# Patient Record
Sex: Male | Born: 1990 | Race: White | Hispanic: No | Smoking: Current every day smoker
Health system: Southern US, Community
[De-identification: ages and names within clinical notes are randomized; demographics above are authoritative.]

## PROBLEM LIST (undated history)

## (undated) DIAGNOSIS — B1920 Unspecified viral hepatitis C without hepatic coma: Secondary | ICD-10-CM

## (undated) DIAGNOSIS — J45909 Unspecified asthma, uncomplicated: Secondary | ICD-10-CM

## (undated) DIAGNOSIS — E11610 Type 2 diabetes mellitus with diabetic neuropathic arthropathy: Secondary | ICD-10-CM

## (undated) DIAGNOSIS — I10 Essential (primary) hypertension: Secondary | ICD-10-CM

## (undated) DIAGNOSIS — M86372 Chronic multifocal osteomyelitis, left ankle and foot: Secondary | ICD-10-CM

## (undated) DIAGNOSIS — N184 Chronic kidney disease, stage 4 (severe): Secondary | ICD-10-CM

## (undated) DIAGNOSIS — F1910 Other psychoactive substance abuse, uncomplicated: Secondary | ICD-10-CM

## (undated) DIAGNOSIS — A4902 Methicillin resistant Staphylococcus aureus infection, unspecified site: Secondary | ICD-10-CM

## (undated) DIAGNOSIS — Z87442 Personal history of urinary calculi: Secondary | ICD-10-CM

## (undated) DIAGNOSIS — N189 Chronic kidney disease, unspecified: Secondary | ICD-10-CM

## (undated) DIAGNOSIS — E1022 Type 1 diabetes mellitus with diabetic chronic kidney disease: Secondary | ICD-10-CM

## (undated) DIAGNOSIS — D649 Anemia, unspecified: Secondary | ICD-10-CM

## (undated) DIAGNOSIS — N2581 Secondary hyperparathyroidism of renal origin: Secondary | ICD-10-CM

## (undated) DIAGNOSIS — K219 Gastro-esophageal reflux disease without esophagitis: Secondary | ICD-10-CM

## (undated) DIAGNOSIS — E119 Type 2 diabetes mellitus without complications: Secondary | ICD-10-CM

## (undated) HISTORY — PX: APPENDECTOMY: SHX54

## (undated) HISTORY — PX: MYRINGOTOMY: SUR874

## (undated) HISTORY — DX: Chronic kidney disease, unspecified: N18.9

## (undated) HISTORY — PX: CYSTOSCOPY, WITH RETROGRADE PYELOGRAM, URETEROSCOPY, URINARY CALCULUS LASER LITHOTRIPSY, AND STENT INSERT: SHX7550

---

## 2013-10-31 DIAGNOSIS — E109 Type 1 diabetes mellitus without complications: Secondary | ICD-10-CM | POA: Insufficient documentation

## 2013-10-31 DIAGNOSIS — E1065 Type 1 diabetes mellitus with hyperglycemia: Secondary | ICD-10-CM | POA: Insufficient documentation

## 2016-08-15 DIAGNOSIS — Z87898 Personal history of other specified conditions: Secondary | ICD-10-CM | POA: Insufficient documentation

## 2016-08-15 DIAGNOSIS — F17209 Nicotine dependence, unspecified, with unspecified nicotine-induced disorders: Secondary | ICD-10-CM | POA: Insufficient documentation

## 2016-08-15 DIAGNOSIS — N201 Calculus of ureter: Secondary | ICD-10-CM | POA: Insufficient documentation

## 2017-08-30 HISTORY — PX: FOOT SURGERY: SHX648

## 2018-02-12 DIAGNOSIS — F1120 Opioid dependence, uncomplicated: Secondary | ICD-10-CM | POA: Insufficient documentation

## 2018-02-12 DIAGNOSIS — N179 Acute kidney failure, unspecified: Secondary | ICD-10-CM | POA: Insufficient documentation

## 2018-02-12 DIAGNOSIS — D649 Anemia, unspecified: Secondary | ICD-10-CM | POA: Insufficient documentation

## 2018-02-12 DIAGNOSIS — E08621 Diabetes mellitus due to underlying condition with foot ulcer: Secondary | ICD-10-CM | POA: Insufficient documentation

## 2018-05-25 DIAGNOSIS — B1920 Unspecified viral hepatitis C without hepatic coma: Secondary | ICD-10-CM | POA: Insufficient documentation

## 2018-05-26 DIAGNOSIS — D638 Anemia in other chronic diseases classified elsewhere: Secondary | ICD-10-CM | POA: Insufficient documentation

## 2018-05-26 DIAGNOSIS — I10 Essential (primary) hypertension: Secondary | ICD-10-CM | POA: Insufficient documentation

## 2018-05-26 DIAGNOSIS — E1042 Type 1 diabetes mellitus with diabetic polyneuropathy: Secondary | ICD-10-CM | POA: Insufficient documentation

## 2018-05-26 DIAGNOSIS — N189 Chronic kidney disease, unspecified: Secondary | ICD-10-CM | POA: Insufficient documentation

## 2019-01-03 DIAGNOSIS — M86172 Other acute osteomyelitis, left ankle and foot: Secondary | ICD-10-CM | POA: Insufficient documentation

## 2019-01-03 DIAGNOSIS — L089 Local infection of the skin and subcutaneous tissue, unspecified: Secondary | ICD-10-CM | POA: Insufficient documentation

## 2022-02-14 ENCOUNTER — Inpatient Hospital Stay
Admission: EM | Admit: 2022-02-14 | Discharge: 2022-02-17 | DRG: 074 | Disposition: A | Discharge status: Home / Self Care | Payer: Commercial Managed Care - HMO | Attending: Obstetrics and Gynecology | Admitting: Obstetrics and Gynecology

## 2022-02-14 ENCOUNTER — Emergency Department: Payer: Commercial Managed Care - HMO

## 2022-02-14 ENCOUNTER — Encounter: Payer: Self-pay | Admitting: Medical Oncology

## 2022-02-14 DIAGNOSIS — E1065 Type 1 diabetes mellitus with hyperglycemia: Secondary | ICD-10-CM | POA: Diagnosis present

## 2022-02-14 DIAGNOSIS — G8929 Other chronic pain: Secondary | ICD-10-CM | POA: Diagnosis present

## 2022-02-14 DIAGNOSIS — F1490 Cocaine use, unspecified, uncomplicated: Secondary | ICD-10-CM | POA: Diagnosis present

## 2022-02-14 DIAGNOSIS — Z881 Allergy status to other antibiotic agents status: Secondary | ICD-10-CM

## 2022-02-14 DIAGNOSIS — N179 Acute kidney failure, unspecified: Secondary | ICD-10-CM | POA: Diagnosis present

## 2022-02-14 DIAGNOSIS — E1043 Type 1 diabetes mellitus with diabetic autonomic (poly)neuropathy: Principal | ICD-10-CM | POA: Diagnosis present

## 2022-02-14 DIAGNOSIS — M86372 Chronic multifocal osteomyelitis, left ankle and foot: Secondary | ICD-10-CM | POA: Diagnosis present

## 2022-02-14 DIAGNOSIS — Z794 Long term (current) use of insulin: Secondary | ICD-10-CM

## 2022-02-14 DIAGNOSIS — F1120 Opioid dependence, uncomplicated: Secondary | ICD-10-CM | POA: Diagnosis present

## 2022-02-14 DIAGNOSIS — Z720 Tobacco use: Secondary | ICD-10-CM | POA: Diagnosis present

## 2022-02-14 DIAGNOSIS — K3184 Gastroparesis: Secondary | ICD-10-CM | POA: Diagnosis present

## 2022-02-14 DIAGNOSIS — E10610 Type 1 diabetes mellitus with diabetic neuropathic arthropathy: Secondary | ICD-10-CM | POA: Diagnosis present

## 2022-02-14 DIAGNOSIS — E43 Unspecified severe protein-calorie malnutrition: Secondary | ICD-10-CM | POA: Diagnosis present

## 2022-02-14 DIAGNOSIS — D631 Anemia in chronic kidney disease: Secondary | ICD-10-CM

## 2022-02-14 DIAGNOSIS — N184 Chronic kidney disease, stage 4 (severe): Secondary | ICD-10-CM | POA: Diagnosis present

## 2022-02-14 DIAGNOSIS — E1022 Type 1 diabetes mellitus with diabetic chronic kidney disease: Secondary | ICD-10-CM | POA: Diagnosis present

## 2022-02-14 DIAGNOSIS — R0789 Other chest pain: Secondary | ICD-10-CM | POA: Diagnosis present

## 2022-02-14 DIAGNOSIS — N189 Chronic kidney disease, unspecified: Secondary | ICD-10-CM | POA: Diagnosis present

## 2022-02-14 DIAGNOSIS — Z882 Allergy status to sulfonamides status: Secondary | ICD-10-CM

## 2022-02-14 DIAGNOSIS — R112 Nausea with vomiting, unspecified: Secondary | ICD-10-CM | POA: Diagnosis present

## 2022-02-14 DIAGNOSIS — R739 Hyperglycemia, unspecified: Secondary | ICD-10-CM

## 2022-02-14 DIAGNOSIS — I129 Hypertensive chronic kidney disease with stage 1 through stage 4 chronic kidney disease, or unspecified chronic kidney disease: Secondary | ICD-10-CM | POA: Diagnosis present

## 2022-02-14 DIAGNOSIS — E109 Type 1 diabetes mellitus without complications: Secondary | ICD-10-CM | POA: Diagnosis present

## 2022-02-14 DIAGNOSIS — F17210 Nicotine dependence, cigarettes, uncomplicated: Secondary | ICD-10-CM | POA: Diagnosis present

## 2022-02-14 DIAGNOSIS — E871 Hypo-osmolality and hyponatremia: Secondary | ICD-10-CM | POA: Diagnosis present

## 2022-02-14 DIAGNOSIS — M868X7 Other osteomyelitis, ankle and foot: Principal | ICD-10-CM

## 2022-02-14 DIAGNOSIS — F1290 Cannabis use, unspecified, uncomplicated: Secondary | ICD-10-CM | POA: Diagnosis present

## 2022-02-14 DIAGNOSIS — M14672 Charcot's joint, left ankle and foot: Secondary | ICD-10-CM

## 2022-02-14 DIAGNOSIS — E1069 Type 1 diabetes mellitus with other specified complication: Secondary | ICD-10-CM

## 2022-02-14 DIAGNOSIS — F1190 Opioid use, unspecified, uncomplicated: Secondary | ICD-10-CM

## 2022-02-14 DIAGNOSIS — E875 Hyperkalemia: Secondary | ICD-10-CM | POA: Diagnosis present

## 2022-02-14 HISTORY — DX: Nausea with vomiting, unspecified: R11.2

## 2022-02-14 HISTORY — DX: Essential (primary) hypertension: I10

## 2022-02-14 HISTORY — DX: Type 2 diabetes mellitus without complications: E11.9

## 2022-02-14 LAB — CBC WITH DIFFERENTIAL/PLATELET
Abs Immature Granulocytes: 0.06 10*3/uL (ref 0.00–0.07)
Basophils Absolute: 0 10*3/uL (ref 0.0–0.1)
Basophils Relative: 0 %
Eosinophils Absolute: 0 10*3/uL (ref 0.0–0.5)
Eosinophils Relative: 0 %
HCT: 31.7 % — ABNORMAL LOW (ref 39.0–52.0)
Hemoglobin: 9.7 g/dL — ABNORMAL LOW (ref 13.0–17.0)
Immature Granulocytes: 1 %
Lymphocytes Relative: 12 %
Lymphs Abs: 1.1 10*3/uL (ref 0.7–4.0)
MCH: 25.9 pg — ABNORMAL LOW (ref 26.0–34.0)
MCHC: 30.6 g/dL (ref 30.0–36.0)
MCV: 84.8 fL (ref 80.0–100.0)
Monocytes Absolute: 0.3 10*3/uL (ref 0.1–1.0)
Monocytes Relative: 3 %
Neutro Abs: 8.1 10*3/uL — ABNORMAL HIGH (ref 1.7–7.7)
Neutrophils Relative %: 84 %
Platelets: 384 10*3/uL (ref 150–400)
RBC: 3.74 MIL/uL — ABNORMAL LOW (ref 4.22–5.81)
RDW: 14.8 % (ref 11.5–15.5)
WBC: 9.7 10*3/uL (ref 4.0–10.5)
nRBC: 0 % (ref 0.0–0.2)

## 2022-02-14 LAB — URINALYSIS, ROUTINE W REFLEX MICROSCOPIC
Bilirubin Urine: NEGATIVE
Glucose, UA: >=500 mg/dL — AB
Ketones, ur: NEGATIVE mg/dL
Nitrite: NEGATIVE
Protein, ur: 100 mg/dL — AB
Specific Gravity, Urine: 1.007 (ref 1.005–1.030)
Squamous Epithelial / HPF: NONE SEEN (ref 0–5)
pH: 7 (ref 5.0–8.0)

## 2022-02-14 LAB — CBG MONITORING, ED
Glucose-Capillary: 343 mg/dL — ABNORMAL HIGH (ref 70–99)
Glucose-Capillary: 303 mg/dL — ABNORMAL HIGH (ref 70–99)
Glucose-Capillary: 217 mg/dL — ABNORMAL HIGH (ref 70–99)

## 2022-02-14 LAB — COMPREHENSIVE METABOLIC PANEL
ALT: 24 U/L (ref 0–44)
AST: 32 U/L (ref 15–41)
Albumin: 3.2 g/dL — ABNORMAL LOW (ref 3.5–5.0)
Alkaline Phosphatase: 132 U/L — ABNORMAL HIGH (ref 38–126)
Anion gap: 12 (ref 5–15)
BUN: 40 mg/dL — ABNORMAL HIGH (ref 6–20)
CO2: 19 mmol/L — ABNORMAL LOW (ref 22–32)
Calcium: 8.9 mg/dL (ref 8.9–10.3)
Chloride: 100 mmol/L (ref 98–111)
Creatinine, Ser: 3.74 mg/dL — ABNORMAL HIGH (ref 0.61–1.24)
GFR, Estimated: 21 mL/min — ABNORMAL LOW (ref >60)
Glucose, Bld: 375 mg/dL — ABNORMAL HIGH (ref 70–99)
Potassium: 5.4 mmol/L — ABNORMAL HIGH (ref 3.5–5.1)
Sodium: 131 mmol/L — ABNORMAL LOW (ref 135–145)
Total Bilirubin: 0.7 mg/dL (ref 0.3–1.2)
Total Protein: 9.9 g/dL — ABNORMAL HIGH (ref 6.5–8.1)

## 2022-02-14 LAB — URINALYSIS, COMPLETE (UACMP) WITH MICROSCOPIC
Bilirubin Urine: NEGATIVE
Glucose, UA: >=500 mg/dL — AB
Ketones, ur: NEGATIVE mg/dL
Nitrite: NEGATIVE
Protein, ur: 100 mg/dL — AB
Specific Gravity, Urine: 1.007 (ref 1.005–1.030)
Squamous Epithelial / HPF: NONE SEEN (ref 0–5)
pH: 7 (ref 5.0–8.0)

## 2022-02-14 LAB — URINE DRUG SCREEN, QUALITATIVE (ARMC ONLY)
Amphetamines, Ur Screen: NOT DETECTED
Barbiturates, Ur Screen: NOT DETECTED
Benzodiazepine, Ur Scrn: NOT DETECTED
Cannabinoid 50 Ng, Ur ~~LOC~~: POSITIVE — AB
Cocaine Metabolite,Ur ~~LOC~~: POSITIVE — AB
MDMA (Ecstasy)Ur Screen: NOT DETECTED
Methadone Scn, Ur: POSITIVE — AB
Opiate, Ur Screen: NOT DETECTED
Phencyclidine (PCP) Ur S: NOT DETECTED
Tricyclic, Ur Screen: NOT DETECTED

## 2022-02-14 LAB — C-REACTIVE PROTEIN: CRP: 2.9 mg/dL — ABNORMAL HIGH (ref <1.0)

## 2022-02-14 LAB — LIPASE, BLOOD: Lipase: 23 U/L (ref 11–51)

## 2022-02-14 LAB — SEDIMENTATION RATE: Sed Rate: 109 mm/hr — ABNORMAL HIGH (ref 0–16)

## 2022-02-14 LAB — PROCALCITONIN: Procalcitonin: 0.25 ng/mL

## 2022-02-14 LAB — MRSA NEXT GEN BY PCR, NASAL: MRSA by PCR Next Gen: NOT DETECTED

## 2022-02-14 LAB — LACTIC ACID, PLASMA: Lactic Acid, Venous: 2.1 mmol/L (ref 0.5–1.9)

## 2022-02-14 LAB — TROPONIN I (HIGH SENSITIVITY)
Troponin I (High Sensitivity): 7 ng/L (ref <18)
Troponin I (High Sensitivity): 8 ng/L (ref <18)

## 2022-02-14 MED ORDER — ONDANSETRON HCL 4 MG PO TABS: 4.0000 mg | ORAL_TABLET | Freq: Four times a day (QID) | ORAL | Status: AC | PRN

## 2022-02-14 MED ORDER — PIPERACILLIN-TAZOBACTAM 3.375 G IVPB 30 MIN
3.3750 g | Freq: Once | INTRAVENOUS | Status: DC
  Filled 2022-02-14: qty 50

## 2022-02-14 MED ORDER — METHADONE HCL 10 MG PO TABS
90.0000 mg | ORAL_TABLET | Freq: Once | ORAL | Status: DC
  Filled 2022-02-14: qty 9

## 2022-02-14 MED ORDER — METHADONE HCL 10 MG PO TABS
40.0000 mg | ORAL_TABLET | Freq: Once | ORAL | Status: AC
  Administered 2022-02-14: 40 mg via ORAL

## 2022-02-14 MED ORDER — LACTATED RINGERS IV BOLUS
1000.0000 mL | Freq: Once | INTRAVENOUS | Status: AC
  Administered 2022-02-14: 1000 mL via INTRAVENOUS

## 2022-02-14 MED ORDER — INSULIN ASPART 100 UNIT/ML IJ SOLN
0.0000 [IU] | Freq: Three times a day (TID) | INTRAMUSCULAR | Status: DC
  Administered 2022-02-14: 7 [IU] via SUBCUTANEOUS
  Administered 2022-02-15: 3 [IU] via SUBCUTANEOUS
  Administered 2022-02-15: 5 [IU] via SUBCUTANEOUS
  Administered 2022-02-15: 3 [IU] via SUBCUTANEOUS
  Filled 2022-02-14 (×3): qty 1

## 2022-02-14 MED ORDER — ONDANSETRON HCL 4 MG/2ML IJ SOLN
4.0000 mg | Freq: Four times a day (QID) | INTRAMUSCULAR | Status: AC | PRN
  Administered 2022-02-14 – 2022-02-16 (×5): 4 mg via INTRAVENOUS
  Filled 2022-02-14 (×5): qty 2

## 2022-02-14 MED ORDER — METOCLOPRAMIDE HCL 5 MG/ML IJ SOLN
10.0000 mg | Freq: Once | INTRAMUSCULAR | Status: AC
  Administered 2022-02-14: 10 mg via INTRAVENOUS
  Filled 2022-02-14: qty 2

## 2022-02-14 MED ORDER — PROMETHAZINE HCL 25 MG/ML IJ SOLN
25.0000 mg | Freq: Once | INTRAMUSCULAR | Status: AC
  Administered 2022-02-14: 25 mg via INTRAMUSCULAR
  Filled 2022-02-14 (×2): qty 1

## 2022-02-14 MED ORDER — HEPARIN SODIUM (PORCINE) 5000 UNIT/ML IJ SOLN
5000.0000 [IU] | Freq: Three times a day (TID) | INTRAMUSCULAR | Status: AC
  Filled 2022-02-14: qty 1

## 2022-02-14 MED ORDER — ACETAMINOPHEN 650 MG RE SUPP: 650.0000 mg | Freq: Four times a day (QID) | RECTAL | Status: DC | PRN

## 2022-02-14 MED ORDER — SODIUM CHLORIDE 0.9 % IV BOLUS
1000.0000 mL | Freq: Once | INTRAVENOUS | Status: AC
  Administered 2022-02-14: 1000 mL via INTRAVENOUS

## 2022-02-14 MED ORDER — ACETAMINOPHEN 325 MG PO TABS: 650.0000 mg | ORAL_TABLET | Freq: Four times a day (QID) | ORAL | Status: DC | PRN

## 2022-02-14 MED ORDER — VANCOMYCIN HCL 1500 MG/300ML IV SOLN
1500.0000 mg | Freq: Once | INTRAVENOUS | Status: AC
  Administered 2022-02-14: 1500 mg via INTRAVENOUS
  Filled 2022-02-14: qty 300

## 2022-02-14 MED ORDER — NICOTINE 21 MG/24HR TD PT24: 21.0000 mg | MEDICATED_PATCH | Freq: Every day | TRANSDERMAL | Status: DC | PRN

## 2022-02-14 MED ORDER — INSULIN ASPART 100 UNIT/ML IJ SOLN
0.0000 [IU] | Freq: Every day | INTRAMUSCULAR | Status: DC
  Administered 2022-02-14: 2 [IU] via SUBCUTANEOUS
  Filled 2022-02-14: qty 1

## 2022-02-14 MED ORDER — SODIUM CHLORIDE 0.9 % IV SOLN
2.0000 g | INTRAVENOUS | Status: DC
  Administered 2022-02-14 – 2022-02-15 (×2): 2 g via INTRAVENOUS
  Filled 2022-02-14 (×3): qty 12.5

## 2022-02-14 MED ORDER — ONDANSETRON 4 MG PO TBDP
4.0000 mg | ORAL_TABLET | Freq: Once | ORAL | Status: AC
Start: 2022-02-14 — End: 2022-02-14
  Administered 2022-02-14: 4 mg via ORAL
  Filled 2022-02-14: qty 1

## 2022-02-14 MED ORDER — HYDROMORPHONE HCL 1 MG/ML IJ SOLN
0.5000 mg | INTRAMUSCULAR | Status: DC | PRN
  Administered 2022-02-15: 0.5 mg via INTRAVENOUS
  Filled 2022-02-14: qty 0.5

## 2022-02-14 MED ORDER — HYDRALAZINE HCL 25 MG PO TABS
25.0000 mg | ORAL_TABLET | Freq: Four times a day (QID) | ORAL | Status: DC | PRN
  Administered 2022-02-14 – 2022-02-15 (×4): 25 mg via ORAL
  Filled 2022-02-14 (×4): qty 1

## 2022-02-14 MED ORDER — METOCLOPRAMIDE HCL 5 MG/ML IJ SOLN
10.0000 mg | Freq: Three times a day (TID) | INTRAMUSCULAR | Status: DC | PRN
Start: 2022-02-14 — End: 2022-02-17
  Administered 2022-02-14 – 2022-02-16 (×4): 10 mg via INTRAVENOUS
  Filled 2022-02-14 (×4): qty 2

## 2022-02-14 MED ORDER — MORPHINE SULFATE (PF) 2 MG/ML IV SOLN
2.0000 mg | INTRAVENOUS | Status: DC | PRN
  Administered 2022-02-15: 2 mg via INTRAVENOUS
  Filled 2022-02-14: qty 1

## 2022-02-14 MED ORDER — METOPROLOL TARTRATE 5 MG/5ML IV SOLN: 5.0000 mg | INTRAVENOUS | Status: DC | PRN

## 2022-02-14 MED ORDER — SODIUM CHLORIDE 0.9 % IV SOLN: INTRAVENOUS | Status: AC

## 2022-02-14 NOTE — Assessment & Plan Note (Signed)
-   Presumed secondary to AKI - Treat with fluid as above - BMP in a.m.

## 2022-02-14 NOTE — ED Notes (Signed)
Unable to obtain 2nd set of blood cultures at this time due to difficult stick.

## 2022-02-14 NOTE — ED Triage Notes (Signed)
Pt here with mother, pt reports that he began having vomiting this am with some chest pain.

## 2022-02-14 NOTE — ED Notes (Signed)
Pt c/o of nausea and vomitting. Medication will be given

## 2022-02-14 NOTE — Assessment & Plan Note (Addendum)
-   Query gastroparesis versus gastroenteritis - Intractable - Lipase was negative - Symptomatic support with ondansetron as needed; Reglan for 10 mg IV for refractory nausea and vomiting - If the symptoms do not resolve, would recommend a barium swallow study either inpatient or outpatient to assess for gastroparesis

## 2022-02-14 NOTE — ED Notes (Signed)
Informed RN bed assigned 

## 2022-02-14 NOTE — Assessment & Plan Note (Signed)
Nicotine patch ordered.

## 2022-02-14 NOTE — H&P (Addendum)
History and Physical   Jason Francis CHE:[id number] DOB: 1990-10-28 DOA: 02/14/2022  PCP: Pcp, No  Patient coming from: home  I have personally briefly reviewed patient's old medical records in [place].  Chief Concern: nausea and vomiting  HPI: Mr. Jason Francis is a 31 year old male with history of insulin-dependent diabetes mellitus type 1, who presented to the emergency department for chief concerns of nausea and vomiting.  Initial vitals in the emergency department showed temperature of 97.7, respiration rate of 20, heart rate of 77, initial blood pressure 212/114, improved to 197/102, SPO2 of 100% on room air.  Serum sodium is 131, potassium 5.4, chloride 100, bicarb 19, BUN of 40, serum creatinine of 3.74, nonfasting blood glucose 175, GFR of 21.  WBC 9.7, hemoglobin 9.7, platelets of 384.  UA was positive for leukocytes and nitrates.  ED treatment: Vancomycin, Zosyn IV, Reglan 10 mg IV, ondansetron 4 mg p.o., sodium chloride 2 L bolus, LR 1 L bolus.  At bedside patient was able to tell me his name, age, he knows the current calendar year and he knows his mother is at bedside.  He reports that he is coming in for nausea and vomiting that happened today.  He does not know what he is vomiting up.  He is minimally verbal and does not want to make eye contact with me.  He prefers that his mother talk for him.  He appears mildly impatient and agitated at my questions in order to complete the HPI.  He does not appear to be in acute distress.  Patient and family denies anybody else getting sick.  They just got back from the beach and had seafood though no one in the family had nausea or vomiting.  He denies dysuria, diarrhea.  He reports that his left ankle has been bothering him for a long time now nearly a year and was advised to have it amputated however they were not ready at that time.  Patient endorses requests to have orthopedic evaluation for second opinion.  He also  endorses subjective fever at home though they were not able to tell me the temperature.  Social history: He is a current daily tobacco user smoking 1 to 2 packs/day.  He denies alcohol use.  He endorses marijuana and cocaine use.  He states he last injected cocaine on 02/13/2022.  ROS: Unable to complete as patient did not want to participate in HPI  ED Course: Discussed with emergency medicine provider, patient requiring hospitalization for chief concerns of acute kidney injury and osteomyelitis.  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)   Insulin dependent type 1 diabetes mellitus (HCC)   AKI (acute kidney injury) ([place])   Protein-calorie malnutrition, severe (HCC)   Hyponatremia   Cocaine use   Tobacco use   Hyperkalemia   Assessment and Plan:  * Nausea & vomiting - Query gastroparesis versus gastroenteritis - Intractable - Lipase was negative - Symptomatic support with ondansetron as needed; Reglan for 10 mg IV for refractory nausea and vomiting - If the symptoms do not resolve, would recommend a barium swallow study either inpatient or outpatient to assess for gastroparesis  Hyperkalemia - Presumed secondary to AKI - Treat with fluid as above - BMP in a.m.  Tobacco use - Nicotine patch ordered  Cocaine use - Intravenous - And patient endorses fever - Check blood cultures  Hyponatremia - Secondary to hyperglycemia - Corrected serum  sodium is 138, ([place], 1999)  Protein-calorie malnutrition, severe ([place]) - Moderate to severe  AKI (acute kidney injury) ([place]) Likely prerenal secondary to GI loss versus CKD stage IV - Serum creatinine on presentation is 3.74 and eGFR is 21 - There is no baseline in epic and in care everywhere for comparison - Status post sodium chloride bolus 1 L and LR 1 L per EDP - Ordered sodium chloride 125 mL/h, 1 day ordered - BMP in the  a.m.  Insulin dependent type 1 diabetes mellitus (HCC) - Insulin SSI with at bedtime coverage, renal dosing ordered - Patient states that at home he takes insulin short acting, 3 to 4 units, 3 times per day - Check A1c in the a.m. 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 Sharlet Salina  Chart reviewed.   DVT prophylaxis: Heparin 5000 units subcutaneous every 8 hours, 2 doses ordered Code Status: Full code Diet: Heart healthy/carb modified Family Communication: Updated mother and fianc at bedside with patient's permission Disposition Plan: Pending clinical course Consults called: Orthopedic provider Admission status: MedSurg, observation  Past Medical History:  Diagnosis Date   Diabetes mellitus without complication ([place])    Hypertension    History reviewed. No pertinent surgical history.  Social History:  reports that he has been smoking cigarettes. He has been smoking an average of 2 packs per day. He has never used smokeless tobacco. He reports that he does not currently use alcohol. He reports current drug use. Drugs: Marijuana and Cocaine.  Allergies  Allergen Reactions   Augmentin [Amoxicillin-Pot Clavulanate] Nausea And Vomiting   Sulfa Antibiotics Nausea And Vomiting   Family History  Problem Relation Age of Onset   Obesity Mother    Family history: Family history reviewed and not pertinent.  Prior to Admission medications   Medication Sig Start Date End Date Taking? Authorizing Provider  insulin regular (NOVOLIN R) 100 units/mL injection Inject 4-5 Units into the skin 3 (three) times daily before meals.   Yes [provider]   Physical Exam: Vitals:   02/14/22 1031 02/14/22  1032 02/14/22 1458 02/14/22 1500  BP: (!) 212/114  (!) 197/102 (!) 189/105  Pulse:   90 96  Resp:   16   Temp:      TempSrc:      SpO2:   99% 99%  Weight:  77 kg    Height:  $Remove'5\' 8"'DgWEfWG$  (1.727 m)     Constitutional: appears older than chronological age, NAD, calm, comfortable Eyes: PERRL, lids and conjunctivae normal ENMT: Mucous membranes are moist. Posterior pharynx clear of any exudate or lesions.  Poor dentition. Hearing appropriate Neck: normal, supple, no masses, no thyromegaly Respiratory: clear to auscultation bilaterally, no wheezing, no crackles. Normal respiratory effort. No accessory muscle use.  Cardiovascular: Regular rate and rhythm, no murmurs / rubs / gallops. No extremity edema. 2+ pedal pulses. No carotid bruits.  Abdomen: no tenderness, no masses palpated, no hepatosplenomegaly. Bowel sounds positive.  Musculoskeletal: no clubbing / cyanosis. No joint deformity upper and lower extremities. Good ROM except for the left ankle, no contractures, no atrophy. Normal muscle tone.      Skin: no rashes, lesions, ulcers. No induration Neurologic: Sensation intact. Strength 5/5 in all 4.  Psychiatric:  Normal judgment and insight. Alert and oriented x 3. Normal mood.   EKG: independently reviewed, showing sinus rhythm with rate of 69, QTc 422  Chest x-ray on Admission: I personally reviewed and I agree with radiologist reading as below.  DG Ankle Complete Left  Result Date: 02/14/2022 CLINICAL DATA:  Diabetic with swelling EXAM: LEFT ANKLE COMPLETE - 3+ VIEW; LEFT FOOT - COMPLETE 3+ VIEW COMPARISON:  None Available. FINDINGS: There is extensive marked bone destruction of the distal tibia, fibula and talus with complete obliteration of the ankle joint. Similar findings of bone destruction and distortion throughout the midfoot involving the anterior half of the calcaneus to the proximal metatarsals. Extensive associated callus formation/periosteal reaction about the ankle. Severe soft  tissue swelling with deformity of the ankle and foot. Arterial vascular calcifications noted. 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 Electronically Signed   By: Ofilia Neas M.D.   On: 02/14/2022 13:13   DG Foot Complete Left  Result Date: 02/14/2022 CLINICAL DATA:  Diabetic with swelling EXAM: LEFT ANKLE COMPLETE - 3+ VIEW; LEFT FOOT - COMPLETE 3+ VIEW COMPARISON:  None Available. FINDINGS: There is extensive marked bone destruction of the distal tibia, fibula and talus with complete obliteration of the ankle joint. Similar findings of bone destruction and distortion throughout the midfoot involving the anterior half of the calcaneus to the proximal metatarsals. Extensive associated callus formation/periosteal reaction about the ankle. Severe soft tissue swelling with deformity of the ankle and foot. Arterial vascular calcifications noted. 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 Electronically Signed   By: Ofilia Neas M.D.   On: 02/14/2022 13:13   DG Chest Portable 1 View  Result Date: 02/14/2022 CLINICAL DATA:  31 year old male with history of chest pain and shortness of breath. EXAM: PORTABLE CHEST 1 VIEW COMPARISON:  No priors. FINDINGS: Lung volumes are low. Diffuse peribronchial cuffing. No consolidative airspace disease. No pleural effusions. No pneumothorax. No pulmonary nodule or mass noted. Pulmonary vasculature and the cardiomediastinal silhouette are within normal limits. IMPRESSION: 1. Diffuse peribronchial cuffing which may suggest an acute bronchitis. Electronically Signed   By: Vinnie Langton M.D.   On: 02/14/2022 13:00    Labs on Admission: I have personally reviewed following labs  CBC: Recent Labs  Lab 02/14/22 1102  WBC 9.7  NEUTROABS 8.1*  HGB 9.7*  HCT 31.7*  MCV 84.8  PLT 834   Basic Metabolic Panel: Recent Labs  Lab 02/14/22 1102  NA 131*  K  5.4*  CL 100  CO2 19*  GLUCOSE 375*  BUN 40*  CREATININE 3.74*  CALCIUM 8.9   GFR: Estimated Creatinine Clearance: 27.4 mL/min (A) (by C-G formula based on SCr of 3.74 mg/dL (H)).  Liver Function Tests: Recent Labs  Lab 02/14/22 1102  AST 32  ALT 24  ALKPHOS 132*  BILITOT 0.7  PROT 9.9*  ALBUMIN 3.2*   Recent Labs  Lab 02/14/22 1102  LIPASE 23   CBG: Recent Labs  Lab 02/14/22 1031 02/14/22 1631  GLUCAP 343* 303*   Urine analysis:    Component Value Date/Time   COLORURINE STRAW (A) 02/14/2022 1413   APPEARANCEUR CLEAR (A) 02/14/2022 1413   LABSPEC 1.007 02/14/2022 1413   PHURINE 7.0 02/14/2022 1413   GLUCOSEU >=500 (A) 02/14/2022 1413   HGBUR MODERATE (A) 02/14/2022 1413   BILIRUBINUR NEGATIVE 02/14/2022 1413   KETONESUR NEGATIVE 02/14/2022 1413   PROTEINUR 100 (A) 02/14/2022 1413  NITRITE NEGATIVE 02/14/2022 1413   LEUKOCYTESUR TRACE (A) 02/14/2022 1413   Dr. Tobie Poet [facility]  If 7PM-7AM, please contact overnight-coverage provider If 7AM-7PM, please contact day coverage provider [email or web address]  02/14/2022, 6:13 PM

## 2022-02-14 NOTE — Progress Notes (Signed)
Secure chat with Minette Brine RN and Erlene Quan RN re 2nd PIV site.  Determined it is not needed at this time.

## 2022-02-14 NOTE — Assessment & Plan Note (Signed)
-   Moderate to severe

## 2022-02-14 NOTE — Hospital Course (Addendum)
Jason Francis is a 31 year old male with history of insulin-dependent diabetes mellitus type 1, who presented to the emergency department for chief concerns of nausea and vomiting.  Initial vitals in the emergency department showed temperature of 97.7, respiration rate of 20, heart rate of 77, initial blood pressure 212/114, improved to 197/102, SPO2 of 100% on room air.  Serum sodium is 131, potassium 5.4, chloride 100, bicarb 19, BUN of 40, serum creatinine of 3.74, nonfasting blood glucose 175, GFR of 21.  WBC 9.7, hemoglobin 9.7, platelets of 384.  UA was positive for leukocytes and nitrates.  ED treatment: Vancomycin, Zosyn IV, Reglan 10 mg IV, ondansetron 4 mg p.o., sodium chloride 2 L bolus, LR 1 L bolus.

## 2022-02-14 NOTE — Assessment & Plan Note (Addendum)
Likely prerenal secondary to GI loss versus CKD stage IV - Serum creatinine on presentation is 3.74 and eGFR is 21 - There is no baseline in epic and in care everywhere for comparison - Status post sodium chloride bolus 1 L and LR 1 L per EDP - Ordered sodium chloride 125 mL/h, 1 day ordered - BMP in the a.m.

## 2022-02-14 NOTE — Assessment & Plan Note (Addendum)
-   Insulin SSI with at bedtime coverage, renal dosing ordered - Patient states that at home he takes insulin short acting, 3 to 4 units, 3 times per day - Check A1c in the a.m. - Goal inpatient blood glucose levels 140-180

## 2022-02-14 NOTE — Assessment & Plan Note (Signed)
-   Intravenous - And patient endorses fever - Check blood cultures

## 2022-02-14 NOTE — Consult Note (Signed)
ORTHOPAEDIC CONSULTATION  REQUESTING PHYSICIAN: Cox, Amy N, DO  Chief Complaint: left foot pain/deformity  HPI: Jason Francis is a 31 y.o. male who is being admitted for nausea and vomiting.   Orthopedics is being consulted regarding complaints of chronic left foot pain. The pain is sharp in character. Patient ambulates with crutches.  Per report from the patient's family, the patient was previously seen by an orthopedic provider at [facility] who recommended amputation. He has not had any redness, excessive swelling, or drainage for over 6 months.  Past Medical History:  Diagnosis Date   Diabetes mellitus without complication ([place])    Hypertension    History reviewed. No pertinent surgical history. Social History   Socioeconomic History   Marital status: Single    Spouse name: Not on file   Number of children: Not on file   Years of education: Not on file   Highest education level: Not on file  Occupational History   Not on file  Tobacco Use   Smoking status: Every Day    Packs/day: 2.00    Types: Cigarettes   Smokeless tobacco: Never  Substance and Sexual Activity   Alcohol use: Not Currently   Drug use: Yes    Types: Marijuana, Cocaine    Comment: patient states he last injected cocaine 02/13/22   Sexual activity: Yes    Partners: Female  Other Topics Concern   Not on file  Social History Narrative   Not on file   Social Determinants of Health   Financial Resource Strain: Not on file  Food Insecurity: Not on file  Transportation Needs: Not on file  Physical Activity: Not on file  Stress: Not on file  Social Connections: Not on file   Family History  Problem Relation Age of Onset   Obesity Mother    Allergies  Allergen Reactions   Augmentin [Amoxicillin-Pot Clavulanate] Nausea And Vomiting   Sulfa Antibiotics Nausea And Vomiting   Prior to Admission medications   Medication Sig Start Date End Date Taking? Authorizing Provider  insulin regular  (NOVOLIN R) 100 units/mL injection Inject 4-5 Units into the skin 3 (three) times daily before meals.   Yes [provider]   DG Ankle Complete Left  Result Date: 02/14/2022 CLINICAL DATA:  Diabetic with swelling EXAM: LEFT ANKLE COMPLETE - 3+ VIEW; LEFT FOOT - COMPLETE 3+ VIEW COMPARISON:  None Available. FINDINGS: There is extensive marked bone destruction of the distal tibia, fibula and talus with complete obliteration of the ankle joint. Similar findings of bone destruction and distortion throughout the midfoot involving the anterior half of the calcaneus to the proximal metatarsals. Extensive associated callus formation/periosteal reaction about the ankle. Severe soft tissue swelling with deformity of the ankle and foot. Arterial vascular calcifications noted. 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 Electronically Signed   By: Ofilia Neas M.D.   On: 02/14/2022 13:13   DG Foot Complete Left  Result Date: 02/14/2022 CLINICAL DATA:  Diabetic with swelling EXAM: LEFT ANKLE COMPLETE - 3+ VIEW; LEFT FOOT - COMPLETE 3+ VIEW COMPARISON:  None Available. FINDINGS: There is extensive marked bone destruction of the distal tibia, fibula and talus with complete obliteration of the ankle joint. Similar findings of bone destruction and distortion throughout the midfoot involving the anterior half of the calcaneus to the proximal metatarsals. Extensive associated callus formation/periosteal reaction about the ankle. Severe soft tissue swelling with deformity of the ankle and foot. Arterial vascular calcifications noted.  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 Electronically Signed   By: Ofilia Neas M.D.   On: 02/14/2022 13:13   DG Chest Portable 1 View  Result Date: 02/14/2022 CLINICAL DATA:  31 year old male with history of chest pain and shortness of breath. EXAM: PORTABLE CHEST 1  VIEW COMPARISON:  No priors. FINDINGS: Lung volumes are low. Diffuse peribronchial cuffing. No consolidative airspace disease. No pleural effusions. No pneumothorax. No pulmonary nodule or mass noted. Pulmonary vasculature and the cardiomediastinal silhouette are within normal limits. IMPRESSION: 1. Diffuse peribronchial cuffing which may suggest an acute bronchitis. Electronically Signed   By: Vinnie Langton M.D.   On: 02/14/2022 13:00    Positive ROS: All other systems have been reviewed and were otherwise negative with the exception of those mentioned in the HPI and as above.  Physical Exam: General: Alert, no acute distress, not interactive Cardiovascular: No pedal edema Respiratory: No cyanosis, no use of accessory musculature GI: No organomegaly, abdomen is soft and non-tender Skin: No lesions in the area of chief complaint Neurologic: Sensation intact distally Psychiatric: Patient is competent for consent with normal mood and affect Lymphatic: No axillary or cervical lymphadenopathy  MUSCULOSKELETAL:  Obvious deformity of the left foot/ankle, skin is intact, no swelling, no tenderness to palpation, able to dorsiflex/plantarflex the ankle, wiggles toes appropriately, endorses sensation throughout the foot  Assessment: 31yo male with IDDM with chronic left foot/ankle pain secondary to Charcot arthropathy  Plan: I discussed with the family that the patient's deformity is related to secondary effects from his diabetes. At this time, he does not appear to have any active infection. Per family report, there may have been a history of infection/osteomyelitis. Regardless, his left foot pain is not an urgent concern with respect to his admission and overall presentation. I recommend that he follow-up on an outpatient basis with podiatry or a foot and ankle specialist with expertise in Charcot arthropathy for discussion of further treatment options.    Renee Harder,  MD    02/14/2022 8:17 PM

## 2022-02-14 NOTE — Assessment & Plan Note (Addendum)
-   Check MRSA PCR, sed rate, CRP - EDP ordered vancomycin and Zosyn - Given patient's renal function, I have discontinued Zosyn - Continue Vanco and cefepime - Orthopedic has been consulted at patient and family's request, Dr. Sharlet Salina

## 2022-02-14 NOTE — ED Provider Triage Note (Signed)
Emergency Medicine Provider Triage Evaluation Note  Pax Reasoner , a 31 y.o. male  was evaluated in triage.  Pt complains of vomiting, cp, infection to foot, hx diabetes on insulin .  Review of Systems  Positive: As above Negative: fever  Physical Exam  BP (!) 211/114 (BP Location: Right Arm)   Pulse 77   Temp 97.7 F (36.5 C) (Oral)   Resp 20   Ht 5\' 8"  (1.727 m)   Wt 77.1 kg   SpO2 100%   BMI 25.85 kg/m  Gen:   Awake, no distress   Resp:  Normal effort  MSK:   Left foot and ankle with swelling and deformity Other:    Medical Decision Making  Medically screening exam initiated at 10:29 AM.  Appropriate orders placed.  Kishon Garriga was informed that the remainder of the evaluation will be completed by another provider, this initial triage assessment does not replace that evaluation, and the importance of remaining in the ED until their evaluation is complete.  Labs ordered   Versie Starks, PA-C 02/14/22 1043

## 2022-02-14 NOTE — Assessment & Plan Note (Signed)
-   Secondary to hyperglycemia - Corrected serum sodium is 138, ([place], [place])

## 2022-02-14 NOTE — Progress Notes (Signed)
Pharmacy Antibiotic Note  Jason Francis is a 31 y.o. male admitted on 02/14/2022 with chronic  osteomyelitis .  PMH includes T1DM. Previous surgery on left foot a few years ago with possible hardware and/or osteomyelitis and reportedly recurring over past few months. Pharmacy has been consulted for vancomycin and cefepime dosing  Hypertensive in the ED. Scr elevated though baseline is unknown.   Plan:  Vancomycin 1500 mg loading dose Plan to dose vancomycin per levels due to unstable renal function 24 hour random vanc level  Cefepime 2 grams every 24 hours  Follow up Scr in the AM.   Height: 5\' 8"  (172.7 cm) Weight: 77 kg (169 lb 12.1 oz) IBW/kg (Calculated) : 68.4  Temp (24hrs), Avg:97.7 F (36.5 C), Min:97.7 F (36.5 C), Max:97.7 F (36.5 C)  Recent Labs  Lab 02/14/22 1102  WBC 9.7  CREATININE 3.74*  LATICACIDVEN 2.1*    Estimated Creatinine Clearance: 27.4 mL/min (A) (by C-G formula based on SCr of 3.74 mg/dL (H)).    Allergies  Allergen Reactions   Augmentin [Amoxicillin-Pot Clavulanate] Nausea And Vomiting   Sulfa Antibiotics Nausea And Vomiting    Antimicrobials this admission: Vancomycin 6/18 >>  Piperacillin/tazobactam 6/18 >> 6/18 Cefepime 6/18 >>   Dose adjustments this admission: N/a  Microbiology results: 6/18 MRSA PCR: ordered  Thank you for allowing pharmacy to be a part of this patient's care.  Wynelle Cleveland, PharmD Pharmacy Resident  02/14/2022 2:06 PM

## 2022-02-14 NOTE — ED Provider Notes (Signed)
[facility] Provider Note    Event Date/Time   First MD Initiated Contact with Patient 02/14/22 1149     (approximate)   History   Chest Pain and Vomiting   HPI  Jason Francis is a 31 y.o. male who presents to the ED for evaluation of Chest Pain and Vomiting   Patient self-reports a history of type 1 diabetes on sliding scale insulin only.  No long-acting insulin or pumps. 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  Patient resents to the ED for evaluation of recurrent emesis, diffuse myalgias and malaise, chest discomfort, left ankle swelling and discomfort.   He is a fairly poor historian and difficult to get a cogent history from as he is unwilling to participate. He reports primarily developing nausea and emesis overnight, about 12 hours ago.  Developing diffuse myalgias and discomfort after this.  Reports he threw up his methadone this morning and is concerned that he is withdrawing.  Regarding his left foot and ankle, he reports he had a surgery on it a few years ago when it was swollen like this.  Reports infection around hardware and/or bone infection.  Reports after a surgery the swelling went down, but it is recurred in the past few months.  He is not seen anyone in a few months .  No discrete falls or trauma.   When getting some additional history, sounds like he was admitted to [facility] hospital last year, maybe 8 months ago and was recommended to have a amputation of that left foot and ankle but he refused and left AMA.  Reportedly been increasingly swelling and getting worse since then.  Physical Exam   Triage Vital Signs: ED Triage Vitals  Enc Vitals Group     BP 02/14/22 1027 (!) 211/114     Pulse Rate 02/14/22 1027 77     Resp 02/14/22 1027 20     Temp 02/14/22 1027 97.7 F (36.5 C)     Temp Source 02/14/22 1027 Oral     SpO2 02/14/22 1027 100 %     Weight 02/14/22 1027 170 lb (77.1 kg)     Height 02/14/22 1027 5\' 8"   (1.727 m)     Head Circumference --      Peak Flow --      Pain Score 02/14/22 1031 8     Pain Loc --      Pain Edu? --      Excl. in [place]? --     Most recent vital signs: Vitals:   02/14/22 1031 02/14/22 1458  BP: (!) 212/114 (!) 197/102  Pulse:  90  Resp:  16  Temp:    SpO2:  99%    General: Awake.  Appears uncomfortable, hunched over an empty emesis bag without any heaving or emesis during my evaluation. CV:  Good peripheral perfusion.RRR  Resp:  Normal effort. CTAB Abd:  No distention.  Mild tenderness throughout without localizing features. MSK:  Close deformity to the left ankle that is slightly warm to the touch and is tender to palpation.  Pain with ranging.  No overlying skin changes beyond chronically dry and scaling skin. Neuro:  No focal deficits appreciated. Other:     ED Results / Procedures / Treatments   Labs (all labs ordered are listed, but only abnormal results are displayed) Labs Reviewed  CBC WITH DIFFERENTIAL/PLATELET - Abnormal; Notable for the following components:      Result Value   RBC 3.74 (*)  Hemoglobin 9.7 (*)    HCT 31.7 (*)    MCH 25.9 (*)    Neutro Abs 8.1 (*)    All other components within normal limits  COMPREHENSIVE METABOLIC PANEL - Abnormal; Notable for the following components:   Sodium 131 (*)    Potassium 5.4 (*)    CO2 19 (*)    Glucose, Bld 375 (*)    BUN 40 (*)    Creatinine, Ser 3.74 (*)    Total Protein 9.9 (*)    Albumin 3.2 (*)    Alkaline Phosphatase 132 (*)    GFR, Estimated 21 (*)    All other components within normal limits  LACTIC ACID, PLASMA - Abnormal; Notable for the following components:   Lactic Acid, Venous 2.1 (*)    All other components within normal limits  URINALYSIS, ROUTINE W REFLEX MICROSCOPIC - Abnormal; Notable for the following components:   Color, Urine STRAW (*)    APPearance CLEAR (*)    Glucose, UA >=500 (*)    Hgb urine dipstick MODERATE (*)    Protein, ur 100 (*)    Leukocytes,Ua  SMALL (*)    Bacteria, UA FEW (*)    All other components within normal limits  URINE DRUG SCREEN, QUALITATIVE ([facility] ONLY) - Abnormal; Notable for the following components:   Cocaine Metabolite,Ur [place] POSITIVE (*)    Cannabinoid 50 Ng, Ur [place] POSITIVE (*)    Methadone Scn, Ur POSITIVE (*)    All other components within normal limits  URINALYSIS, COMPLETE (UACMP) WITH MICROSCOPIC - Abnormal; Notable for the following components:   Color, Urine STRAW (*)    APPearance CLEAR (*)    Glucose, UA >=500 (*)    Hgb urine dipstick MODERATE (*)    Protein, ur 100 (*)    Leukocytes,Ua TRACE (*)    Bacteria, UA RARE (*)    All other components within normal limits  CBG MONITORING, ED - Abnormal; Notable for the following components:   Glucose-Capillary 343 (*)    All other components within normal limits  MRSA NEXT GEN BY PCR, NASAL  CULTURE, BLOOD (ROUTINE X 2)  CULTURE, BLOOD (ROUTINE X 2)  LIPASE, BLOOD  PROCALCITONIN  LACTIC ACID, PLASMA  C-REACTIVE PROTEIN  HIV ANTIBODY (ROUTINE TESTING W REFLEX)  SEDIMENTATION RATE  TROPONIN I (HIGH SENSITIVITY)  TROPONIN I (HIGH SENSITIVITY)    EKG Sinus rhythm with sinus arrhythmia, rate of 69 bpm.  Normal axis and intervals.  No evidence of acute ischemia.  RADIOLOGY CXR interpreted by me without evidence of acute cardiopulmonary pathology. Plain film of the left foot interpreted by me with significant bony erosion. Plain film of the left ankle interpreted by me with bony erosion and osteo  Official radiology report(s): DG Ankle Complete Left  Result Date: 02/14/2022 CLINICAL DATA:  Diabetic with swelling EXAM: LEFT ANKLE COMPLETE - 3+ VIEW; LEFT FOOT - COMPLETE 3+ VIEW COMPARISON:  None Available. FINDINGS: There is extensive marked bone destruction of the distal tibia, fibula and talus with complete obliteration of the ankle joint. Similar findings of bone destruction and distortion throughout the midfoot involving the anterior half of the  calcaneus to the proximal metatarsals. Extensive associated callus formation/periosteal reaction about the ankle. Severe soft tissue swelling with deformity of the ankle and foot. Arterial vascular calcifications noted. 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 Electronically Signed   By: Ofilia Neas M.D.   On: 02/14/2022 13:13   DG Foot  Complete Left  Result Date: 02/14/2022 CLINICAL DATA:  Diabetic with swelling EXAM: LEFT ANKLE COMPLETE - 3+ VIEW; LEFT FOOT - COMPLETE 3+ VIEW COMPARISON:  None Available. FINDINGS: There is extensive marked bone destruction of the distal tibia, fibula and talus with complete obliteration of the ankle joint. Similar findings of bone destruction and distortion throughout the midfoot involving the anterior half of the calcaneus to the proximal metatarsals. Extensive associated callus formation/periosteal reaction about the ankle. Severe soft tissue swelling with deformity of the ankle and foot. Arterial vascular calcifications noted. 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 Electronically Signed   By: Ofilia Neas M.D.   On: 02/14/2022 13:13   DG Chest Portable 1 View  Result Date: 02/14/2022 CLINICAL DATA:  31 year old male with history of chest pain and shortness of breath. EXAM: PORTABLE CHEST 1 VIEW COMPARISON:  No priors. FINDINGS: Lung volumes are low. Diffuse peribronchial cuffing. No consolidative airspace disease. No pleural effusions. No pneumothorax. No pulmonary nodule or mass noted. Pulmonary vasculature and the cardiomediastinal silhouette are within normal limits. IMPRESSION: 1. Diffuse peribronchial cuffing which may suggest an acute bronchitis. Electronically Signed   By: Vinnie Langton M.D.   On: 02/14/2022 13:00    PROCEDURES and INTERVENTIONS:  .1-3 Lead EKG Interpretation  Performed by: Vladimir Crofts, MD Authorized by: Vladimir Crofts, MD     Interpretation: normal     ECG rate:  74   ECG rate assessment: normal     Rhythm: sinus rhythm     Ectopy: none     Conduction: normal   .Critical Care  Performed by: Vladimir Crofts, MD Authorized by: Vladimir Crofts, MD   Critical care provider statement:    Critical care time (minutes):  30   Critical care time was exclusive of:  Separately billable procedures and treating other patients   Critical care was necessary to treat or prevent imminent or life-threatening deterioration of the following conditions:  Metabolic crisis and endocrine crisis   Critical care was time spent personally by me on the following activities:  Development of treatment plan with patient or surrogate, discussions with consultants, evaluation of patient's response to treatment, examination of patient, ordering and review of laboratory studies, ordering and review of radiographic studies, ordering and performing treatments and interventions, pulse oximetry, re-evaluation of patient's condition and review of old charts   Medications  methadone (DOLOPHINE) tablet 90 mg (90 mg Oral Not Given 02/14/22 1507)  vancomycin (VANCOREADY) IVPB 1500 mg/300 mL (1,500 mg Intravenous New Bag/Given 02/14/22 1420)  acetaminophen (TYLENOL) tablet 650 mg (has no administration in time range)    Or  acetaminophen (TYLENOL) suppository 650 mg (has no administration in time range)  ondansetron (ZOFRAN) tablet 4 mg (has no administration in time range)    Or  ondansetron (ZOFRAN) injection 4 mg (has no administration in time range)  heparin injection 5,000 Units (5,000 Units Subcutaneous Not Given 02/14/22 1421)  hydrALAZINE (APRESOLINE) tablet 25 mg (has no administration in time range)  metoprolol tartrate (LOPRESSOR) injection 5 mg (has no administration in time range)  insulin aspart (novoLOG) injection 0-9 Units (has no administration in time range)  insulin aspart (novoLOG) injection 0-5 Units (has no administration in  time range)  ceFEPIme (MAXIPIME) 2 g in sodium chloride 0.9 % 100 mL IVPB (has no administration in time range)  nicotine (NICODERM CQ - dosed in mg/24 hours) patch 21 mg (has no administration in time range)  metoCLOPramide (REGLAN) injection  10 mg (has no administration in time range)  ondansetron (ZOFRAN-ODT) disintegrating tablet 4 mg (4 mg Oral Given 02/14/22 1034)  sodium chloride 0.9 % bolus 1,000 mL (0 mLs Intravenous Stopped 02/14/22 1211)  lactated ringers bolus 1,000 mL (0 mLs Intravenous Stopped 02/14/22 1400)  metoCLOPramide (REGLAN) injection 10 mg (10 mg Intravenous Given 02/14/22 1235)  promethazine (PHENERGAN) injection 25 mg (25 mg Intramuscular Given 02/14/22 1445)  methadone (DOLOPHINE) tablet 40 mg (40 mg Oral Given 02/14/22 1507)     IMPRESSION / MDM / ASSESSMENT AND PLAN / ED COURSE  I reviewed the triage vital signs and the nursing notes.  Differential diagnosis includes, but is not limited to, DKA, HHS, ACS, osteomyelitis, fracture  {Patient presents with symptoms of an acute illness or injury that is potentially life-threatening.  31 year old type I diabetic on methadone presents with recurrent emesis and chronic left ankle swelling.  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  He is hyperglycemic but no signs of DKA.  No signs of ACS or significant acute cardiopulmonary pathology.  We will start IV antibiotics for his osteo, replace his chronic methadone.  Metabolic panel with poor renal function, likely AKI but no comparison.  No leukocytosis.  Procalcitonin is only mildly elevated.  Troponin negative.  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   Vladimir Crofts, MD 02/14/22 [id number]

## 2022-02-15 DIAGNOSIS — N179 Acute kidney failure, unspecified: Secondary | ICD-10-CM | POA: Diagnosis not present

## 2022-02-15 DIAGNOSIS — Z720 Tobacco use: Secondary | ICD-10-CM

## 2022-02-15 DIAGNOSIS — R1114 Bilious vomiting: Secondary | ICD-10-CM | POA: Diagnosis not present

## 2022-02-15 DIAGNOSIS — J209 Acute bronchitis, unspecified: Secondary | ICD-10-CM

## 2022-02-15 DIAGNOSIS — F1490 Cocaine use, unspecified, uncomplicated: Secondary | ICD-10-CM

## 2022-02-15 DIAGNOSIS — E871 Hypo-osmolality and hyponatremia: Secondary | ICD-10-CM

## 2022-02-15 DIAGNOSIS — E875 Hyperkalemia: Secondary | ICD-10-CM

## 2022-02-15 DIAGNOSIS — M868X7 Other osteomyelitis, ankle and foot: Secondary | ICD-10-CM | POA: Diagnosis not present

## 2022-02-15 DIAGNOSIS — M86372 Chronic multifocal osteomyelitis, left ankle and foot: Secondary | ICD-10-CM | POA: Diagnosis not present

## 2022-02-15 DIAGNOSIS — R112 Nausea with vomiting, unspecified: Secondary | ICD-10-CM

## 2022-02-15 DIAGNOSIS — F1120 Opioid dependence, uncomplicated: Secondary | ICD-10-CM

## 2022-02-15 DIAGNOSIS — R739 Hyperglycemia, unspecified: Secondary | ICD-10-CM

## 2022-02-15 LAB — CBC
HCT: 30.4 % — ABNORMAL LOW (ref 39.0–52.0)
Hemoglobin: 9.3 g/dL — ABNORMAL LOW (ref 13.0–17.0)
MCH: 25.7 pg — ABNORMAL LOW (ref 26.0–34.0)
MCHC: 30.6 g/dL (ref 30.0–36.0)
MCV: 84 fL (ref 80.0–100.0)
Platelets: 370 10*3/uL (ref 150–400)
RBC: 3.62 MIL/uL — ABNORMAL LOW (ref 4.22–5.81)
RDW: 15 % (ref 11.5–15.5)
WBC: 12.7 10*3/uL — ABNORMAL HIGH (ref 4.0–10.5)
nRBC: 0 % (ref 0.0–0.2)

## 2022-02-15 LAB — RESPIRATORY PANEL BY PCR

## 2022-02-15 LAB — CBG MONITORING, ED
Glucose-Capillary: 398 mg/dL — ABNORMAL HIGH (ref 70–99)
Glucose-Capillary: 387 mg/dL — ABNORMAL HIGH (ref 70–99)
Glucose-Capillary: 404 mg/dL — ABNORMAL HIGH (ref 70–99)
Glucose-Capillary: 325 mg/dL — ABNORMAL HIGH (ref 70–99)
Glucose-Capillary: 207 mg/dL — ABNORMAL HIGH (ref 70–99)
Glucose-Capillary: 87 mg/dL (ref 70–99)
Glucose-Capillary: 233 mg/dL — ABNORMAL HIGH (ref 70–99)
Glucose-Capillary: 212 mg/dL — ABNORMAL HIGH (ref 70–99)

## 2022-02-15 LAB — BASIC METABOLIC PANEL
Anion gap: 12 (ref 5–15)
BUN: 42 mg/dL — ABNORMAL HIGH (ref 6–20)
CO2: 16 mmol/L — ABNORMAL LOW (ref 22–32)
Calcium: 8.7 mg/dL — ABNORMAL LOW (ref 8.9–10.3)
Chloride: 108 mmol/L (ref 98–111)
Creatinine, Ser: 3.69 mg/dL — ABNORMAL HIGH (ref 0.61–1.24)
GFR, Estimated: 21 mL/min — ABNORMAL LOW (ref >60)
Glucose, Bld: 425 mg/dL — ABNORMAL HIGH (ref 70–99)
Potassium: 5 mmol/L (ref 3.5–5.1)
Sodium: 136 mmol/L (ref 135–145)

## 2022-02-15 LAB — LACTIC ACID, PLASMA: Lactic Acid, Venous: 1 mmol/L (ref 0.5–1.9)

## 2022-02-15 LAB — HIV ANTIBODY (ROUTINE TESTING W REFLEX): HIV Screen 4th Generation wRfx: NONREACTIVE

## 2022-02-15 LAB — PROCALCITONIN: Procalcitonin: 0.27 ng/mL

## 2022-02-15 LAB — HEMOGLOBIN A1C
Hgb A1c MFr Bld: 6.4 % — ABNORMAL HIGH (ref 4.8–5.6)
Mean Plasma Glucose: 136.98 mg/dL

## 2022-02-15 LAB — GLUCOSE, CAPILLARY: Glucose-Capillary: 158 mg/dL — ABNORMAL HIGH (ref 70–99)

## 2022-02-15 LAB — VANCOMYCIN, RANDOM: Vancomycin Rm: 18 ug/mL

## 2022-02-15 MED ORDER — INSULIN DETEMIR 100 UNIT/ML ~~LOC~~ SOLN
10.0000 [IU] | Freq: Two times a day (BID) | SUBCUTANEOUS | Status: DC
  Administered 2022-02-15 – 2022-02-16 (×3): 10 [IU] via SUBCUTANEOUS
  Filled 2022-02-15 (×3): qty 0.1

## 2022-02-15 MED ORDER — INSULIN ASPART 100 UNIT/ML IJ SOLN
3.0000 [IU] | Freq: Three times a day (TID) | INTRAMUSCULAR | Status: DC
Start: 2022-02-15 — End: 2022-02-17
  Administered 2022-02-15 – 2022-02-17 (×5): 3 [IU] via SUBCUTANEOUS
  Filled 2022-02-15 (×6): qty 1

## 2022-02-15 MED ORDER — TRAMADOL HCL 50 MG PO TABS
100.0000 mg | ORAL_TABLET | Freq: Four times a day (QID) | ORAL | Status: DC | PRN
  Administered 2022-02-15 – 2022-02-16 (×2): 100 mg via ORAL
  Filled 2022-02-15 (×2): qty 2

## 2022-02-15 MED ORDER — METHADONE HCL 10 MG PO TABS
40.0000 mg | ORAL_TABLET | Freq: Every day | ORAL | Status: DC
Start: 2022-02-15 — End: 2022-02-15

## 2022-02-15 MED ORDER — MORPHINE SULFATE (PF) 4 MG/ML IV SOLN
4.0000 mg | INTRAVENOUS | Status: DC | PRN
  Administered 2022-02-15: 4 mg via INTRAVENOUS
  Filled 2022-02-15: qty 1

## 2022-02-15 MED ORDER — PANTOPRAZOLE SODIUM 40 MG IV SOLR
40.0000 mg | Freq: Two times a day (BID) | INTRAVENOUS | Status: DC
Start: 2022-02-15 — End: 2022-02-17
  Administered 2022-02-15 – 2022-02-17 (×5): 40 mg via INTRAVENOUS
  Filled 2022-02-15 (×5): qty 10

## 2022-02-15 MED ORDER — HYDRALAZINE HCL 20 MG/ML IJ SOLN
5.0000 mg | Freq: Once | INTRAMUSCULAR | Status: AC
  Administered 2022-02-15: 5 mg via INTRAVENOUS
  Filled 2022-02-15: qty 1

## 2022-02-15 MED ORDER — AMLODIPINE BESYLATE 5 MG PO TABS
5.0000 mg | ORAL_TABLET | Freq: Every day | ORAL | Status: DC
  Administered 2022-02-15 – 2022-02-17 (×3): 5 mg via ORAL
  Filled 2022-02-15 (×3): qty 1

## 2022-02-15 MED ORDER — METHADONE HCL 10 MG PO TABS
20.0000 mg | ORAL_TABLET | Freq: Two times a day (BID) | ORAL | Status: DC
  Administered 2022-02-15 – 2022-02-16 (×3): 20 mg via ORAL
  Filled 2022-02-15 (×3): qty 2

## 2022-02-15 NOTE — ED Notes (Signed)
Neomia Glass NP notified of pt situation and pt giving himself 11 units of his own insulin

## 2022-02-15 NOTE — ED Notes (Signed)
This RN entered room to give pt something for nausea. Pt states he gave himself another 10 units of his Novolin insulin. Explained to pt again that it should go through the provider and he needs to be careful about giving himself too much insulin and that it could be dangerous. Pt states, "I dont care, I cant wait for a doctor, if I feel bad and need insulin Im gonna give it to myself". Neomia Glass NP informed that pt has taken 10 units of his own insulin.

## 2022-02-15 NOTE — ED Notes (Addendum)
Neomia Glass NP notified of pts increased blood sugar of 404 after insulin

## 2022-02-15 NOTE — ED Notes (Signed)
Pt on call light yelling that he needs his sugar checked right now. Informed pt that he is next to be taken at 0800 but we could take it earlier if he liked. Pt continues to state that he needs it taken right now or else. BS is taken and reading 398. Pt demands his family at bedside to draw up and give him his insulin immediately. Informed pt that I will inform provider of blood sugar and see what they would like to order for proper protocol and charting purposes.  Pt still refuses. Teaching is educated again. Pt still refuses and draws up own insulin and injects 11 units novolog from personal supply.

## 2022-02-15 NOTE — Progress Notes (Signed)
Responded to patients bedside after called from charge nurse. Per staff RN, patient had taken another dose of his own insulin. I spoke at length with the patient and patients visitor at bedside about the policy for home medications and the danger of the patient medicating himself while we are also medicating him. Patient stated he was nauseous and in pain from throwing up and he doesn't feel that we are managing his blood sugar appropriately which is why he is dosing himself. Patient agreed that if we could get him something for pain and nausea, that he would give up his own insulin to be stored in pharmacy while he is here. Primary RN notified of need for nausea medicine and pain medicine as well as plan to store home medication. Patient also worried that his blood sugar will not be check if asked. I ensured the patient that if he is feeling unwell and requests his sugar to be checked that we will check it within reason. Patient agreeable.

## 2022-02-15 NOTE — ED Notes (Signed)
Pt states he has access to his own medications and will not give them up. He also stated he will not tell me when he takes medications , pt is very unpleasant and un cooperative.

## 2022-02-15 NOTE — Plan of Care (Signed)
Patient arrived to unit with nurse Barbie Haggis at bedside. Nurse reported that patient self dosed with insulin again prior to being transferred to floor and stated that wife gave patient insulin back to patient. Nurse Jenny Reichmann also stated that risk management stated that we could not physically take his medications. Charge nurse was also notified and charge nurse Helene Kelp notified [facility]. Patient also arrived with respiratory panel still pending. So will continue to maintain droplet precautions at this time. General assessment due to patient's irritability.

## 2022-02-15 NOTE — ED Notes (Signed)
Pt c/o of pain rated 10/10 all over

## 2022-02-15 NOTE — Progress Notes (Signed)
       CROSS COVER NOTE  NAME: Montrae Braithwaite MRN: [id number] DOB : [birth year]  Secure chat received from nursing reporting elevated blood sugar. CBG 404 after patient reportedly gave himself 11U of novolog during the 0400 hour. He also reports he gave himself an additional 10U of novolog since the 404 CBG reading at 0550.  For patient safety he is strongly encouraged to utilize hospital supplied insulin, I will not order additional insulin at this time as we do not know what the patient is injecting himself with or how much.   Neomia Glass DNP, MHA, FNP-BC Nurse Practitioner [facility] Pager [phone]

## 2022-02-15 NOTE — Inpatient Diabetes Management (Addendum)
Inpatient Diabetes Program Recommendations  AACE/ADA: New Consensus Statement on Inpatient Glycemic Control   Target Ranges:  Prepandial:   less than 140 mg/dL      Peak postprandial:   less than 180 mg/dL (1-2 hours)      Critically ill patients:  140 - 180 mg/dL    Latest Reference Range & Units 02/15/22 04:29  Glucose 70 - 99 mg/dL 425 (H)    Latest Reference Range & Units 02/14/22 10:31 02/14/22 16:31 02/14/22 21:34 02/15/22 04:02 02/15/22 05:50 02/15/22 05:52  Glucose-Capillary 70 - 99 mg/dL 343 (H) 303 (H) 217 (H) 398 (H) 404 (H) 387 (H)    Latest Reference Range & Units 02/14/22 11:02  CO2 22 - 32 mmol/L 19 (L)  Glucose 70 - 99 mg/dL 375 (H)  Anion gap 5 - 15  12   Review of Glycemic Control  Diabetes history: DM1 (makes NO insulin) Outpatient Diabetes medications: Novolin R 4-5 units TID with meals Current orders for Inpatient glycemic control: Levemir 10 units BID, Novolog 0-9 units TID with meals, Novolog 0-5 units QHS  Inpatient Diabetes Program Recommendations:    Insulin: Noted Levemir 10 units BID ordered this morning.   HbgA1C: Current A1C in process.  Outpatient DM medications: At time of discharge, please prescribe basal insulin. If patient is able to get medications from [facility], they have [place] (they also have Humalog for short acting insulin).  NOTE: Per chart review, patient has DM1 and is only taking Novolin Regular for DM control. Patient admitted with chest pain, N/V, AKI, hyperglycemia, and osteomyelitis of left ankle.  Patient noted to be positive for cocaine, marijuana, and methadone. Lab glucose 425 mg/dl this morning and noted Levemir 10 units BID was ordered at 7:36 am to start today at 10:00. No prior notes in Epic or Care Everywhere. Will plan to talk with patient today.  Addendum 02/15/22@12 :15-Spoke with patient and his mother at bedside. Patient asleep but open eyes and answered questions appropriately. Patient states that  he has only been using Regular insulin 8-10 units with meals and will take more if needed based on glucose. Patient states that he has DM1 and that he use to take long acting insulin in addition to short acting insulin and DM was better controlled. He is unemployed and his mother has been buying Novolin R insulin over the counter at [facility] for him. She notes that she also takes insulin and uses Novolin NPH and Regular. Inquired about patient using any NPH and he states that he has used it before but it didn't work for him. In[facility] about [facility] and patient's mother reports that insurance was obtained through Methadone clinic so he could get Methadone. She reports that it does not cover any health care or any other prescriptions. Discussed that since he has DM1 and does not make any insulin, his DM would be better controlled with a long acting basal insulin, along with meal coverage insulin and correction insulin. Patient states that he would be willing to taking insulin the way he needs to if he could get the insulin. Patient does not have a PCP and has not seen a provider in years. Patient checks his glucose at home 2-3 times a day and his mother reports that she just recently bought a new Airline pilot at [employer] for him along with testing supplies.  Discussed importance of checking CBGs and maintaining good CBG control to prevent long-term and short-term  complications. Explained how hyperglycemia leads to damage within blood vessels which lead to the common complications seen with uncontrolled diabetes. Stressed to the patient the importance of improving glycemic control to prevent further complications from uncontrolled diabetes.  Informed patient that I would place consult for TOC to see if patient qualifies for medication assistance and if he can get established with local clinic.  Discussed current insulin orders and asked patient to only take insulin  as prescribed currently and it would be given by nursing staff.  Patient verbalized understanding of information discussed and reports no further questions at this time related to diabetes.  Thanks, Barnie Alderman, RN, MSN, [place] Diabetes Coordinator Inpatient Diabetes Program [phone] (Team Pager from 8am to [place])

## 2022-02-15 NOTE — ED Notes (Signed)
Patient gave his insulin to wife ed [place] and ed rn charge heather witnessed the medication transfer , pt stated he will only take meds thru nurses

## 2022-02-15 NOTE — Plan of Care (Signed)
Notified Physician Jason Francis Patient arrived to unit with nurse Barbie Haggis at bedside. Nurse reported that patient self dosed with insulin again prior to being transferred to floor and stated that wife gave patient insulin back to patient. Patient stated to [facility]) that he will give his insulin to nursing staff if he is able to get pain medication (currently giving him one of the 2 doses of morphine) and also he requested something for nausea, also advised AC that patient was not currently due for any more nausea medicine she then suggested to potentially reach out to you for Phenegran.

## 2022-02-15 NOTE — Progress Notes (Addendum)
[facility] PROGRESS NOTE    Progress Note  Jason Francis  SWF:[id number] DOB: [birth year] DOA: 02/14/2022 PCP: Pcp, No     Brief Narrative:   Jason Francis is an 31 y.o. male past medical history of insulin-dependent diabetes mellitus type 1 comes into the ED for nausea and vomiting, was found to febrile heart rate of 77 with a blood pressure of 210/114 satting 100% on room air, potassium 5.4 bicarb of 19 BUN of 40 creatinine of 3.7, white blood cell count 9.7, hemoglobin 9.8.  Chest x-ray showed peribronchial infiltrates  Assessment/Plan:   Intractable nausea & vomiting Started on Zofran and Reglan. Try to minimize narcotics, continue IV fluid hydration. Concern about diabetic gastroparesis.  Also in the differential include cannabis hyperemesis syndrome as his UDS is positive. His demeanor is very strange. After talking to him for a while he does remember that he takes methadone at home.  He has not taking it in over 24 hours. UDS was positive for cocaine methadone and cannabis.  Possible acute bronchitis: Mildly tachycardic and tachypneic, satting just barely above 90%. Minimal leukocytosis of 12, afebrile. Check respiratory panel. Blood cultures have been ordered she was started empirically on vancomycin and cefepime. Continue IV empiric antibiotics for an additional 24 hours.  Can transition to oral tomorrow. Relates no sick contacts.  Mild hyperkalemia: In the setting of acute kidney injury, improved with IV fluids. Recheck a basic metabolic panel tomorrow.  Acute kidney injury: No known baseline, she has no chart in care everywhere. He has been a diabetic for over 20 years, so there is probably a chronic component to this. She was started on IV fluids, her creatinine has basically remained unchanged.  Insulin-dependent diabetes mellitus type 1: A1c is pending, she has not had a good oral intake, she was started on sliding scale insulin. Blood glucose is running in  the 400s we will start long-acting insulin. This morning he relates he took 10 units of his INSULIN because his blood glucose was high. I explained the risk and benefits of doing this, I have explained he cannot take any of his home medications that we will provide him his medications for his safety.  I also told him that the hospital has protocols and rules in the ER for his own safety and that he should be willing to trust those protocols and rules for his management.  The nurse was present when we had this conversation.  Elevated blood pressure without a diagnosis of hypertension: Start him on Norvasc 10 IV as needed, try to keep blood pressure less than 160/90  Cocaine use: She endorses fever but not here in house. Blood cultures have been ordered, she was started empirically on Vanco and cefepime for possible acute bronchitis.  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 CRP 2.9.  [facility] has been consulted recommended to follow-up as an outpatient basis with his podiatrist or ankle and foot specialist with expertise in Charcot arthropathy.  He also relates he does not appear to have an active infection.  Severe protein caloric malnutrition: Noted.    DVT prophylaxis: lovenox Family Communication:mother  Status is: Observation The patient will require care spanning > 2 midnights and should be moved to inpatient because: Acute kidney injury uncontrolled hypertension, intractable nausea and vomiting and possible acute bronchitis    Code Status:     Code Status Orders  (From admission, onward)  Start     Ordered   02/14/22 1343  Full code  Continuous        02/14/22 1344           Code Status History     This patient has a current code status but no historical code status.         IV Access:   Peripheral IV   Procedures and diagnostic studies:   DG Ankle Complete  Left  Result Date: 02/14/2022 CLINICAL DATA:  Diabetic with swelling EXAM: LEFT ANKLE COMPLETE - 3+ VIEW; LEFT FOOT - COMPLETE 3+ VIEW COMPARISON:  None Available. FINDINGS: There is extensive marked bone destruction of the distal tibia, fibula and talus with complete obliteration of the ankle joint. Similar findings of bone destruction and distortion throughout the midfoot involving the anterior half of the calcaneus to the proximal metatarsals. Extensive associated callus formation/periosteal reaction about the ankle. Severe soft tissue swelling with deformity of the ankle and foot. Arterial vascular calcifications noted. 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 Electronically Signed   By: Ofilia Neas M.D.   On: 02/14/2022 13:13   DG Foot Complete Left  Result Date: 02/14/2022 CLINICAL DATA:  Diabetic with swelling EXAM: LEFT ANKLE COMPLETE - 3+ VIEW; LEFT FOOT - COMPLETE 3+ VIEW COMPARISON:  None Available. FINDINGS: There is extensive marked bone destruction of the distal tibia, fibula and talus with complete obliteration of the ankle joint. Similar findings of bone destruction and distortion throughout the midfoot involving the anterior half of the calcaneus to the proximal metatarsals. Extensive associated callus formation/periosteal reaction about the ankle. Severe soft tissue swelling with deformity of the ankle and foot. Arterial vascular calcifications noted. 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 Electronically Signed   By: Ofilia Neas M.D.   On: 02/14/2022 13:13   DG Chest Portable 1 View  Result Date: 02/14/2022 CLINICAL DATA:  31 year old male with history of chest pain and shortness of breath. EXAM: PORTABLE CHEST 1 VIEW COMPARISON:  No priors. FINDINGS: Lung volumes are low. Diffuse peribronchial cuffing. No consolidative airspace disease. No pleural effusions. No  pneumothorax. No pulmonary nodule or mass noted. Pulmonary vasculature and the cardiomediastinal silhouette are within normal limits. IMPRESSION: 1. Diffuse peribronchial cuffing which may suggest an acute bronchitis. Electronically Signed   By: Vinnie Langton M.D.   On: 02/14/2022 13:00     Medical Consultants:   None.   Subjective:    Jason Francis continues to complain of back pain and abdominal pain with nausea and vomiting  Objective:    Vitals:   02/14/22 2230 02/14/22 2300 02/15/22 0130 02/15/22 0600  BP: (!) 188/87 (!) 171/82 (!) 166/72 (!) 209/106  Pulse: 100 81 98 92  Resp: 16 15 13 10   Temp:      TempSrc:      SpO2: 98% 97% 99% 97%  Weight:      Height:       SpO2: 97 %  No intake or output data in the 24 hours ending 02/15/22 0729 Filed Weights   02/14/22 1027 02/14/22 1032  Weight: 77.1 kg 77 kg    Exam: General exam: In no acute distress. Respiratory system: Good air movement and clear to auscultation. Cardiovascular system: S1 & S2 heard, RRR. No JVD. Gastrointestinal system: Abdomen is nondistended, soft and nontender.  Extremities: Charcot's foot Skin: No rashes, lesions or ulcers Psychiatry: Judgement and insight appear normal. Mood &  affect appropriate.    Data Reviewed:    Labs: Basic Metabolic Panel: Recent Labs  Lab 02/14/22 1102 02/15/22 0429  NA 131* 136  K 5.4* 5.0  CL 100 108  CO2 19* 16*  GLUCOSE 375* 425*  BUN 40* 42*  CREATININE 3.74* 3.69*  CALCIUM 8.9 8.7*   GFR Estimated Creatinine Clearance: 27.8 mL/min (A) (by C-G formula based on SCr of 3.69 mg/dL (H)). Liver Function Tests: Recent Labs  Lab 02/14/22 1102  AST 32  ALT 24  ALKPHOS 132*  BILITOT 0.7  PROT 9.9*  ALBUMIN 3.2*   Recent Labs  Lab 02/14/22 1102  LIPASE 23   No results for input(s): "AMMONIA" in the last 168 hours. Coagulation profile No results for input(s): "INR", "PROTIME" in the last 168 hours. COVID-19 Labs  Recent Labs     02/14/22 1413  CRP 2.9*    No results found for: "SARSCOV2NAA"  CBC: Recent Labs  Lab 02/14/22 1102 02/15/22 0429  WBC 9.7 12.7*  NEUTROABS 8.1*  --   HGB 9.7* 9.3*  HCT 31.7* 30.4*  MCV 84.8 84.0  PLT 384 370   Cardiac Enzymes: No results for input(s): "CKTOTAL", "CKMB", "CKMBINDEX", "TROPONINI" in the last 168 hours. BNP (last 3 results) No results for input(s): "PROBNP" in the last 8760 hours. CBG: Recent Labs  Lab 02/14/22 1631 02/14/22 2134 02/15/22 0402 02/15/22 0550 02/15/22 0552  GLUCAP 303* 217* 398* 404* 387*   D-Dimer: No results for input(s): "DDIMER" in the last 72 hours. Hgb A1c: No results for input(s): "HGBA1C" in the last 72 hours. Lipid Profile: No results for input(s): "CHOL", "HDL", "LDLCALC", "TRIG", "CHOLHDL", "LDLDIRECT" in the last 72 hours. Thyroid function studies: No results for input(s): "TSH", "T4TOTAL", "T3FREE", "THYROIDAB" in the last 72 hours.  Invalid input(s): "FREET3" Anemia work up: No results for input(s): "VITAMINB12", "FOLATE", "FERRITIN", "TIBC", "IRON", "RETICCTPCT" in the last 72 hours. Sepsis Labs: Recent Labs  Lab 02/14/22 1102 02/14/22 1412 02/15/22 0429  PROCALCITON  --  0.25 0.27  WBC 9.7  --  12.7*  LATICACIDVEN 2.1*  --  1.0   Microbiology Recent Results (from the past 240 hour(s))  MRSA Next Gen by PCR, Nasal     Status: None   Collection Time: 02/14/22  2:12 PM   Specimen: Nasal Mucosa; Nasal Swab  Result Value Ref Range Status   MRSA by PCR Next Gen NOT DETECTED NOT DETECTED Final    Comment: (NOTE) The GeneXpert MRSA Assay (FDA approved for NASAL specimens only), is one component of a comprehensive MRSA colonization surveillance program. It is not intended to diagnose MRSA infection nor to guide or monitor treatment for MRSA infections. Test performance is not FDA approved in patients less than 73 years old. Performed at [facility], [address] [place], [place] [address]       Medications:    insulin aspart  0-5 Units Subcutaneous QHS   insulin aspart  0-9 Units Subcutaneous TID WC   Continuous Infusions:  sodium chloride 125 mL/hr at 02/14/22 1816   ceFEPime (MAXIPIME) IV Stopped (02/14/22 1659)      LOS: 0 days   Charlynne Cousins  [facility]  02/15/2022, 7:29 AM

## 2022-02-16 DIAGNOSIS — R0789 Other chest pain: Secondary | ICD-10-CM | POA: Diagnosis present

## 2022-02-16 DIAGNOSIS — Z882 Allergy status to sulfonamides status: Secondary | ICD-10-CM | POA: Diagnosis not present

## 2022-02-16 DIAGNOSIS — K3184 Gastroparesis: Secondary | ICD-10-CM | POA: Diagnosis present

## 2022-02-16 DIAGNOSIS — F1490 Cocaine use, unspecified, uncomplicated: Secondary | ICD-10-CM | POA: Diagnosis present

## 2022-02-16 DIAGNOSIS — Z881 Allergy status to other antibiotic agents status: Secondary | ICD-10-CM | POA: Diagnosis not present

## 2022-02-16 DIAGNOSIS — F1290 Cannabis use, unspecified, uncomplicated: Secondary | ICD-10-CM | POA: Diagnosis present

## 2022-02-16 DIAGNOSIS — M868X7 Other osteomyelitis, ankle and foot: Secondary | ICD-10-CM | POA: Diagnosis not present

## 2022-02-16 DIAGNOSIS — E1043 Type 1 diabetes mellitus with diabetic autonomic (poly)neuropathy: Secondary | ICD-10-CM | POA: Diagnosis present

## 2022-02-16 DIAGNOSIS — Z794 Long term (current) use of insulin: Secondary | ICD-10-CM | POA: Diagnosis not present

## 2022-02-16 DIAGNOSIS — E10610 Type 1 diabetes mellitus with diabetic neuropathic arthropathy: Secondary | ICD-10-CM | POA: Diagnosis present

## 2022-02-16 DIAGNOSIS — N184 Chronic kidney disease, stage 4 (severe): Secondary | ICD-10-CM | POA: Diagnosis present

## 2022-02-16 DIAGNOSIS — F17210 Nicotine dependence, cigarettes, uncomplicated: Secondary | ICD-10-CM | POA: Diagnosis present

## 2022-02-16 DIAGNOSIS — E1065 Type 1 diabetes mellitus with hyperglycemia: Secondary | ICD-10-CM | POA: Diagnosis present

## 2022-02-16 DIAGNOSIS — F1120 Opioid dependence, uncomplicated: Secondary | ICD-10-CM | POA: Diagnosis present

## 2022-02-16 DIAGNOSIS — R1114 Bilious vomiting: Secondary | ICD-10-CM | POA: Diagnosis not present

## 2022-02-16 DIAGNOSIS — E875 Hyperkalemia: Secondary | ICD-10-CM | POA: Diagnosis present

## 2022-02-16 DIAGNOSIS — G8929 Other chronic pain: Secondary | ICD-10-CM | POA: Diagnosis present

## 2022-02-16 DIAGNOSIS — E871 Hypo-osmolality and hyponatremia: Secondary | ICD-10-CM | POA: Diagnosis present

## 2022-02-16 DIAGNOSIS — N179 Acute kidney failure, unspecified: Secondary | ICD-10-CM | POA: Diagnosis present

## 2022-02-16 DIAGNOSIS — M86372 Chronic multifocal osteomyelitis, left ankle and foot: Secondary | ICD-10-CM | POA: Diagnosis not present

## 2022-02-16 DIAGNOSIS — I129 Hypertensive chronic kidney disease with stage 1 through stage 4 chronic kidney disease, or unspecified chronic kidney disease: Secondary | ICD-10-CM | POA: Diagnosis present

## 2022-02-16 DIAGNOSIS — E1022 Type 1 diabetes mellitus with diabetic chronic kidney disease: Secondary | ICD-10-CM | POA: Diagnosis present

## 2022-02-16 LAB — CBC
HCT: 27.7 % — ABNORMAL LOW (ref 39.0–52.0)
Hemoglobin: 8.6 g/dL — ABNORMAL LOW (ref 13.0–17.0)
MCH: 26.2 pg (ref 26.0–34.0)
MCHC: 31 g/dL (ref 30.0–36.0)
MCV: 84.5 fL (ref 80.0–100.0)
Platelets: 329 10*3/uL (ref 150–400)
RBC: 3.28 MIL/uL — ABNORMAL LOW (ref 4.22–5.81)
RDW: 15 % (ref 11.5–15.5)
WBC: 10.3 10*3/uL (ref 4.0–10.5)
nRBC: 0 % (ref 0.0–0.2)

## 2022-02-16 LAB — GLUCOSE, CAPILLARY
Glucose-Capillary: 258 mg/dL — ABNORMAL HIGH (ref 70–99)
Glucose-Capillary: 240 mg/dL — ABNORMAL HIGH (ref 70–99)
Glucose-Capillary: 239 mg/dL — ABNORMAL HIGH (ref 70–99)
Glucose-Capillary: 118 mg/dL — ABNORMAL HIGH (ref 70–99)
Glucose-Capillary: 114 mg/dL — ABNORMAL HIGH (ref 70–99)
Glucose-Capillary: 123 mg/dL — ABNORMAL HIGH (ref 70–99)
Glucose-Capillary: 92 mg/dL (ref 70–99)

## 2022-02-16 LAB — BASIC METABOLIC PANEL
Anion gap: 7 (ref 5–15)
BUN: 44 mg/dL — ABNORMAL HIGH (ref 6–20)
CO2: 18 mmol/L — ABNORMAL LOW (ref 22–32)
Calcium: 8.4 mg/dL — ABNORMAL LOW (ref 8.9–10.3)
Chloride: 110 mmol/L (ref 98–111)
Creatinine, Ser: 3.59 mg/dL — ABNORMAL HIGH (ref 0.61–1.24)
GFR, Estimated: 22 mL/min — ABNORMAL LOW (ref >60)
Glucose, Bld: 297 mg/dL — ABNORMAL HIGH (ref 70–99)
Potassium: 4.4 mmol/L (ref 3.5–5.1)
Sodium: 135 mmol/L (ref 135–145)

## 2022-02-16 LAB — PROCALCITONIN: Procalcitonin: 0.27 ng/mL

## 2022-02-16 MED ORDER — ADULT MULTIVITAMIN W/MINERALS CH
1.0000 | ORAL_TABLET | Freq: Every day | ORAL | Status: DC
  Administered 2022-02-16 – 2022-02-17 (×2): 1 via ORAL
  Filled 2022-02-16 (×2): qty 1

## 2022-02-16 MED ORDER — INSULIN ASPART 100 UNIT/ML IJ SOLN
0.0000 [IU] | INTRAMUSCULAR | Status: DC
  Administered 2022-02-16: 3 [IU] via SUBCUTANEOUS
  Administered 2022-02-16: 1 [IU] via SUBCUTANEOUS
  Administered 2022-02-16: 5 [IU] via SUBCUTANEOUS
  Administered 2022-02-17 (×2): 2 [IU] via SUBCUTANEOUS
  Filled 2022-02-16 (×6): qty 1

## 2022-02-16 MED ORDER — ENSURE MAX PROTEIN PO LIQD
11.0000 [oz_av] | Freq: Every day | ORAL | Status: DC
  Filled 2022-02-16: qty 330

## 2022-02-16 MED ORDER — HYDRALAZINE HCL 25 MG PO TABS
25.0000 mg | ORAL_TABLET | Freq: Three times a day (TID) | ORAL | Status: DC
  Administered 2022-02-16 – 2022-02-17 (×3): 25 mg via ORAL
  Filled 2022-02-16 (×3): qty 1

## 2022-02-16 MED ORDER — ONDANSETRON HCL 4 MG/2ML IJ SOLN: 4.0000 mg | Freq: Four times a day (QID) | INTRAMUSCULAR | Status: DC | PRN

## 2022-02-16 MED ORDER — VANCOMYCIN VARIABLE DOSE PER UNSTABLE RENAL FUNCTION (PHARMACIST DOSING): Status: DC

## 2022-02-16 MED ORDER — INSULIN DETEMIR 100 UNIT/ML ~~LOC~~ SOLN
20.0000 [IU] | Freq: Two times a day (BID) | SUBCUTANEOUS | Status: DC
  Administered 2022-02-16 – 2022-02-17 (×2): 20 [IU] via SUBCUTANEOUS
  Filled 2022-02-16 (×3): qty 0.2

## 2022-02-16 MED ORDER — DOXYCYCLINE HYCLATE 100 MG PO TABS
100.0000 mg | ORAL_TABLET | Freq: Two times a day (BID) | ORAL | Status: DC
  Administered 2022-02-16 – 2022-02-17 (×3): 100 mg via ORAL
  Filled 2022-02-16 (×3): qty 1

## 2022-02-16 MED ORDER — METHADONE HCL 10 MG PO TABS
40.0000 mg | ORAL_TABLET | Freq: Two times a day (BID) | ORAL | Status: DC
  Administered 2022-02-16 – 2022-02-17 (×2): 40 mg via ORAL
  Filled 2022-02-16 (×2): qty 4

## 2022-02-16 MED ORDER — ONDANSETRON HCL 4 MG/2ML IJ SOLN
4.0000 mg | Freq: Once | INTRAMUSCULAR | Status: DC
Start: 2022-02-16 — End: 2022-02-16

## 2022-02-16 MED ORDER — GLUCERNA SHAKE PO LIQD
237.0000 mL | Freq: Two times a day (BID) | ORAL | Status: DC
  Administered 2022-02-16: 237 mL via ORAL

## 2022-02-16 MED ORDER — METOCLOPRAMIDE HCL 5 MG PO TABS
5.0000 mg | ORAL_TABLET | Freq: Three times a day (TID) | ORAL | Status: DC
  Administered 2022-02-16 – 2022-02-17 (×5): 5 mg via ORAL
  Filled 2022-02-16 (×6): qty 1

## 2022-02-16 NOTE — TOC Initial Note (Signed)
Transition of Care [facility]) - Initial/Assessment Note    Patient Details  Name: Jason Francis MRN: [id number] Date of Birth: [birth year]  [facility] CM/SW Contact:    Pete Pelt, RN Phone Number: 02/16/2022, 3:57 PM  Clinical Narrative:      Patient lives at home with 3 family members.  He has no transportation concerns, able to get to appointments.    Patient and family verifies he has [employer] but would like [employer] discussed with patient and family to assess insurance plan and explore PCP that accept patient insurance.  Patient states that he does not know details about insurance.  Pateint's mother states she will call [employer] and assess coverage information.  RNCM offered to print a list of [place] PCP locally for patient to review but patient refused.    Patient asked for medication assistance from medication management.  RNCM explained that patient has insurance and offered Good RX and pharmacy consult to assist with medications.  Patient refused, stated he has good rx.  Pharmacist notified of patient situation for any other RX suggestions.  Patient denies having any medication concerns, states he goes to methadone clinic and takes medications as prescribed by MD.    Liana Gerold further assistance and patient and family stated they would call for PCP and would look into medications.             Expected Discharge Plan: Home/Self Care Barriers to Discharge: Continued Medical Work up   Patient Goals and CMS Choice     Choice offered to / list presented to : NA  Expected Discharge Plan and Services Expected Discharge Plan: Home/Self Care   Discharge Planning Services: CM Consult                                          Prior Living Arrangements/Services     Patient language and need for interpreter reviewed:: Yes Do you feel safe going back to the place where you live?: Yes      Need for Family Participation in Patient Care: Yes  (Comment) Care giver support system in place?: Yes (comment)   Criminal Activity/Legal Involvement Pertinent to Current Situation/Hospitalization: No - Comment as needed  Activities of Daily Living      Permission Sought/Granted Permission sought to share information with : Case Manager Permission granted to share information with : Yes, Verbal Permission Granted              Emotional Assessment Appearance:: Appears stated age   Affect (typically observed): Frustrated, Appropriate Orientation: : Oriented to Self, Oriented to Place, Oriented to  Time, Oriented to Situation Alcohol / Substance Use: Not Applicable Psych Involvement: No (comment)  Admission diagnosis:  Other chest pain [R07.89] 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] Hyperglycemia [R73.9] 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) [X52.841] Other osteomyelitis of left ankle ([place]) [M86.8X7] AKI (acute kidney injury) ([place]) [N17.9] Patient Active Problem List   Diagnosis Date Noted   Chronic multifocal osteomyelitis of left ankle ([place]) 02/14/2022   Insulin dependent type 1 diabetes mellitus ([place]) 02/14/2022   AKI (acute kidney injury) ([place]) 02/14/2022   Protein-calorie malnutrition, severe ([place]) 02/14/2022   Hyponatremia 02/14/2022   Nausea & vomiting 02/14/2022   Cocaine use 02/14/2022   Tobacco use 02/14/2022   Hyperkalemia 02/14/2022   PCP:  Pcp, No Pharmacy:   [facility] [phone] -  [address] (N), [place] [address] [place] [address] Phone: [phone] Fax: [phone]     Social Determinants of Health (SDOH) Interventions    Readmission Risk Interventions     No data to display

## 2022-02-16 NOTE — Plan of Care (Signed)
Problem: Education: Goal: Ability to describe self-care measures that may prevent or decrease complications (Diabetes Survival Skills Education) will improve 02/16/2022 0421 by Parke Poisson, RN Outcome: Progressing 02/16/2022 0419 by Parke Poisson, RN Outcome: Progressing Goal: Individualized Educational Video(s) 02/16/2022 0421 by Parke Poisson, RN Outcome: Progressing 02/16/2022 0419 by Parke Poisson, RN Outcome: Progressing   Problem: Coping: Goal: Ability to adjust to condition or change in health will improve 02/16/2022 0421 by Parke Poisson, RN Outcome: Progressing 02/16/2022 0419 by Parke Poisson, RN Outcome: Progressing   Problem: Fluid Volume: Goal: Ability to maintain a balanced intake and output will improve 02/16/2022 0421 by Parke Poisson, RN Outcome: Progressing 02/16/2022 0419 by Parke Poisson, RN Outcome: Progressing   Problem: Health Behavior/Discharge Planning: Goal: Ability to identify and utilize available resources and services will improve 02/16/2022 0421 by Parke Poisson, RN Outcome: Progressing 02/16/2022 0419 by Parke Poisson, RN Outcome: Progressing Goal: Ability to manage health-related needs will improve 02/16/2022 0421 by Parke Poisson, RN Outcome: Progressing 02/16/2022 0419 by Parke Poisson, RN Outcome: Progressing   Problem: Metabolic: Goal: Ability to maintain appropriate glucose levels will improve 02/16/2022 0421 by Parke Poisson, RN Outcome: Progressing 02/16/2022 0419 by Parke Poisson, RN Outcome: Progressing   Problem: Nutritional: Goal: Maintenance of adequate nutrition will improve 02/16/2022 0421 by Parke Poisson, RN Outcome: Progressing 02/16/2022 0419 by Parke Poisson, RN Outcome: Progressing Goal: Progress toward achieving an optimal weight will improve 02/16/2022 0421 by Parke Poisson, RN Outcome: Progressing 02/16/2022 0419 by Parke Poisson, RN Outcome: Progressing   Problem: Skin Integrity: Goal: Risk for  impaired skin integrity will decrease 02/16/2022 0421 by Parke Poisson, RN Outcome: Progressing 02/16/2022 0419 by Parke Poisson, RN Outcome: Progressing   Problem: Tissue Perfusion: Goal: Adequacy of tissue perfusion will improve 02/16/2022 0421 by Parke Poisson, RN Outcome: Progressing 02/16/2022 0419 by Parke Poisson, RN Outcome: Progressing   Problem: Education: Goal: Knowledge of General Education information will improve Description: Including pain rating scale, medication(s)/side effects and non-pharmacologic comfort measures 02/16/2022 0421 by Parke Poisson, RN Outcome: Progressing 02/16/2022 0419 by Parke Poisson, RN Outcome: Progressing   Problem: Health Behavior/Discharge Planning: Goal: Ability to manage health-related needs will improve 02/16/2022 0421 by Parke Poisson, RN Outcome: Progressing 02/16/2022 0419 by Parke Poisson, RN Outcome: Progressing   Problem: Clinical Measurements: Goal: Ability to maintain clinical measurements within normal limits will improve 02/16/2022 0421 by Parke Poisson, RN Outcome: Progressing 02/16/2022 0419 by Parke Poisson, RN Outcome: Progressing Goal: Will remain free from infection 02/16/2022 0421 by Parke Poisson, RN Outcome: Progressing 02/16/2022 0419 by Parke Poisson, RN Outcome: Progressing Goal: Diagnostic test results will improve 02/16/2022 0421 by Parke Poisson, RN Outcome: Progressing 02/16/2022 0419 by Parke Poisson, RN Outcome: Progressing Goal: Respiratory complications will improve 02/16/2022 0421 by Parke Poisson, RN Outcome: Progressing 02/16/2022 0419 by Parke Poisson, RN Outcome: Progressing Goal: Cardiovascular complication will be avoided 02/16/2022 0421 by Parke Poisson, RN Outcome: Progressing 02/16/2022 0419 by Parke Poisson, RN Outcome: Progressing   Problem: Activity: Goal: Risk for activity intolerance will decrease 02/16/2022 0421 by Parke Poisson, RN Outcome: Progressing 02/16/2022  0419 by Parke Poisson, RN Outcome: Progressing   Problem: Nutrition: Goal: Adequate nutrition will be maintained 02/16/2022 0421 by Parke Poisson, RN Outcome: Progressing 02/16/2022 0419 by Parke Poisson, RN Outcome: Progressing   Problem: Coping: Goal: Level of anxiety will decrease 02/16/2022 0421 by Parke Poisson, RN Outcome: Progressing 02/16/2022 0419 by Parke Poisson, RN Outcome: Progressing   Problem: Elimination: Goal: Will not experience complications related to bowel motility 02/16/2022 0421  by Parke Poisson, RN Outcome: Progressing 02/16/2022 0419 by Parke Poisson, RN Outcome: Progressing Goal: Will not experience complications related to urinary retention 02/16/2022 0421 by Parke Poisson, RN Outcome: Progressing 02/16/2022 0419 by Parke Poisson, RN Outcome: Progressing   Problem: Pain Managment: Goal: General experience of comfort will improve 02/16/2022 0421 by Parke Poisson, RN Outcome: Progressing 02/16/2022 0419 by Parke Poisson, RN Outcome: Progressing   Problem: Safety: Goal: Ability to remain free from injury will improve 02/16/2022 0421 by Parke Poisson, RN Outcome: Progressing 02/16/2022 0419 by Parke Poisson, RN Outcome: Progressing   Problem: Skin Integrity: Goal: Risk for impaired skin integrity will decrease 02/16/2022 0421 by Parke Poisson, RN Outcome: Progressing 02/16/2022 0419 by Parke Poisson, RN Outcome: Progressing

## 2022-02-16 NOTE — Progress Notes (Addendum)
[facility] PROGRESS NOTE    Progress Note  Jason Francis  TWS:[id number] DOB: 01/04/1991 DOA: 02/14/2022 PCP: Pcp, No     Brief Narrative:   Jason Francis is an 31 y.o. male past medical history of insulin-dependent diabetes mellitus type 1 comes into the ED for nausea and vomiting, was found to febrile heart rate of 77 with a blood pressure of 210/114 satting 100% on room air, potassium 5.4 bicarb of 19 BUN of 40 creatinine of 3.7, white blood cell count 9.7, hemoglobin 9.8.  Chest x-ray showed peribronchial infiltrates  Assessment/Plan:   Intractable nausea & vomiting Started on Zofran and Reglan. Start him on small frequent meals. Possibly diabetic gastroparesis (cannot perform gastric emptying study as he is on methadone and narcotics chronically which will give a false positive rate) and or possibly cannabis hyperemesis syndrome as his UDS is positive and possibly withdrawing from narcotics. He was not taking his methadone 24 hours prior to admission. UDS was positive for cocaine methadone and cannabis. Now resolved currently on Protonix and Reglan tolerating his diet.  Possible acute bronchitis: Tachycardia tachypnea and saturations have improved. Leukocytosis is resolved, afebrile procalcitonin was low yield. Respiratory panel is negative. Blood cultures have been negative till date. We will start empirically on IV vancomycin and Zosyn we will go ahead and transition to oral Doxy.  Mild hyperkalemia: Resolved with fluid resuscitation.  Acute kidney injury: No known baseline, she has no chart in care everywhere. He has been a diabetic for over 20 years, so there is probably a chronic component to this. Despite fluid resuscitation his creatinine has remained unchanged. 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 He was started on long-acting insulin plus  sliding scale his blood glucose was above 500. The patient took about 10 units from his home dose insulin the risk and benefits of doing that was discussed with him. He agreed to let us manage his diabetes. I explained the risk and benefits of doing this, I have explained he cannot take any of his home medications that we will provide him his medications for his safety.  I also told him that the hospital has protocols and rules in the ER for his own safety and that he should be willing to trust those protocols and rules for his management.  The nurse was present when we had this conversation.  Elevated blood pressure without a diagnosis of hypertension: We will start her on Norvasc her blood pressure continues to be significantly elevated, not a candidate for ACE inhibitor due to his chronic renal dysfunction. We will start on Imdur. Continue to monitor his blood pressure closely.  Cocaine use: She endorses fever but not here in house. Blood cultures have been negative x2. De-escalate treatment to Doxy.  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 CRP 2.9.  [facility] has been consulted recommended to follow-up as an outpatient basis with his podiatrist or ankle and foot specialist with expertise in Charcot arthropathy.  He also relates he does not appear to have an active infection.  Severe protein caloric malnutrition: Noted.    DVT prophylaxis: lovenox Family Communication:mother  Status is: Observation The patient will require care spanning > 2 midnights and should be moved to inpatient because: Acute kidney injury uncontrolled hypertension, intractable nausea and vomiting and possible acute bronchitis    Code Status:  Code Status Orders  (From admission, onward)           Start     Ordered   02/14/22 1343  Full code  Continuous        02/14/22 1344           Code Status History     This  patient has a current code status but no historical code status.         IV Access:   Peripheral IV   Procedures and diagnostic studies:   DG Ankle Complete Left  Result Date: 02/14/2022 CLINICAL DATA:  Diabetic with swelling EXAM: LEFT ANKLE COMPLETE - 3+ VIEW; LEFT FOOT - COMPLETE 3+ VIEW COMPARISON:  None Available. FINDINGS: There is extensive marked bone destruction of the distal tibia, fibula and talus with complete obliteration of the ankle joint. Similar findings of bone destruction and distortion throughout the midfoot involving the anterior half of the calcaneus to the proximal metatarsals. Extensive associated callus formation/periosteal reaction about the ankle. Severe soft tissue swelling with deformity of the ankle and foot. Arterial vascular calcifications noted. 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 Electronically Signed   By: Ofilia Neas M.D.   On: 02/14/2022 13:13   DG Foot Complete Left  Result Date: 02/14/2022 CLINICAL DATA:  Diabetic with swelling EXAM: LEFT ANKLE COMPLETE - 3+ VIEW; LEFT FOOT - COMPLETE 3+ VIEW COMPARISON:  None Available. FINDINGS: There is extensive marked bone destruction of the distal tibia, fibula and talus with complete obliteration of the ankle joint. Similar findings of bone destruction and distortion throughout the midfoot involving the anterior half of the calcaneus to the proximal metatarsals. Extensive associated callus formation/periosteal reaction about the ankle. Severe soft tissue swelling with deformity of the ankle and foot. Arterial vascular calcifications noted. 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 Electronically Signed   By: Ofilia Neas M.D.   On: 02/14/2022 13:13   DG Chest Portable 1 View  Result Date: 02/14/2022 CLINICAL DATA:  31 year old male with history of chest pain and shortness of breath. EXAM:  PORTABLE CHEST 1 VIEW COMPARISON:  No priors. FINDINGS: Lung volumes are low. Diffuse peribronchial cuffing. No consolidative airspace disease. No pleural effusions. No pneumothorax. No pulmonary nodule or mass noted. Pulmonary vasculature and the cardiomediastinal silhouette are within normal limits. IMPRESSION: 1. Diffuse peribronchial cuffing which may suggest an acute bronchitis. Electronically Signed   By: Vinnie Langton M.D.   On: 02/14/2022 13:00     Medical Consultants:   None.   Subjective:    [name] vomiting is significantly improved he relates he feels much better tolerating his diet.  Objective:    Vitals:   02/15/22 1954 02/15/22 2018 02/15/22 2132 02/16/22 0738  BP: (!) 165/89 (!) 182/94 (!) 161/91 (!) 163/95  Pulse: 92 88 83 87  Resp: 15   16  Temp: 98 F (36.7 C)   98.4 F (36.9 C)  TempSrc:    Oral  SpO2: 99%   99%  Weight:      Height:       SpO2: 99 %   Intake/Output Summary (Last 24 hours) at 02/16/2022 0944 Last data filed at 02/16/2022 0659 Gross per 24 hour  Intake 100 ml  Output 1000 ml  Net -900 ml   Filed Weights   02/14/22 1027 02/14/22 1032  Weight: 77.1 kg 77 kg    Exam: General exam: In no acute  distress. Respiratory system: Good air movement and clear to auscultation. Cardiovascular system: S1 & S2 heard, RRR. No JVD. Gastrointestinal system: Abdomen is nondistended, soft and nontender.  Extremities: No pedal edema. Skin: No rashes, lesions or ulcers Psychiatry: Judgement and insight appear normal. Mood & affect appropriate.   Data Reviewed:    Labs: Basic Metabolic Panel: Recent Labs  Lab 02/14/22 1102 02/15/22 0429 02/16/22 0356  NA 131* 136 135  K 5.4* 5.0 4.4  CL 100 108 110  CO2 19* 16* 18*  GLUCOSE 375* 425* 297*  BUN 40* 42* 44*  CREATININE 3.74* 3.69* 3.59*  CALCIUM 8.9 8.7* 8.4*    GFR Estimated Creatinine Clearance: 28.6 mL/min (A) (by C-G formula based on SCr of 3.59 mg/dL (H)). Liver Function  Tests: Recent Labs  Lab 02/14/22 1102  AST 32  ALT 24  ALKPHOS 132*  BILITOT 0.7  PROT 9.9*  ALBUMIN 3.2*    Recent Labs  Lab 02/14/22 1102  LIPASE 23    No results for input(s): "AMMONIA" in the last 168 hours. Coagulation profile No results for input(s): "INR", "PROTIME" in the last 168 hours. COVID-19 Labs  Recent Labs    02/14/22 1413  CRP 2.9*     No results found for: "SARSCOV2NAA"  CBC: Recent Labs  Lab 02/14/22 1102 02/15/22 0429 02/16/22 0356  WBC 9.7 12.7* 10.3  NEUTROABS 8.1*  --   --   HGB 9.7* 9.3* 8.6*  HCT 31.7* 30.4* 27.7*  MCV 84.8 84.0 84.5  PLT 384 370 329    Cardiac Enzymes: No results for input(s): "CKTOTAL", "CKMB", "CKMBINDEX", "TROPONINI" in the last 168 hours. BNP (last 3 results) No results for input(s): "PROBNP" in the last 8760 hours. CBG: Recent Labs  Lab 02/15/22 1809 02/15/22 2048 02/16/22 0149 02/16/22 0653 02/16/22 0739  GLUCAP 212* 158* 258* 240* 239*    D-Dimer: No results for input(s): "DDIMER" in the last 72 hours. Hgb A1c: Recent Labs    02/15/22 0429  HGBA1C 6.4*   Lipid Profile: No results for input(s): "CHOL", "HDL", "LDLCALC", "TRIG", "CHOLHDL", "LDLDIRECT" in the last 72 hours. Thyroid function studies: No results for input(s): "TSH", "T4TOTAL", "T3FREE", "THYROIDAB" in the last 72 hours.  Invalid input(s): "FREET3" Anemia work up: No results for input(s): "VITAMINB12", "FOLATE", "FERRITIN", "TIBC", "IRON", "RETICCTPCT" in the last 72 hours. Sepsis Labs: Recent Labs  Lab 02/14/22 1102 02/14/22 1412 02/15/22 0429 02/16/22 0356  PROCALCITON  --  0.25 0.27 0.27  WBC 9.7  --  12.7* 10.3  LATICACIDVEN 2.1*  --  1.0  --     Microbiology Recent Results (from the past 240 hour(s))  MRSA Next Gen by PCR, Nasal     Status: None   Collection Time: 02/14/22  2:12 PM   Specimen: Nasal Mucosa; Nasal Swab  Result Value Ref Range Status   MRSA by PCR Next Gen NOT DETECTED NOT DETECTED Final     Comment: (NOTE) The GeneXpert MRSA Assay (FDA approved for NASAL specimens only), is one component of a comprehensive MRSA colonization surveillance program. It is not intended to diagnose MRSA infection nor to guide or monitor treatment for MRSA infections. Test performance is not FDA approved in patients less than 76 years old. Performed at [facility], [address] [place], [place] [address]   Culture, blood (Routine X 2) w Reflex to ID Panel     Status: None (Preliminary result)   Collection Time: 02/14/22  2:57 PM   Specimen: BLOOD  Result Value  Ref Range Status   Specimen Description BLOOD RIGHT ANTECUBITAL  Final   Special Requests   Final    BOTTLES DRAWN AEROBIC AND ANAEROBIC Blood Culture results may not be optimal due to an excessive volume of blood received in culture bottles   Culture   Final    NO GROWTH 2 DAYS Performed at [facility], [address], [place], [place] [address]    Report Status PENDING  Incomplete  Respiratory (~20 pathogens) panel by PCR     Status: None   Collection Time: 02/15/22  4:00 PM   Specimen: Respiratory  Result Value Ref Range Status   Adenovirus NOT DETECTED NOT DETECTED Final   Coronavirus 229E NOT DETECTED NOT DETECTED Final    Comment: (NOTE) The Coronavirus on the Respiratory Panel, DOES NOT test for the novel  Coronavirus (2019 nCoV)    Coronavirus HKU1 NOT DETECTED NOT DETECTED Final   Coronavirus NL63 NOT DETECTED NOT DETECTED Final   Coronavirus OC43 NOT DETECTED NOT DETECTED Final   Metapneumovirus NOT DETECTED NOT DETECTED Final   Rhinovirus / Enterovirus NOT DETECTED NOT DETECTED Final   Influenza A NOT DETECTED NOT DETECTED Final   Influenza B NOT DETECTED NOT DETECTED Final   Parainfluenza Virus 1 NOT DETECTED NOT DETECTED Final   Parainfluenza Virus 2 NOT DETECTED NOT DETECTED Final   Parainfluenza Virus 3 NOT DETECTED NOT DETECTED Final   Parainfluenza Virus 4 NOT DETECTED NOT DETECTED Final    Respiratory Syncytial Virus NOT DETECTED NOT DETECTED Final   Bordetella pertussis NOT DETECTED NOT DETECTED Final   Bordetella Parapertussis NOT DETECTED NOT DETECTED Final   Chlamydophila pneumoniae NOT DETECTED NOT DETECTED Final   Mycoplasma pneumoniae NOT DETECTED NOT DETECTED Final    Comment: Performed at [facility], [address] [place], [place] [address]  Culture, blood (Routine X 2) w Reflex to ID Panel     Status: None (Preliminary result)   Collection Time: 02/15/22  5:47 PM   Specimen: BLOOD  Result Value Ref Range Status   Specimen Description BLOOD LEFT HAND  Final   Special Requests   Final    BOTTLES DRAWN AEROBIC AND ANAEROBIC Blood Culture adequate volume   Culture   Final    NO GROWTH < 24 HOURS Performed at [facility], [address] [place], [place] [address]    Report Status PENDING  Incomplete     Medications:    amLODipine  5 mg Oral Daily   insulin aspart  0-5 Units Subcutaneous QHS   insulin aspart  0-9 Units Subcutaneous Q4H   insulin aspart  3 Units Subcutaneous TID WC   insulin detemir  10 Units Subcutaneous BID   methadone  20 mg Oral Q12H   pantoprazole (PROTONIX) IV  40 mg Intravenous Q12H   vancomycin variable dose per unstable renal function (pharmacist dosing)   Does not apply See admin instructions   Continuous Infusions:  ceFEPime (MAXIPIME) IV Stopped (02/15/22 1440)      LOS: 0 days   Charlynne Cousins  [facility]  02/16/2022, 9:44 AM

## 2022-02-16 NOTE — Progress Notes (Signed)
Initial Nutrition Assessment  DOCUMENTATION CODES:   Not applicable  INTERVENTION:   -MVI with minerals daily -Glucerna Shake po BID, each supplement provides 220 kcal and 10 grams of protein  -Ensure Max po daily, each supplement provides 150 kcal and 30 grams of protein -Liberalize diet to carb modified for wider variety of meal selections  NUTRITION DIAGNOSIS:   Inadequate oral intake related to poor appetite as evidenced by per patient/family report.  GOAL:   Patient will meet greater than or equal to 90% of their needs  MONITOR:   PO intake, Supplement acceptance  REASON FOR ASSESSMENT:   Consult Assessment of nutrition requirement/status  ASSESSMENT:   Pt with past medical history of insulin-dependent diabetes mellitus type 1 comes in for nausea and vomiting,  Pt admitted with intractable nausea and vomiting.   Reviewed I/O's: -900 ml x 24 hours  UOP: 1 L x 24 hours  Per MD notes, suspicious for DM gastroparesis or cannabis hyperemesis syndrome.   Spoke with pt and fiance at bedside. Pt reports feeling a little better today. He was unable keep anything down yesterday when he was admitted to the hospital. Pt reports that he still has a poor appetite, however, was able to keep down some water and applesauce today.   PTA pt reports he eats one large meal per day, which usually consists of a meat and starch. Pt often goes out to eat, but his fiance also cooks; pt admits that he is a very picky eater. He shares that he drinks a lot of regular soda and does not like diet drinks due to the aftertaste of artificial sweeteners. He shares that he plans to drink more unsweetened tea with his preferred low calorie sweetener [facility]). Discussed impact that high sugar beverages have on CBGS as well as lower calorie beverage options.   Pt denies any weight loss "I've been the same weight since I was about 14").   Discussed importance of good meal and supplement intake  to promote healing. Pt amenable to supplements.   Medications reviewed and include reglan.   Albumin has a half-life of 21 days and is strongly affected by stress response and inflammatory process, therefore, do not expect to see an improvement in this lab value during acute hospitalization. When a patient presents with low albumin, it is likely skewed due to the acute inflammatory response.  Unless it is suspected that patient had poor PO intake or malnutrition prior to admission, then RD should not be consulted solely for low albumin. Note that low albumin is no longer used to diagnose malnutrition; [facility] uses the new malnutrition guidelines published by the American Society for Parenteral and Enteral Nutrition (A.S.P.E.N.) and the Academy of Nutrition and Dietetics (AND).     Lab Results  Component Value Date   HGBA1C 6.4 (H) 02/15/2022   PTA DM medications are 4-5 units insulin regular daily. Per pt, he does not ave insurance and his mother buys him his insulin OTC.    Labs reviewed: CBGS: 118-258 (inpatient orders for glycemic control are 0-9 units insulin aspart every 4 hours, 0-5 units insulin aspart daily at bedtime, 3 units insulin aspart TID with meals, and 20 units insulin detemir BID).     NUTRITION - FOCUSED PHYSICAL EXAM:  Flowsheet Row Most Recent Value  Orbital Region No depletion  Upper Arm Region No depletion  Thoracic and Lumbar Region No depletion  Buccal Region No depletion  Temple Region No depletion  Clavicle Bone Region  No depletion  Clavicle and Acromion Bone Region No depletion  Scapular Bone Region No depletion  Dorsal Hand No depletion  Patellar Region No depletion  Anterior Thigh Region No depletion  Posterior Calf Region No depletion  Edema (RD Assessment) None  Hair Reviewed  Eyes Reviewed  Mouth Reviewed  Skin Reviewed  Nails Reviewed       Diet Order:   Diet Order             Diet Carb Modified Fluid consistency: Thin; Room service  appropriate? Yes  Diet effective now                   EDUCATION NEEDS:   Education needs have been addressed  Skin:  Skin Assessment: Reviewed RN Assessment  Last BM:  Unknown  Height:   Ht Readings from Last 1 Encounters:  02/14/22 5\' 8"  (1.727 m)    Weight:   Wt Readings from Last 1 Encounters:  02/14/22 77 kg    Ideal Body Weight:  70 kg  BMI:  Body mass index is 25.81 kg/m.  Estimated Nutritional Needs:   Kcal:  1900-2100  Protein:  100-115 grams  Fluid:  > 1.9 L    Loistine Chance, RD, LDN, [place] Registered Dietitian II Certified Diabetes Care and Education Specialist Please refer to [facility] for RD and/or RD on-call/weekend/after hours pager

## 2022-02-16 NOTE — Plan of Care (Signed)

## 2022-02-16 NOTE — Inpatient Diabetes Management (Signed)
Inpatient Diabetes Program Recommendations  AACE/ADA: New Consensus Statement on Inpatient Glycemic Control   Target Ranges:  Prepandial:   less than 140 mg/dL      Peak postprandial:   less than 180 mg/dL (1-2 hours)      Critically ill patients:  140 - 180 mg/dL    Latest Reference Range & Units 02/15/22 08:18 02/15/22 11:20 02/15/22 14:25 02/15/22 16:09 02/15/22 18:09 02/15/22 20:48 02/16/22 01:49 02/16/22 06:53  Glucose-Capillary 70 - 99 mg/dL 325 (H) 207 (H) 87 233 (H) 212 (H) 158 (H) 258 (H) 240 (H)   Review of Glycemic Control  Diabetes history: DM1 (makes NO insulin) Outpatient Diabetes medications: Novolin R 4-5 units TID with meals Current orders for Inpatient glycemic control: Levemir 10 units BID, Novolog 0-9 units TID with meals, Novolog 0-5 units QHS, Novolog 3 units TID with meals  Inpatient Diabetes Program Recommendations:    Insulin: Please consider increasing Levemir to 12 units BID and meal coverage to Novolog 5 units TID with meals if patient eats at least 50% of meals.  Thanks, Barnie Alderman, RN, MSN, [facility] Diabetes Coordinator Inpatient Diabetes Program [phone] (Team Pager from 8am to [place])

## 2022-02-17 DIAGNOSIS — F1190 Opioid use, unspecified, uncomplicated: Secondary | ICD-10-CM

## 2022-02-17 DIAGNOSIS — R1114 Bilious vomiting: Secondary | ICD-10-CM | POA: Diagnosis not present

## 2022-02-17 DIAGNOSIS — N184 Chronic kidney disease, stage 4 (severe): Secondary | ICD-10-CM

## 2022-02-17 DIAGNOSIS — D631 Anemia in chronic kidney disease: Secondary | ICD-10-CM

## 2022-02-17 LAB — BASIC METABOLIC PANEL
Anion gap: 6 (ref 5–15)
BUN: 43 mg/dL — ABNORMAL HIGH (ref 6–20)
CO2: 21 mmol/L — ABNORMAL LOW (ref 22–32)
Calcium: 8.4 mg/dL — ABNORMAL LOW (ref 8.9–10.3)
Chloride: 107 mmol/L (ref 98–111)
Creatinine, Ser: 3.56 mg/dL — ABNORMAL HIGH (ref 0.61–1.24)
GFR, Estimated: 22 mL/min — ABNORMAL LOW (ref >60)
Glucose, Bld: 206 mg/dL — ABNORMAL HIGH (ref 70–99)
Potassium: 4.2 mmol/L (ref 3.5–5.1)
Sodium: 134 mmol/L — ABNORMAL LOW (ref 135–145)

## 2022-02-17 LAB — GLUCOSE, CAPILLARY
Glucose-Capillary: 184 mg/dL — ABNORMAL HIGH (ref 70–99)
Glucose-Capillary: 169 mg/dL — ABNORMAL HIGH (ref 70–99)
Glucose-Capillary: 125 mg/dL — ABNORMAL HIGH (ref 70–99)
Glucose-Capillary: 134 mg/dL — ABNORMAL HIGH (ref 70–99)
Glucose-Capillary: 137 mg/dL — ABNORMAL HIGH (ref 70–99)

## 2022-02-17 LAB — HEPATITIS B SURFACE ANTIGEN: Hepatitis B Surface Ag: NONREACTIVE

## 2022-02-17 MED ORDER — PANTOPRAZOLE SODIUM 40 MG PO TBEC: 40.0000 mg | DELAYED_RELEASE_TABLET | Freq: Every day | ORAL | 1 refills | Status: DC

## 2022-02-17 MED ORDER — METOCLOPRAMIDE HCL 10 MG PO TABS: 10.0000 mg | ORAL_TABLET | Freq: Three times a day (TID) | ORAL | 1 refills | Status: DC | PRN

## 2022-02-17 MED ORDER — "PEN NEEDLES 5/16"" 31G X 8 MM MISC": 1.0000 | 1 refills | Status: DC

## 2022-02-17 MED ORDER — INSULIN ISOPHANE & REGULAR (HUMAN 70-30)100 UNIT/ML KWIKPEN: 18.0000 [IU] | PEN_INJECTOR | Freq: Two times a day (BID) | SUBCUTANEOUS | 11 refills | Status: DC

## 2022-02-17 MED ORDER — AMLODIPINE BESYLATE 5 MG PO TABS: 5.0000 mg | ORAL_TABLET | Freq: Every day | ORAL | 1 refills | Status: DC

## 2022-02-17 NOTE — Discharge Summary (Signed)
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 Tobie Poet He reports that he is coming in for nausea and vomiting that happened today.  He does not know what he is vomiting up.  He is minimally verbal and does not want to make eye contact with me.  He prefers that his mother talk for him.  He appears mildly impatient and agitated at my questions in order to complete the HPI.   He does not appear to be in acute distress.   Patient and family denies anybody else getting sick.  They just got back from the beach and had seafood though no one in the family had nausea or vomiting.  He denies dysuria, diarrhea.   He reports that his left ankle has been bothering him for a long time now nearly a year and was advised to have it amputated however they were not ready at that time.  Patient endorses requests to have orthopedic evaluation for second opinion.   He also endorses subjective fever at home though they were not able to tell me the temperature.   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 Cyclic vomiting also possible. Resolved spontaneously and tolerating diet with benign abdomen. DM educator, advised d/c on 70/30 which was ordered. Patient has substance use disorder that has interfered with medical care for a number of years and unfortunately now has severe complications. One of those is what appears to be ckd stage 4. Another is charcot foot. In addition to establishing with a pcp (patient was given options), will refer to nephrology, podiatry, and endocrinology.   Procedures: none   Consultations: none  Discharge Exam: Vitals:   02/17/22 0753 02/17/22 1124  BP: (!) 144/84 (!) 167/94  Pulse: 77 86  Resp: 16 18  Temp: 97.6 F (36.4 C) 98.5 F (36.9 C)  SpO2: 99% 100%    General: chronically ill appearing Cardiovascular: RRR Respiratory: CTAB  Discharge Instructions   Discharge Instructions     Ambulatory referral to Endocrinology   Complete by: As directed    Ambulatory referral to Nephrology   Complete by: As directed    Ambulatory referral to Podiatry   Complete by: As directed    Diet Carb Modified   Complete by: As directed    Increase activity slowly   Complete by: As directed       Allergies as of 02/17/2022       Reactions   Augmentin [amoxicillin-pot Clavulanate] Nausea And Vomiting   Sulfa Antibiotics Nausea And Vomiting  Medication List     STOP taking these medications    insulin regular 100 units/mL injection Commonly known as: NOVOLIN R       TAKE these medications    amLODipine 5 MG tablet Commonly known as: NORVASC Take 1 tablet (5 mg total) by mouth daily. Start taking on: February 18, 2022   insulin isophane & regular human KwikPen (70-30) 100 UNIT/ML KwikPen Commonly known as: HUMULIN 70/30 MIX Inject 18 Units into the skin 2 (two) times daily. Before breakfast and dinner   metoCLOPramide 10 MG tablet Commonly known as: REGLAN Take 1 tablet (10 mg total) by mouth every 8  (eight) hours as needed for nausea.   pantoprazole 40 MG tablet Commonly known as: Protonix Take 1 tablet (40 mg total) by mouth daily.   PEN NEEDLES 31GX5/16" 31G X 8 MM Misc 1 each by Does not apply route as directed.       Allergies  Allergen Reactions   Augmentin [Amoxicillin-Pot Clavulanate] Nausea And Vomiting   Sulfa Antibiotics Nausea And Vomiting      The results of significant diagnostics from this hospitalization (including imaging, microbiology, ancillary and laboratory) are listed below for reference.    Significant Diagnostic Studies: DG Ankle Complete Left  Result Date: 02/14/2022 CLINICAL DATA:  Diabetic with swelling EXAM: LEFT ANKLE COMPLETE - 3+ VIEW; LEFT FOOT - COMPLETE 3+ VIEW COMPARISON:  None Available. FINDINGS: There is extensive marked bone destruction of the distal tibia, fibula and talus with complete obliteration of the ankle joint. Similar findings of bone destruction and distortion throughout the midfoot involving the anterior half of the calcaneus to the proximal metatarsals. Extensive associated callus formation/periosteal reaction about the ankle. Severe soft tissue swelling with deformity of the ankle and foot. Arterial vascular calcifications noted. 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 Electronically Signed   By: Ofilia Neas M.D.   On: 02/14/2022 13:13   DG Foot Complete Left  Result Date: 02/14/2022 CLINICAL DATA:  Diabetic with swelling EXAM: LEFT ANKLE COMPLETE - 3+ VIEW; LEFT FOOT - COMPLETE 3+ VIEW COMPARISON:  None Available. FINDINGS: There is extensive marked bone destruction of the distal tibia, fibula and talus with complete obliteration of the ankle joint. Similar findings of bone destruction and distortion throughout the midfoot involving the anterior half of the calcaneus to the proximal metatarsals. Extensive associated callus formation/periosteal reaction about the ankle.  Severe soft tissue swelling with deformity of the ankle and foot. Arterial vascular calcifications noted. 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 Electronically Signed   By: Ofilia Neas M.D.   On: 02/14/2022 13:13   DG Chest Portable 1 View  Result Date: 02/14/2022 CLINICAL DATA:  31 year old male with history of chest pain and shortness of breath. EXAM: PORTABLE CHEST 1 VIEW COMPARISON:  No priors. FINDINGS: Lung volumes are low. Diffuse peribronchial cuffing. No consolidative airspace disease. No pleural effusions. No pneumothorax. No pulmonary nodule or mass noted. Pulmonary vasculature and the cardiomediastinal silhouette are within normal limits. IMPRESSION: 1. Diffuse peribronchial cuffing which may suggest an acute bronchitis. Electronically Signed   By: Vinnie Langton M.D.   On: 02/14/2022 13:00    Microbiology: Recent Results (from the past 240 hour(s))  MRSA Next Gen by PCR, Nasal     Status: None   Collection Time: 02/14/22  2:12 PM   Specimen: Nasal Mucosa; Nasal Swab  Result Value Ref Range Status   MRSA by  PCR Next Gen NOT DETECTED NOT DETECTED Final    Comment: (NOTE) The GeneXpert MRSA Assay (FDA approved for NASAL specimens only), is one component of a comprehensive MRSA colonization surveillance program. It is not intended to diagnose MRSA infection nor to guide or monitor treatment for MRSA infections. Test performance is not FDA approved in patients less than 60 years old. Performed at [facility], [address] [place], [place] [address]   Culture, blood (Routine X 2) w Reflex to ID Panel     Status: None (Preliminary result)   Collection Time: 02/14/22  2:57 PM   Specimen: BLOOD  Result Value Ref Range Status   Specimen Description BLOOD RIGHT ANTECUBITAL  Final   Special Requests   Final    BOTTLES DRAWN AEROBIC AND ANAEROBIC Blood Culture results may not be optimal due to an excessive  volume of blood received in culture bottles   Culture   Final    NO GROWTH 3 DAYS Performed at [facility], [address] [place], [place] [address]    Report Status PENDING  Incomplete  Respiratory (~20 pathogens) panel by PCR     Status: None   Collection Time: 02/15/22  4:00 PM   Specimen: Respiratory  Result Value Ref Range Status   Adenovirus NOT DETECTED NOT DETECTED Final   Coronavirus 229E NOT DETECTED NOT DETECTED Final    Comment: (NOTE) The Coronavirus on the Respiratory Panel, DOES NOT test for the novel  Coronavirus (2019 nCoV)    Coronavirus HKU1 NOT DETECTED NOT DETECTED Final   Coronavirus NL63 NOT DETECTED NOT DETECTED Final   Coronavirus OC43 NOT DETECTED NOT DETECTED Final   Metapneumovirus NOT DETECTED NOT DETECTED Final   Rhinovirus / Enterovirus NOT DETECTED NOT DETECTED Final   Influenza A NOT DETECTED NOT DETECTED Final   Influenza B NOT DETECTED NOT DETECTED Final   Parainfluenza Virus 1 NOT DETECTED NOT DETECTED Final   Parainfluenza Virus 2 NOT DETECTED NOT DETECTED Final   Parainfluenza Virus 3 NOT DETECTED NOT DETECTED Final   Parainfluenza Virus 4 NOT DETECTED NOT DETECTED Final   Respiratory Syncytial Virus NOT DETECTED NOT DETECTED Final   Bordetella pertussis NOT DETECTED NOT DETECTED Final   Bordetella Parapertussis NOT DETECTED NOT DETECTED Final   Chlamydophila pneumoniae NOT DETECTED NOT DETECTED Final   Mycoplasma pneumoniae NOT DETECTED NOT DETECTED Final    Comment: Performed at [facility], [place] [address], [place], [place] [address]  Culture, blood (Routine X 2) w Reflex to ID Panel     Status: None (Preliminary result)   Collection Time: 02/15/22  5:47 PM   Specimen: BLOOD  Result Value Ref Range Status   Specimen Description BLOOD LEFT HAND  Final   Special Requests   Final    BOTTLES DRAWN AEROBIC AND ANAEROBIC Blood Culture adequate volume   Culture   Final    NO GROWTH 2 DAYS Performed at [facility], [place], [place] [address]    Report Status PENDING  Incomplete     Labs: Basic Metabolic Panel: Recent Labs  Lab 02/14/22 1102 02/15/22 0429 02/16/22 0356 02/17/22 0408  NA 131* 136 135 134*  K 5.4* 5.0 4.4 4.2  CL 100 108 110 107  CO2 19* 16* 18* 21*  GLUCOSE 375* 425* 297* 206*  BUN 40* 42* 44* 43*  CREATININE 3.74* 3.69* 3.59* 3.56*  CALCIUM 8.9 8.7* 8.4* 8.4*   Liver Function Tests: Recent Labs  Lab 02/14/22 1102  AST 32  ALT 24  ALKPHOS 132*  BILITOT 0.7  PROT 9.9*  ALBUMIN 3.2*   Recent Labs  Lab 02/14/22 1102  LIPASE 23   No results for input(s): "AMMONIA" in the last 168 hours. CBC: Recent Labs  Lab 02/14/22 1102 02/15/22 0429 02/16/22 0356  WBC 9.7 12.7* 10.3  NEUTROABS 8.1*  --   --   HGB 9.7* 9.3* 8.6*  HCT 31.7* 30.4* 27.7*  MCV 84.8 84.0 84.5  PLT 384 370 329   Cardiac Enzymes: No results for input(s): "CKTOTAL", "CKMB", "CKMBINDEX", "TROPONINI" in the last 168 hours. BNP: BNP (last 3 results) No results for input(s): "BNP" in the last 8760 hours.  ProBNP (last 3 results) No results for input(s): "PROBNP" in the last 8760 hours.  CBG: Recent Labs  Lab 02/16/22 2000 02/16/22 2157 02/17/22 0321 02/17/22 0649 02/17/22 0900  GLUCAP 123* 92 184* 169* 125*       Signed:  Desma Maxim MD.  [facility] 02/17/2022, 11:40 AM

## 2022-02-17 NOTE — Inpatient Diabetes Management (Signed)
Inpatient Diabetes Program Recommendations  AACE/ADA: New Consensus Statement on Inpatient Glycemic Control (2015)  Target Ranges:  Prepandial:   less than 140 mg/dL      Peak postprandial:   less than 180 mg/dL (1-2 hours)      Critically ill patients:  140 - 180 mg/dL    Latest Reference Range & Units 02/17/22 03:21 02/17/22 06:49 02/17/22 09:00  Glucose-Capillary 70 - 99 mg/dL 184 (H)  Novolog 2 units 169 (H)  Novolog 5 units 125 (H)    Levemir 20 units    Latest Reference Range & Units 02/16/22 07:39 02/16/22 09:03 02/16/22 11:44 02/16/22 16:21 02/16/22 20:00 02/16/22 21:57  Glucose-Capillary 70 - 99 mg/dL 239 (H)  Novolog 3 units    Levemir 10 units  118 (H)  Novolog 3 units 114 (H)  Novolog 3 units 123 (H)  Novolog 1 units 92   Levemir 20 units    Review of Glycemic Control  Diabetes history: DM1 (makes NO insulin) Outpatient Diabetes medications: Novolin R 4-5 units TID with meals Current orders for Inpatient glycemic control: Levemir 20 units BID, Novolog 0-9 units TID with meals, Novolog 0-5 units QHS, Novolog 3 units TID with meals   Inpatient Diabetes Program Recommendations:     Insulin: Levemir increased from 10 units BID to 20 units BID on 02/16/22. CBG 125 mg/dl this morning and patient has already received Levemir 20 units this morning. Concerned about potential hypoglycemia given significant increase in basal insulin in patient with Type 1 DM.  Please consider decreasing Levemir to 13 units BID.   Outpatient: Per TOC note, patient is not eligible to get medication assistance through Medication Management clinic. Therefore, please consider discharging on Novolin 70/30 18 units BID (dose will provide 25.2 units for basal and 10.8 units for meal coverage per day).  Thanks, Barnie Alderman, RN, MSN, [place] Diabetes Coordinator Inpatient Diabetes Program [phone] (Team Pager from 8am to [place])

## 2022-02-18 LAB — RPR: RPR Ser Ql: NONREACTIVE

## 2022-02-19 LAB — CULTURE, BLOOD (ROUTINE X 2): Culture: NO GROWTH

## 2022-02-20 LAB — CULTURE, BLOOD (ROUTINE X 2)
Culture: NO GROWTH
Special Requests: ADEQUATE

## 2022-02-26 ENCOUNTER — Telehealth: Payer: Self-pay | Admitting: Obstetrics and Gynecology

## 2022-02-26 DIAGNOSIS — B1920 Unspecified viral hepatitis C without hepatic coma: Secondary | ICD-10-CM | POA: Insufficient documentation

## 2022-02-26 DIAGNOSIS — B182 Chronic viral hepatitis C: Secondary | ICD-10-CM

## 2022-02-26 LAB — HCV AB W REFLEX TO QUANT PCR: HCV Ab: REACTIVE — AB

## 2022-02-26 LAB — HCV RT-PCR, QUANT (NON-GRAPH)
HCV log10: 5.524 log10 IU/mL
Hepatitis C Quantitation: 334000 IU/mL

## 2022-02-26 NOTE — Telephone Encounter (Signed)
Called patient regarding hep c positive. He says he's aware of the diagnosis (just wasn't on our problem list). He has f/u with a new pcp scheduled for august. I advised addressing his chronic hep c.

## 2022-03-08 ENCOUNTER — Ambulatory Visit: Payer: Commercial Managed Care - HMO | Admitting: Podiatry

## 2022-03-08 ENCOUNTER — Telehealth: Payer: Self-pay

## 2022-03-08 ENCOUNTER — Encounter: Payer: Self-pay | Admitting: Podiatry

## 2022-03-08 DIAGNOSIS — M14672 Charcot's joint, left ankle and foot: Secondary | ICD-10-CM

## 2022-03-08 DIAGNOSIS — M79675 Pain in left toe(s): Secondary | ICD-10-CM

## 2022-03-08 DIAGNOSIS — M79674 Pain in right toe(s): Secondary | ICD-10-CM | POA: Diagnosis not present

## 2022-03-08 DIAGNOSIS — B351 Tinea unguium: Secondary | ICD-10-CM

## 2022-03-08 DIAGNOSIS — E1042 Type 1 diabetes mellitus with diabetic polyneuropathy: Secondary | ICD-10-CM | POA: Diagnosis not present

## 2022-03-08 NOTE — Progress Notes (Signed)
  Subjective:  Patient ID: Jason Francis, male    DOB: [birth year],  MRN: [id number]  Chief Complaint  Patient presents with   Diabetes     (New Patient) Diagnosis: E10.69 (ICD-10-CM) - Type 1 diabetes mellitus with other specified complication ([facility])U27.2 (ICD-10-CM) - CKD (chronic kidney disease) stage 4, GFR 15-29 ml/min (HCC) Z36.644 (ICD-10-CM) - Charcot ankle, left Referring Provider: Laurey Arrow [place]    31 y.o. male presents with the above complaint. History confirmed with patient.  He was referred after being hospitalized for swelling pain and discomfort in his left ankle.  He says it has been this way and floppy for several months now.  He has had multiple infections in the left foot before and this developed shortly after surgery for these.  Objective:  Physical Exam: warm, good capillary refill, normal DP and PT pulses, and complete loss of protective sensation in the foot there is onychomycosis x10 with thickened elongated dystrophic nails with subungual debris, there is complete instability of the ankle joint with swelling and gross valgus deformity.   Radiographs: Left foot and ankle x-rays taken at the hospital show severe ankle Charcot with destruction of the tibiotalar joint and fracture of the fibula and talus Assessment:   1. Charcot's joint of left ankle   2. Pain due to onychomycosis of toenails of both feet   3. 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  I discussed with him the etiology and treatment options for Charcot deformity.  I do not think this is reconstructable.  I recommend below-knee amputation and discussed this with him.  We discussed the rationale for this and that he would be better with amputation and progression to a below-knee prosthetic and he understands the rationale for this and agrees.  I have sent him a referral for this to be done with the [facility] here in  [place].  Patient educated on diabetes. Discussed proper diabetic foot care and discussed risks and complications of disease. Educated patient in depth on reasons to return to the office immediately should he/she discover anything concerning or new on the feet. All questions answered. Discussed proper shoes as well.   Discussed the etiology and treatment options for the condition in detail with the patient. Educated patient on the topical and oral treatment options for mycotic nails. Recommended debridement of the nails today. Sharp and mechanical debridement performed of all painful and mycotic nails today. Nails debrided in length and thickness using a nail nipper to level of comfort. Discussed treatment options including appropriate shoe gear. Follow up as needed for painful nails.    Return if symptoms worsen or fail to improve.

## 2022-03-08 NOTE — Telephone Encounter (Signed)
Referral and demographics faxed to [facility] @ [phone]

## 2022-03-09 ENCOUNTER — Other Ambulatory Visit: Payer: Self-pay | Admitting: Nephrology

## 2022-03-09 DIAGNOSIS — E1022 Type 1 diabetes mellitus with diabetic chronic kidney disease: Secondary | ICD-10-CM

## 2022-03-19 ENCOUNTER — Ambulatory Visit
Admission: RE | Admit: 2022-03-19 | Discharge: 2022-03-19 | Disposition: A | Discharge status: Home / Self Care | Payer: Commercial Managed Care - HMO | Source: Ambulatory Visit | Attending: Nephrology | Admitting: Nephrology

## 2022-03-19 DIAGNOSIS — E1022 Type 1 diabetes mellitus with diabetic chronic kidney disease: Secondary | ICD-10-CM | POA: Insufficient documentation

## 2022-03-19 DIAGNOSIS — N184 Chronic kidney disease, stage 4 (severe): Secondary | ICD-10-CM | POA: Diagnosis present

## 2022-04-09 ENCOUNTER — Encounter: Payer: Self-pay | Admitting: Nurse Practitioner

## 2022-04-09 ENCOUNTER — Other Ambulatory Visit: Payer: Self-pay | Admitting: Nurse Practitioner

## 2022-04-09 ENCOUNTER — Ambulatory Visit (INDEPENDENT_AMBULATORY_CARE_PROVIDER_SITE_OTHER): Payer: Commercial Managed Care - HMO | Admitting: Nurse Practitioner

## 2022-04-09 VITALS — BP 160/85 | HR 86 | Temp 98.3°F | Resp 16 | Ht 68.0 in | Wt 177.0 lb

## 2022-04-09 DIAGNOSIS — E1022 Type 1 diabetes mellitus with diabetic chronic kidney disease: Secondary | ICD-10-CM | POA: Diagnosis not present

## 2022-04-09 DIAGNOSIS — S161XXA Strain of muscle, fascia and tendon at neck level, initial encounter: Secondary | ICD-10-CM

## 2022-04-09 DIAGNOSIS — N184 Chronic kidney disease, stage 4 (severe): Secondary | ICD-10-CM

## 2022-04-09 DIAGNOSIS — E1159 Type 2 diabetes mellitus with other circulatory complications: Secondary | ICD-10-CM | POA: Diagnosis not present

## 2022-04-09 DIAGNOSIS — M86372 Chronic multifocal osteomyelitis, left ankle and foot: Secondary | ICD-10-CM | POA: Diagnosis not present

## 2022-04-09 DIAGNOSIS — Z760 Encounter for issue of repeat prescription: Secondary | ICD-10-CM

## 2022-04-09 DIAGNOSIS — Z7689 Persons encountering health services in other specified circumstances: Secondary | ICD-10-CM

## 2022-04-09 DIAGNOSIS — I152 Hypertension secondary to endocrine disorders: Secondary | ICD-10-CM

## 2022-04-09 DIAGNOSIS — E1143 Type 2 diabetes mellitus with diabetic autonomic (poly)neuropathy: Secondary | ICD-10-CM

## 2022-04-09 DIAGNOSIS — K219 Gastro-esophageal reflux disease without esophagitis: Secondary | ICD-10-CM

## 2022-04-09 MED ORDER — CYCLOBENZAPRINE HCL 10 MG PO TABS: 10.0000 mg | ORAL_TABLET | Freq: Two times a day (BID) | ORAL | 0 refills | Status: DC | PRN

## 2022-04-09 MED ORDER — PANTOPRAZOLE SODIUM 40 MG PO TBEC: 40.0000 mg | DELAYED_RELEASE_TABLET | Freq: Every day | ORAL | 1 refills | Status: DC

## 2022-04-09 MED ORDER — BENAZEPRIL HCL 10 MG PO TABS
10.0000 mg | ORAL_TABLET | Freq: Every day | ORAL | 1 refills | Status: DC
Start: 2022-04-09 — End: 2022-05-24

## 2022-04-09 MED ORDER — METOCLOPRAMIDE HCL 10 MG PO TABS: 10.0000 mg | ORAL_TABLET | Freq: Two times a day (BID) | ORAL | 1 refills | Status: DC | PRN

## 2022-04-09 MED ORDER — TRAMADOL HCL 50 MG PO TABS: 50.0000 mg | ORAL_TABLET | Freq: Three times a day (TID) | ORAL | 0 refills | Status: AC | PRN

## 2022-04-09 MED ORDER — INSULIN GLARGINE (2 UNIT DIAL) 300 UNIT/ML ~~LOC~~ SOPN: 30.0000 [IU] | PEN_INJECTOR | Freq: Two times a day (BID) | SUBCUTANEOUS | 5 refills | Status: DC

## 2022-04-09 MED ORDER — INSULIN LISPRO 200 UNIT/ML ~~LOC~~ SOPN: 3.0000 [IU] | PEN_INJECTOR | Freq: Three times a day (TID) | SUBCUTANEOUS | 5 refills | Status: DC

## 2022-04-09 NOTE — Progress Notes (Signed)
[facility] [place], [place] [address]  Internal MEDICINE  Office Visit Note  Patient Name: Jason Francis  [id number]  [id number]  Date of Service: 04/09/2022   Complaints/HPI Pt is here for establishment of PCP. Chief Complaint  Patient presents with   New Patient (Initial Visit)   HPI Jason Francis presents for a new patient visit to establish care.  Well-appearing 31 year old male with type 1 diabetes, stage IV CKD, hypertension, gastroparesis, osteomyelitis and Charcot's joint of the left ankle Surgical history includes appendectomy, foot surgery, myringotomy and kidney stent. Is followed by nephrology Smoker, 2 packs/day cigarettes.  Denies any alcohol use.  Confirms daily marijuana use and occasional cocaine use --Has mild to moderate pain in the left side of his neck, states most likely slept on it wrong, could be a pinched nerve or strained muscle -- His diabetes is well controlled despite his other significant medical problems.  A1c is 6.4.  He is on a generic basal insulin and sliding scale insulin -- Due for annual physical exam and some routine labs,  -- Up-to-date on other screenings except for diabetic eye exam and diabetic foot exam.     Current Medication: Outpatient Encounter Medications as of 04/09/2022  Medication Sig   Insulin Pen Needle (PEN NEEDLES 31GX5/16") 31G X 8 MM MISC 1 each by Does not apply route as directed.   [EXPIRED] traMADol (ULTRAM) 50 MG tablet Take 1 tablet (50 mg total) by mouth every 8 (eight) hours as needed for up to 5 days.   [DISCONTINUED] amLODipine (NORVASC) 5 MG tablet Take 1 tablet (5 mg total) by mouth daily.   [DISCONTINUED] benazepril (LOTENSIN) 10 MG tablet Take 1 tablet (10 mg total) by mouth daily.   [DISCONTINUED] cyclobenzaprine (FLEXERIL) 10 MG tablet Take 1 tablet (10 mg total) by mouth 2 (two) times daily as needed for muscle spasms. Take one tab po qhs for back spasm prn only (Patient taking differently:  Take 10 mg by mouth 2 (two) times daily as needed for muscle spasms.)   [DISCONTINUED] insulin glargine, 2 Unit Dial, (TOUJEO MAX) 300 UNIT/ML Solostar Pen Inject 30 Units into the skin in the morning and at bedtime.   [DISCONTINUED] insulin isophane & regular human KwikPen (HUMULIN 70/30 MIX) (70-30) 100 UNIT/ML KwikPen Inject 18 Units into the skin 2 (two) times daily. Before breakfast and dinner   [DISCONTINUED] insulin lispro (HUMALOG) 200 UNIT/ML KwikPen Inject 3-5 Units into the skin 3 (three) times daily with meals. 140-160 give 3 units; 161-199 given 4 units, 200 or greater give 5 units. Max 24 hour dose: 20 units.   [DISCONTINUED] metoCLOPramide (REGLAN) 10 MG tablet Take 1 tablet (10 mg total) by mouth every 8 (eight) hours as needed for nausea.   [DISCONTINUED] pantoprazole (PROTONIX) 40 MG tablet Take 1 tablet (40 mg total) by mouth daily.   metoCLOPramide (REGLAN) 10 MG tablet Take 1 tablet (10 mg total) by mouth 2 (two) times daily as needed for nausea or vomiting.   pantoprazole (PROTONIX) 40 MG tablet Take 1 tablet (40 mg total) by mouth daily.   No facility-administered encounter medications on file as of 04/09/2022.    Surgical History: Past Surgical History:  Procedure Laterality Date   APPENDECTOMY     FOOT SURGERY Left 2019    Medical History: Past Medical History:  Diagnosis Date   Chronic kidney disease    Diabetes mellitus without complication ([place])    Hypertension     Family History: Family History  Problem Relation Age of Onset   Heart disease Mother    Hypertension Mother    Obesity Mother    Heart disease Maternal Grandfather     Social History   Socioeconomic History   Marital status: Single    Spouse name: Not on file   Number of children: Not on file   Years of education: Not on file   Highest education level: Not on file  Occupational History   Not on file  Tobacco Use   Smoking status: Every Day    Packs/day: 2.00    Types: Cigarettes    Smokeless tobacco: Never  Substance and Sexual Activity   Alcohol use: Not Currently   Drug use: Yes    Types: Marijuana, Cocaine    Comment: Occassional cocain use, marijuana daily   Sexual activity: Yes    Partners: Female  Other Topics Concern   Not on file  Social History Narrative   Not on file   Social Determinants of Health   Financial Resource Strain: Not on file  Food Insecurity: Not on file  Transportation Needs: Not on file  Physical Activity: Not on file  Stress: Not on file  Social Connections: Not on file  Intimate Partner Violence: Not on file     Review of Systems  Constitutional:  Negative for chills, fatigue and unexpected weight change.  HENT:  Positive for postnasal drip. Negative for congestion, rhinorrhea, sneezing and sore throat.   Eyes:  Negative for redness.  Respiratory: Negative.  Negative for cough, chest tightness, shortness of breath and wheezing.   Cardiovascular: Negative.  Negative for chest pain and palpitations.  Gastrointestinal:  Negative for abdominal pain, constipation, diarrhea, nausea and vomiting.  Genitourinary:  Negative for dysuria and frequency.  Musculoskeletal:  Positive for arthralgias (Left neck pain), gait problem (Due to Charcot's joint of the left ankle, ambulates with crutches) and neck pain (Left side). Negative for back pain and joint swelling.  Skin:  Negative for rash.  Neurological:  Negative for tremors and numbness.  Hematological:  Negative for adenopathy. Does not bruise/bleed easily.  Psychiatric/Behavioral:  Negative for behavioral problems (Depression), sleep disturbance and suicidal ideas. The patient is not nervous/anxious.     Vital Signs: BP (!) 160/85 Comment: 182/105  Pulse 86   Temp 98.3 F (36.8 C)   Resp 16   Ht 5\' 8"  (1.727 m)   Wt 177 lb (80.3 kg)   SpO2 97%   BMI 26.91 kg/m    Physical Exam Vitals reviewed.  Constitutional:      General: He is not in acute distress.     Appearance: Normal appearance. He is not ill-appearing.  HENT:     Head: Normocephalic and atraumatic.  Eyes:     Pupils: Pupils are equal, round, and reactive to light.  Cardiovascular:     Rate and Rhythm: Normal rate and regular rhythm.  Pulmonary:     Effort: Pulmonary effort is normal. No respiratory distress.  Neurological:     Mental Status: He is alert and oriented to person, place, and time.     Gait: Gait abnormal (Due to Charcot's joint of the left ankle).  Psychiatric:        Mood and Affect: Mood normal.        Behavior: Behavior normal.       Assessment/Plan: 1. Type 1 diabetes mellitus with stage 4 chronic kidney disease (HCC) Well-controlled glucose levels, A1c 6.4.  Significant CKD, closely followed by nephrology, currently not  needing dialysis at this point.  Will try a different basal insulin and generic Humalog for long-acting and sliding scale insulin dosing, follow-up in 2 weeks to discuss changes and effectiveness.  2. Chronic multifocal osteomyelitis of left ankle ([place]) Managed by podiatry, will be discussing amputation soon  3. Hypertension associated with diabetes ([place]) Amlodipine discontinued, benazepril added, 10 mg daily  4. Strain of neck muscle, initial encounter Take Tylenol first, if pain not relieved, may take 1 tramadol every 8 hours as needed.  Encouraged to also use ice or heat to the area for pain relief - traMADol (ULTRAM) 50 MG tablet; Take 1 tablet (50 mg total) by mouth every 8 (eight) hours as needed for up to 5 days.  Dispense: 15 tablet; Refill: 0  5. Medication refill - metoCLOPramide (REGLAN) 10 MG tablet; Take 1 tablet (10 mg total) by mouth 2 (two) times daily as needed for nausea or vomiting.  Dispense: 180 tablet; Refill: 1 - pantoprazole (PROTONIX) 40 MG tablet; Take 1 tablet (40 mg total) by mouth daily.  Dispense: 90 tablet; Refill: 1  6. Encounter to establish care with new doctor Establishing primary care with significant  chronic medical conditions as well as substance abuse including cocaine, marijuana, opioids and nicotine.  He is receiving treatment for opioid use disorder via methadone clinic. patient also has chronic hepatitis C.    General Counseling: Jason Francis understanding of the findings of todays visit and agrees with plan of treatment. I have discussed any further diagnostic evaluation that may be needed or ordered today. We also reviewed his medications today. he has been encouraged to call the office with any questions or concerns that should arise related to todays visit.    Counseling:  [place] Controlled Substance Database was reviewed by me.  No orders of the defined types were placed in this encounter.   Meds ordered this encounter  Medications   DISCONTD: insulin glargine, 2 Unit Dial, (TOUJEO MAX) 300 UNIT/ML Solostar Pen    Sig: Inject 30 Units into the skin in the morning and at bedtime.    Dispense:  6 mL    Refill:  5    Discontinue novolin N and 70/30 insulin, please fill new prescription asap   metoCLOPramide (REGLAN) 10 MG tablet    Sig: Take 1 tablet (10 mg total) by mouth 2 (two) times daily as needed for nausea or vomiting.    Dispense:  180 tablet    Refill:  1   DISCONTD: insulin lispro (HUMALOG) 200 UNIT/ML KwikPen    Sig: Inject 3-5 Units into the skin 3 (three) times daily with meals. 140-160 give 3 units; 161-199 given 4 units, 200 or greater give 5 units. Max 24 hour dose: 20 units.    Dispense:  3 mL    Refill:  5    Discontinue other short acting insuline and fill new prescription today   pantoprazole (PROTONIX) 40 MG tablet    Sig: Take 1 tablet (40 mg total) by mouth daily.    Dispense:  90 tablet    Refill:  1   DISCONTD: cyclobenzaprine (FLEXERIL) 10 MG tablet    Sig: Take 1 tablet (10 mg total) by mouth 2 (two) times daily as needed for muscle spasms. Take one tab po qhs for back spasm prn only    Dispense:  60 tablet    Refill:  0   traMADol  (ULTRAM) 50 MG tablet    Sig: Take 1 tablet (50 mg total) by  mouth every 8 (eight) hours as needed for up to 5 days.    Dispense:  15 tablet    Refill:  0   DISCONTD: benazepril (LOTENSIN) 10 MG tablet    Sig: Take 1 tablet (10 mg total) by mouth daily.    Dispense:  30 tablet    Refill:  1    Please discontinue amlodipine and fill new prescription today.    Return in about 2 weeks (around 04/23/2022) for F/U, eval new med, BP check, Jason Francis PCP and insulin adjustments.  Time spent:30 Minutes Time spent with patient included reviewing progress notes, labs, imaging studies, and discussing plan for follow up.   [place] Controlled Substance Database was reviewed by me for overdose risk score (ORS)   This patient was seen by Jason Osgood, FNP-C in collaboration with Jason Francis as a part of collaborative care agreement.    Jason John R. Valetta Fuller, MSN, FNP-C Internal Medicine

## 2022-04-22 ENCOUNTER — Encounter: Payer: Self-pay | Admitting: Nurse Practitioner

## 2022-04-22 ENCOUNTER — Ambulatory Visit: Payer: Commercial Managed Care - HMO | Admitting: Nurse Practitioner

## 2022-04-22 VITALS — BP 160/100 | HR 93 | Temp 98.2°F | Resp 16 | Ht 68.0 in | Wt 180.0 lb

## 2022-04-22 DIAGNOSIS — N184 Chronic kidney disease, stage 4 (severe): Secondary | ICD-10-CM

## 2022-04-22 DIAGNOSIS — E1143 Type 2 diabetes mellitus with diabetic autonomic (poly)neuropathy: Secondary | ICD-10-CM

## 2022-04-22 DIAGNOSIS — K219 Gastro-esophageal reflux disease without esophagitis: Secondary | ICD-10-CM

## 2022-04-22 DIAGNOSIS — E1022 Type 1 diabetes mellitus with diabetic chronic kidney disease: Secondary | ICD-10-CM | POA: Diagnosis not present

## 2022-04-22 DIAGNOSIS — M86372 Chronic multifocal osteomyelitis, left ankle and foot: Secondary | ICD-10-CM | POA: Diagnosis not present

## 2022-04-22 DIAGNOSIS — M14672 Charcot's joint, left ankle and foot: Secondary | ICD-10-CM | POA: Diagnosis not present

## 2022-04-22 DIAGNOSIS — S161XXA Strain of muscle, fascia and tendon at neck level, initial encounter: Secondary | ICD-10-CM

## 2022-04-22 MED ORDER — INSULIN REGULAR HUMAN 100 UNIT/ML IJ SOLN: 4.0000 [IU] | Freq: Three times a day (TID) | INTRAMUSCULAR | 6 refills | Status: DC

## 2022-04-22 MED ORDER — CYCLOBENZAPRINE HCL 10 MG PO TABS: 10.0000 mg | ORAL_TABLET | Freq: Two times a day (BID) | ORAL | 3 refills | Status: DC | PRN

## 2022-04-22 MED ORDER — NOVOLIN N 100 UNIT/ML ~~LOC~~ SUSP: 30.0000 [IU] | Freq: Two times a day (BID) | SUBCUTANEOUS | Status: DC

## 2022-04-22 NOTE — Progress Notes (Signed)
[facility] [place],  [address]  Internal MEDICINE  Office Visit Note  Patient Name: Jason Francis  [id number]  [id number]  Date of Service: 04/22/2022  Chief Complaint  Patient presents with   Follow-up   Diabetes   Hypertension   Quality Metric Gaps    Eye Exam    HPI [name] presents for a follow up visit for diabetes and hypertension  Switched back to original insulin working better, sugars improved. Back on amlodipine per nephrologist and taking benazepril  Requests orthopedic surgeon since he needs to have his left foot amputated due to charcot --pinched a nerve or strained a muscle in his neck, asking for medication to relieve the pain  Current Medication: Outpatient Encounter Medications as of 04/22/2022  Medication Sig   insulin isophane & regular human KwikPen (HUMULIN 70/30 MIX) (70-30) 100 UNIT/ML KwikPen Inject into the skin. (Patient not taking: Reported on 06/18/2022)   Insulin Pen Needle (PEN NEEDLES 31GX5/16") 31G X 8 MM MISC 1 each by Does not apply route as directed.   metoCLOPramide (REGLAN) 10 MG tablet Take 1 tablet (10 mg total) by mouth 2 (two) times daily as needed for nausea or vomiting.   NOVOLIN N 100 UNIT/ML injection Inject 0.3 mLs (30 Units total) into the skin 2 (two) times daily before a meal.   pantoprazole (PROTONIX) 40 MG tablet Take 1 tablet (40 mg total) by mouth daily.   [DISCONTINUED] benazepril (LOTENSIN) 10 MG tablet Take 1 tablet (10 mg total) by mouth daily.   [DISCONTINUED] cyclobenzaprine (FLEXERIL) 10 MG tablet Take 1 tablet (10 mg total) by mouth 2 (two) times daily as needed for muscle spasms.   [DISCONTINUED] insulin glargine, 2 Unit Dial, (TOUJEO MAX) 300 UNIT/ML Solostar Pen Inject 30 Units into the skin in the morning and at bedtime.   [DISCONTINUED] insulin isophane & regular human KwikPen (HUMULIN 70/30 MIX) (70-30) 100 UNIT/ML KwikPen Inject 18 Units into the skin 2 (two) times daily. Before  breakfast and dinner   [DISCONTINUED] insulin lispro (HUMALOG) 200 UNIT/ML KwikPen Inject 3-5 Units into the skin 3 (three) times daily with meals. 140-160 give 3 units; 161-199 given 4 units, 200 or greater give 5 units. Max 24 hour dose: 20 units.   cyclobenzaprine (FLEXERIL) 10 MG tablet Take 1 tablet (10 mg total) by mouth 2 (two) times daily as needed for muscle spasms.   insulin regular (NOVOLIN R) 100 units/mL injection Inject 0.04-0.05 mLs (4-5 Units total) into the skin 3 (three) times daily before meals.   No facility-administered encounter medications on file as of 04/22/2022.    Surgical History: Past Surgical History:  Procedure Laterality Date   APPENDECTOMY     FOOT SURGERY Left 2019    Medical History: Past Medical History:  Diagnosis Date   Chronic kidney disease    Diabetes mellitus without complication ([place])    Hypertension     Family History: Family History  Problem Relation Age of Onset   Heart disease Mother    Hypertension Mother    Obesity Mother    Heart disease Maternal Grandfather     Social History   Socioeconomic History   Marital status: Single    Spouse name: Not on file   Number of children: Not on file   Years of education: Not on file   Highest education level: Not on file  Occupational History   Not on file  Tobacco Use   Smoking status: Every Day    Packs/day:  2.00    Types: Cigarettes   Smokeless tobacco: Never  Substance and Sexual Activity   Alcohol use: Not Currently   Drug use: Yes    Types: Marijuana, Cocaine    Comment: Occassional cocain use, marijuana daily   Sexual activity: Yes    Partners: Female  Other Topics Concern   Not on file  Social History Narrative   Not on file   Social Determinants of Health   Financial Resource Strain: Not on file  Food Insecurity: Not on file  Transportation Needs: Not on file  Physical Activity: Not on file  Stress: Not on file  Social Connections: Not on file  Intimate  Partner Violence: Not on file      Review of Systems  Constitutional:  Negative for chills, fatigue and unexpected weight change.  HENT:  Positive for postnasal drip. Negative for congestion, rhinorrhea, sneezing and sore throat.   Eyes:  Negative for redness.  Respiratory: Negative.  Negative for cough, chest tightness, shortness of breath and wheezing.   Cardiovascular: Negative.  Negative for chest pain and palpitations.  Gastrointestinal:  Negative for abdominal pain, constipation, diarrhea, nausea and vomiting.  Genitourinary:  Negative for dysuria and frequency.  Musculoskeletal:  Positive for arthralgias (Left neck pain), gait problem (Due to Charcot's joint of the left ankle, ambulates with crutches) and neck pain (Left side). Negative for back pain and joint swelling.  Skin:  Negative for rash.  Neurological:  Negative for tremors and numbness.  Hematological:  Negative for adenopathy. Does not bruise/bleed easily.  Psychiatric/Behavioral:  Negative for behavioral problems (Depression), sleep disturbance and suicidal ideas. The patient is not nervous/anxious.     Vital Signs: BP (!) 160/100 Comment: 172/99  Pulse 93   Temp 98.2 F (36.8 C)   Resp 16   Ht 5\' 8"  (1.727 m)   Wt 180 lb (81.6 kg)   SpO2 97%   BMI 27.37 kg/m    Physical Exam Vitals reviewed.  Constitutional:      General: He is not in acute distress.    Appearance: Normal appearance. He is not ill-appearing.  HENT:     Head: Normocephalic and atraumatic.  Eyes:     Pupils: Pupils are equal, round, and reactive to light.  Cardiovascular:     Rate and Rhythm: Normal rate and regular rhythm.  Pulmonary:     Effort: Pulmonary effort is normal. No respiratory distress.  Neurological:     Mental Status: He is alert and oriented to person, place, and time.     Gait: Gait abnormal (Due to Charcot's joint of the left ankle).  Psychiatric:        Mood and Affect: Mood normal.        Behavior: Behavior  normal.        Assessment/Plan: 1. Type 1 diabetes mellitus with stage 4 chronic kidney disease ([place]) Doing better with sugars on original insulins, nephrologist restarted his amlodipine due to BP, which is still high, will give that and the benazepril time to work. - insulin isophane & regular human KwikPen (HUMULIN 70/30 MIX) (70-30) 100 UNIT/ML KwikPen; Inject into the skin. (Patient not taking: Reported on 06/18/2022) - insulin regular (NOVOLIN R) 100 units/mL injection; Inject 0.04-0.05 mLs (4-5 Units total) into the skin 3 (three) times daily before meals.  Dispense: 10 mL; Refill: 6 - NOVOLIN N 100 UNIT/ML injection; Inject 0.3 mLs (30 Units total) into the skin 2 (two) times daily before a meal.  2. Chronic multifocal osteomyelitis  of left ankle [facility]) Referred to orthopedic surgery  - Ambulatory referral to Orthopedic Surgery  3. Charcot's joint of left ankle Referred to orthopedic surgery  - Ambulatory referral to Orthopedic Surgery  4. Strain of neck muscle, initial encounter Muscle relaxant prescribed to relieve musculoskeletal pain and help him sleep at night - cyclobenzaprine (FLEXERIL) 10 MG tablet; Take 1 tablet (10 mg total) by mouth 2 (two) times daily as needed for muscle spasms.  Dispense: 60 tablet; Refill: 3   General Counseling: emillio ngo understanding of the findings of todays visit and agrees with plan of treatment. I have discussed any further diagnostic evaluation that may be needed or ordered today. We also reviewed his medications today. he has been encouraged to call the office with any questions or concerns that should arise related to todays visit.    Orders Placed This Encounter  Procedures   Ambulatory referral to Orthopedic Surgery    Meds ordered this encounter  Medications   insulin regular (NOVOLIN R) 100 units/mL injection    Sig: Inject 0.04-0.05 mLs (4-5 Units total) into the skin 3 (three) times daily before meals.    Dispense:   10 mL    Refill:  6    Discontinue previous order for insulin lispro   NOVOLIN N 100 UNIT/ML injection    Sig: Inject 0.3 mLs (30 Units total) into the skin 2 (two) times daily before a meal.    Takes OTC from [facility]   cyclobenzaprine (FLEXERIL) 10 MG tablet    Sig: Take 1 tablet (10 mg total) by mouth 2 (two) times daily as needed for muscle spasms.    Dispense:  60 tablet    Refill:  3    Reorder, do not fill, patient will call pharmacy when he needs to refill this medication    Return in about 1 month (around 05/23/2022) for F/U, BP check, Marquasha Brutus PCP.   Total time spent:30 Minutes Time spent includes review of chart, medications, test results, and follow up plan with the patient.   [place] Controlled Substance Database was reviewed by me.  This patient was seen by Jonetta Osgood, FNP-C in collaboration with Dr. Clayborn Bigness as a part of collaborative care agreement.   Medea Deines R. Valetta Fuller, MSN, FNP-C Internal medicine

## 2022-04-23 ENCOUNTER — Telehealth: Payer: Self-pay

## 2022-04-23 NOTE — Telephone Encounter (Signed)
Awaiting 04/22/22 office notes for Orthopedic Surgery referral[facility]

## 2022-04-28 ENCOUNTER — Telehealth: Payer: Self-pay

## 2022-04-28 NOTE — Telephone Encounter (Signed)
Orthopedic referral sent via [facility] to [facility] 

## 2022-05-18 ENCOUNTER — Telehealth: Payer: Self-pay | Admitting: Nurse Practitioner

## 2022-05-18 NOTE — Telephone Encounter (Signed)
Podiatry appointment 05/11/22 @ [facility]

## 2022-05-24 ENCOUNTER — Ambulatory Visit (INDEPENDENT_AMBULATORY_CARE_PROVIDER_SITE_OTHER): Payer: Commercial Managed Care - HMO | Admitting: Nurse Practitioner

## 2022-05-24 ENCOUNTER — Encounter: Payer: Self-pay | Admitting: Nurse Practitioner

## 2022-05-24 VITALS — BP 140/85 | HR 94 | Temp 98.3°F | Resp 16 | Ht 68.0 in | Wt 184.0 lb

## 2022-05-24 DIAGNOSIS — E1022 Type 1 diabetes mellitus with diabetic chronic kidney disease: Secondary | ICD-10-CM

## 2022-05-24 DIAGNOSIS — E1159 Type 2 diabetes mellitus with other circulatory complications: Secondary | ICD-10-CM | POA: Diagnosis not present

## 2022-05-24 DIAGNOSIS — I152 Hypertension secondary to endocrine disorders: Secondary | ICD-10-CM

## 2022-05-24 DIAGNOSIS — M14672 Charcot's joint, left ankle and foot: Secondary | ICD-10-CM | POA: Diagnosis not present

## 2022-05-24 DIAGNOSIS — N184 Chronic kidney disease, stage 4 (severe): Secondary | ICD-10-CM

## 2022-05-24 MED ORDER — BENAZEPRIL HCL 10 MG PO TABS: 10.0000 mg | ORAL_TABLET | Freq: Every day | ORAL | 1 refills | Status: DC

## 2022-05-24 NOTE — Progress Notes (Signed)
[facility] [place],  [address]  Internal MEDICINE  Office Visit Note  Patient Name: Jason Francis  [id number]  [id number]  Date of Service: 05/24/2022  Chief Complaint  Patient presents with   Follow-up   Diabetes   Hypertension   Quality Metric Gaps    Eye exam    HPI Chanler presents for a follow up visit for diabetes, hypertension and CKD.  Hypertension -- BP improved on amlodipine 5 mg and benazepril 10 mg Glucose remains controlled now that he is back on his original insulins.  CKD -- followed by nephro Right charcot foot -- will be seeing Dr. Lucky Cowboy with [facility] soon to discuss amputation.  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   insulin isophane & regular human KwikPen (HUMULIN 70/30 MIX) (70-30) 100 UNIT/ML KwikPen Inject into the skin.   Insulin Pen Needle (PEN NEEDLES 31GX5/16") 31G X 8 MM MISC 1 each by Does not apply route as directed.   insulin regular (NOVOLIN R) 100 units/mL injection Inject 0.04-0.05 mLs (4-5 Units total) into the skin 3 (three) times daily before meals.   metoCLOPramide (REGLAN) 10 MG tablet Take 1 tablet (10 mg total) by mouth 2 (two) times daily as needed for nausea or vomiting.   NOVOLIN N 100 UNIT/ML injection Inject 0.3 mLs (30 Units total) into the skin 2 (two) times daily before a meal.   pantoprazole (PROTONIX) 40 MG tablet Take 1 tablet (40 mg total) by mouth daily.   [DISCONTINUED] benazepril (LOTENSIN) 10 MG tablet Take 1 tablet (10 mg total) by mouth daily.   amLODipine (NORVASC) 5 MG tablet Take 5 mg by mouth daily.   benazepril (LOTENSIN) 10 MG tablet Take 1 tablet (10 mg total) by mouth daily.   calcitRIOL (ROCALTROL) 0.25 MCG capsule Take 0.25 mcg by mouth daily.    methadone (DOLOPHINE) 1 MG/1ML solution Take by mouth.   No facility-administered encounter medications on file as of 05/24/2022.    Surgical History: Past Surgical History:  Procedure Laterality Date   APPENDECTOMY     FOOT SURGERY Left 2019    Medical History: Past Medical History:  Diagnosis Date   Chronic kidney disease    Diabetes mellitus without complication ([place])    Hypertension     Family History: Family History  Problem Relation Age of Onset   Heart disease Mother    Hypertension Mother    Obesity Mother    Heart disease Maternal Grandfather     Social History   Socioeconomic History   Marital status: Single    Spouse name: Not on file   Number of children: Not on file   Years of education: Not on file   Highest education level: Not on file  Occupational History   Not on file  Tobacco Use   Smoking status: Every Day    Packs/day: 2.00    Types: Cigarettes   Smokeless tobacco: Never  Substance and Sexual Activity   Alcohol use: Not Currently   Drug use: Yes    Types: Marijuana, Cocaine    Comment: Occassional cocain use, marijuana daily   Sexual activity: Yes    Partners: Female  Other Topics Concern   Not on file  Social History Narrative  Not on file   Social Determinants of Health   Financial Resource Strain: Not on file  Food Insecurity: Not on file  Transportation Needs: Not on file  Physical Activity: Not on file  Stress: Not on file  Social Connections: Not on file  Intimate Partner Violence: Not on file      Review of Systems  Constitutional:  Negative for chills, fatigue and unexpected weight change.  HENT:  Positive for postnasal drip. Negative for congestion, rhinorrhea, sneezing and sore throat.   Eyes:  Negative for redness.  Respiratory: Negative.  Negative for cough, chest tightness, shortness of breath and wheezing.   Cardiovascular: Negative.  Negative for chest pain and palpitations.  Gastrointestinal:  Negative  for abdominal pain, constipation, diarrhea, nausea and vomiting.  Genitourinary:  Negative for dysuria and frequency.  Musculoskeletal:  Positive for arthralgias (Left neck pain), gait problem (Due to Charcot's joint of the left ankle, ambulates with crutches) and neck pain (Left side). Negative for back pain and joint swelling.  Skin:  Negative for rash.  Neurological:  Negative for tremors and numbness.  Hematological:  Negative for adenopathy. Does not bruise/bleed easily.  Psychiatric/Behavioral:  Negative for behavioral problems (Depression), sleep disturbance and suicidal ideas. The patient is not nervous/anxious.     Vital Signs: BP (!) 140/85   Pulse 94   Temp 98.3 F (36.8 C)   Resp 16   Ht 5\' 8"  (1.727 m)   Wt 184 lb (83.5 kg)   SpO2 95%   BMI 27.98 kg/m    Physical Exam Vitals reviewed.  Constitutional:      General: He is not in acute distress.    Appearance: Normal appearance. He is not ill-appearing.  HENT:     Head: Normocephalic and atraumatic.  Eyes:     Pupils: Pupils are equal, round, and reactive to light.  Cardiovascular:     Rate and Rhythm: Normal rate and regular rhythm.  Pulmonary:     Effort: Pulmonary effort is normal. No respiratory distress.  Neurological:     Mental Status: He is alert and oriented to person, place, and time.     Gait: Gait abnormal (Due to Charcot's joint of the left ankle).  Psychiatric:        Mood and Affect: Mood normal.        Behavior: Behavior normal.        Assessment/Plan: 1. Hypertension associated with diabetes ([place]) Improving, continue benazepril and amlodipine as prescribed.  - benazepril (LOTENSIN) 10 MG tablet; Take 1 tablet (10 mg total) by mouth daily.  Dispense: 90 tablet; Refill: 1  2. 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   3. Charcot's joint of left ankle Upcoming appt with Dr. Lucky Cowboy, will be schedule surgery for amputation  of the left foot and ankle.    General Counseling: isao seltzer understanding of the findings of todays visit and agrees with plan of treatment. I have discussed any further diagnostic evaluation that may be needed or ordered today. We also reviewed his medications today. he has been encouraged to call the office with any questions or concerns that should arise related to todays visit.    No orders of the defined types were placed in this encounter.   Meds ordered this encounter  Medications   benazepril (LOTENSIN) 10 MG tablet    Sig: Take 1 tablet (10 mg total) by mouth daily.    Dispense:  90 tablet  Refill:  1    Pt req 90 day supply, takes this med along with amlodipine.    Return for CPE, [place] PCP in december .   Total time spent:30 Minutes Time spent includes review of chart, medications, test results, and follow up plan with the patient.   [place] Controlled Substance Database was reviewed by me.  This patient was seen by Jonetta Osgood, FNP-C in collaboration with Dr. Clayborn Bigness as a part of collaborative care agreement.   Alister Staver R. Valetta Fuller, MSN, FNP-C Internal medicine

## 2022-05-30 ENCOUNTER — Encounter: Payer: Self-pay | Admitting: Nurse Practitioner

## 2022-06-03 ENCOUNTER — Other Ambulatory Visit: Payer: Self-pay | Admitting: Nurse Practitioner

## 2022-06-18 ENCOUNTER — Ambulatory Visit (INDEPENDENT_AMBULATORY_CARE_PROVIDER_SITE_OTHER): Payer: Commercial Managed Care - HMO | Admitting: Vascular Surgery

## 2022-06-18 ENCOUNTER — Encounter (INDEPENDENT_AMBULATORY_CARE_PROVIDER_SITE_OTHER): Payer: Self-pay | Admitting: Vascular Surgery

## 2022-06-18 VITALS — BP 157/102 | HR 87 | Resp 16

## 2022-06-18 DIAGNOSIS — I10 Essential (primary) hypertension: Secondary | ICD-10-CM | POA: Insufficient documentation

## 2022-06-18 DIAGNOSIS — N184 Chronic kidney disease, stage 4 (severe): Secondary | ICD-10-CM

## 2022-06-18 DIAGNOSIS — E10618 Type 1 diabetes mellitus with other diabetic arthropathy: Secondary | ICD-10-CM | POA: Diagnosis not present

## 2022-06-18 DIAGNOSIS — M86372 Chronic multifocal osteomyelitis, left ankle and foot: Secondary | ICD-10-CM

## 2022-06-18 NOTE — H&P (View-Only) (Signed)
Patient ID: Jason Francis, male   DOB: 1991-03-12, 31 y.o.   MRN: [id number]  Chief Complaint  Patient presents with   New Patient (Initial Visit)    Ref Vickki Muff consult second opinion for left BKA    HPI Jason Francis is a 31 y.o. male.  I am asked to see the patient by Dr. Vickki Muff for evaluation for left BKA.  The patient has a severe Charcot deformity of his left foot and has not been able to walk on this for many months.  He has been told by multiple providers that the foot is not salvageable and that they would recommend a below-knee amputation.  He has now come to terms with this after a few months and is referred for evaluation for below-knee amputation.  He does still have sensation in the foot and lower leg.  He has longstanding diabetes which has not been particularly well controlled and stage IV chronic kidney disease his medical comorbidities.  He denies any fever or chills.  No signs of systemic infection.  No right foot or leg issues at this point.  Did not have claudication or rest pain symptoms antecedent to these issues..     Past Medical History:  Diagnosis Date   Chronic kidney disease    Diabetes mellitus without complication (HCC)    Hypertension     Past Surgical History:  Procedure Laterality Date   APPENDECTOMY     FOOT SURGERY Left 2019     Family History  Problem Relation Age of Onset   Heart disease Mother    Hypertension Mother    Obesity Mother    Heart disease Maternal Grandfather       Social History   Tobacco Use   Smoking status: Every Day    Packs/day: 2.00    Types: Cigarettes   Smokeless tobacco: Never  Substance Use Topics   Alcohol use: Not Currently   Drug use: Yes    Types: Marijuana, Cocaine    Comment: Occassional cocain use, marijuana daily     Allergies  Allergen Reactions   Augmentin [Amoxicillin-Pot Clavulanate] Nausea And Vomiting   Sulfa Antibiotics Nausea And Vomiting    Current Outpatient Medications   Medication Sig Dispense Refill   amLODipine (NORVASC) 5 MG tablet Take 5 mg by mouth daily.     benazepril (LOTENSIN) 10 MG tablet Take 1 tablet (10 mg total) by mouth daily. 90 tablet 1   calcitRIOL (ROCALTROL) 0.25 MCG capsule Take 0.25 mcg by mouth daily.     cyclobenzaprine (FLEXERIL) 10 MG tablet Take 1 tablet (10 mg total) by mouth 2 (two) times daily as needed for muscle spasms. 60 tablet 3   Insulin Pen Needle (PEN NEEDLES 31GX5/16") 31G X 8 MM MISC 1 each by Does not apply route as directed. 100 each 1   insulin regular (NOVOLIN R) 100 units/mL injection Inject 0.04-0.05 mLs (4-5 Units total) into the skin 3 (three) times daily before meals. 10 mL 6   methadone (DOLOPHINE) 1 MG/1ML solution Take by mouth.     metoCLOPramide (REGLAN) 10 MG tablet Take 1 tablet (10 mg total) by mouth 2 (two) times daily as needed for nausea or vomiting. 180 tablet 1   NOVOLIN N 100 UNIT/ML injection Inject 0.3 mLs (30 Units total) into the skin 2 (two) times daily before a meal.     pantoprazole (PROTONIX) 40 MG tablet Take 1 tablet (40 mg total) by mouth daily. 90 tablet 1   insulin  isophane & regular human KwikPen (HUMULIN 70/30 MIX) (70-30) 100 UNIT/ML KwikPen Inject into the skin. (Patient not taking: Reported on 06/18/2022)     No current facility-administered medications for this visit.      REVIEW OF SYSTEMS (Negative unless checked)  Constitutional: [] Weight loss  [] Fever  [] Chills Cardiac: [] Chest pain   [] Chest pressure   [] Palpitations   [] Shortness of breath when laying flat   [] Shortness of breath at rest   [] Shortness of breath with exertion. Vascular:  [] Pain in legs with walking   [] Pain in legs at rest   [] Pain in legs when laying flat   [] Claudication   [] Pain in feet when walking  [] Pain in feet at rest  [] Pain in feet when laying flat   [] History of DVT   [] Phlebitis   [] Swelling in legs   [] Varicose veins   [] Non-healing ulcers Pulmonary:   [] Uses home oxygen   [] Productive cough    [] Hemoptysis   [] Wheeze  [] COPD   [] Asthma Neurologic:  [] Dizziness  [] Blackouts   [] Seizures   [] History of stroke   [] History of TIA  [] Aphasia   [] Temporary blindness   [] Dysphagia   [] Weakness or numbness in arms   [] Weakness or numbness in legs Musculoskeletal:  [] Arthritis   [] Joint swelling   [x] Joint pain   [] Low back pain Hematologic:  [] Easy bruising  [] Easy bleeding   [] Hypercoagulable state   [] Anemic  [] Hepatitis Gastrointestinal:  [] Blood in stool   [] Vomiting blood  [] Gastroesophageal reflux/heartburn   [] Abdominal pain Genitourinary:  [x] Chronic kidney disease   [] Difficult urination  [] Frequent urination  [] Burning with urination   [] Hematuria Skin:  [] Rashes   [x] Ulcers   [x] Wounds Psychological:  [] History of anxiety   []  History of major depression.    Physical Exam BP (!) 157/102 (BP Location: Left Arm)   Pulse 87   Resp 16  Gen:  WD/WN, NAD.  Appears older than stated age Head: [place]/AT, No temporalis wasting.  Ear/Nose/Throat: Hearing grossly intact, nares w/o erythema or drainage, oropharynx w/o Erythema/Exudate Eyes: Conjunctiva clear, sclera non-icteric  Neck: trachea midline.  No JVD.  Pulmonary:  Good air movement, respirations not labored, no use of accessory muscles  Cardiac: RRR, no JVD Vascular:  Vessel Right Left  Radial Palpable Palpable                                   Gastrointestinal:. No masses, surgical incisions, or scars. Musculoskeletal: M/S 5/5 throughout.  Extremities without ischemic changes.  Severe Charcot deformity of the left foot and ankle without erythema or drainage Neurologic: Sensation grossly intact in extremities.  Symmetrical.  Speech is fluent. Motor exam as listed above. Psychiatric: Judgment intact, Mood & affect appropriate for pt's clinical situation. Dermatologic: No rashes or ulcers noted.  No cellulitis or open wounds.    Radiology No results found.  Labs No results found for this or any previous visit (from  the past 2160 hour(s)).  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  At this point, below-knee amputation would be the most reasonable and prudent option to proceed with.  He is much more likely to walk with a prosthesis in a few months than ever have a functional foot and ankle again.  He has now come to terms with this.  He is agreeable to proceed with left below-knee amputation  in the near future.  I discussed the risks and benefits of the procedure.  I discussed the expected hospital course and recovery timeframe.  He and his family voiced their understanding and are agreeable to proceed.  Insulin dependent type 1 diabetes mellitus (HCC) blood glucose control important in reducing the progression of atherosclerotic disease. Also, involved in wound healing. On appropriate medications.   CKD (chronic kidney disease) stage 4, GFR 15-29 ml/min (HCC) May limit or impair wound healing.  Essential hypertension, benign blood pressure control important in reducing the progression of atherosclerotic disease. On appropriate oral medications.      Leotis Pain 06/18/2022, 3:58 PM   This note was created with Dragon medical transcription system.  Any errors from dictation are unintentional.

## 2022-06-18 NOTE — Assessment & Plan Note (Signed)
blood glucose control important in reducing the progression of atherosclerotic disease. Also, involved in wound healing. On appropriate medications.  

## 2022-06-18 NOTE — Assessment & Plan Note (Signed)
May limit or impair wound healing.

## 2022-06-18 NOTE — Assessment & Plan Note (Signed)
blood pressure control important in reducing the progression of atherosclerotic disease. On appropriate oral medications.  

## 2022-06-18 NOTE — Patient Instructions (Signed)
Leg Amputation        Leg amputation is a procedure to remove the leg. You may have this procedure if your leg has been affected by a disease, such as peripheral vascular disease or severe circulation disease. You may also need this procedure if you have tumors or a traumatic injury and the leg is causing problems. There are three types of leg amputation: Above-the-knee amputation (transfemoral amputation). This type is done to remove the leg from above the knee. Below-the-knee amputation (transtibial amputation). This type is done to remove the leg from below the knee. Through-knee amputation. This type is done to remove the leg at the knee joint. Tell a health care provider about: Any allergies you have. All medicines you are taking, including vitamins, herbs, eye drops, creams, and over-the-counter medicines. Any problems you or family members have had with anesthetic medicines. Any bleeding problems you have. Any surgeries you have had. Any medical conditions you have. Whether you are pregnant or may be pregnant. What are the risks? Generally, this is a safe procedure. However, problems may occur, including: Infection. Bleeding. Allergic reactions to medicines. Stiffening of the hip and knee joint (hip and knee contracture). Blood clot in a deep vein (deep vein thrombosis, or DVT), usually in the leg. A blood clot in your lung (pulmonary embolism). What happens before the procedure? Staying hydrated Follow instructions from your health care provider about hydration, which may include: Up to 2 hours before the procedure - you may continue to drink clear liquids, such as water, clear fruit juice, black coffee, and plain tea. Eating and drinking restrictions Follow instructions from your health care provider about eating and drinking, which may include: 8 hours before the procedure - stop eating heavy meals or foods, such as meat, fried foods, or fatty foods. 6 hours before the  procedure - stop eating light meals or foods, such as toast or cereal. 6 hours before the procedure - stop drinking milk or drinks that contain milk. 2 hours before the procedure - stop drinking clear liquids. Medicines Ask your health care provider about: Changing or stopping your regular medicines. This is especially important if you are taking diabetes medicines or blood thinners. Taking medicines such as aspirin and ibuprofen. These medicines can thin your blood. Do not take these medicines unless your health care provider tells you to take them. Taking over-the-counter medicines, vitamins, herbs, and supplements. General instructions Ask your health care provider: How your surgery site will be marked. What steps will be taken to help prevent infection. These steps may include: Removing hair at the surgery site. Washing skin with a germ-killing soap. Taking antibiotic medicine. Plan to have a responsible adult take you home from the hospital or clinic. Plan to have a responsible adult care for you for the time you are told after you leave the hospital or clinic. This is important. What happens during the procedure? An IV will be inserted into one of your veins. You will be given one or more of the following: A medicine to help you relax (sedative). A medicine to numb the area (local anesthetic). A medicine to make you fall asleep (general anesthetic). A medicine that is injected into your spine to numb the area below and slightly above the injection site (spinal anesthetic). A medicine that is injected into an area of your body to numb everything below the injection site (regional anesthetic). Damaged or diseased tissue and bone will be removed. Uneven areas of bone will be smoothed.  Blood vessels and nerves will be closed off. Muscles will be cut and reshaped so that an artificial leg (prosthesis) can be attached to the stump. The incision will be closed with stitches (sutures),  staples, or glue. A bandage (dressing) will be placed over the incision. The procedure may vary among health care providers and hospitals. What happens after the procedure? Your blood pressure, heart rate, breathing rate, and blood oxygen level will be monitored until you leave the hospital or clinic. You will be given medicine for pain. The medicine may include: A sedative. A spinal anesthetic. A nerve block. This is an injection of numbing medicine near the nerve. Muscle relaxants. These may relieve pain caused by muscle spasms. Opioids. These medicines relieve pain and are a type of narcotic pain medicine. Non-opioid medicines. These may include acetaminophen and NSAIDs, such as ibuprofen. You may start physical therapy within 24 hours after your surgery. Summary Leg amputation is a procedure to remove the leg from above, below, or through the knee. Follow instructions from your health care provider about eating or drinking restrictions before the procedure. Before the procedure, ask your health care provider how your surgery site will be marked. This information is not intended to replace advice given to you by your health care provider. Make sure you discuss any questions you have with your health care provider. Document Revised: 12/18/2020 Document Reviewed: 12/18/2020 Elsevier Patient Education  Jason Francis.

## 2022-06-18 NOTE — Progress Notes (Signed)
Patient ID: Jason Francis, male   DOB: 1990/10/06, 31 y.o.   MRN: [id number]  Chief Complaint  Patient presents with   New Patient (Initial Visit)    Ref Vickki Muff consult second opinion for left BKA    HPI Jason Francis is a 31 y.o. male.  I am asked to see the patient by Dr. Vickki Muff for evaluation for left BKA.  The patient has a severe Charcot deformity of his left foot and has not been able to walk on this for many months.  He has been told by multiple providers that the foot is not salvageable and that they would recommend a below-knee amputation.  He has now come to terms with this after a few months and is referred for evaluation for below-knee amputation.  He does still have sensation in the foot and lower leg.  He has longstanding diabetes which has not been particularly well controlled and stage IV chronic kidney disease his medical comorbidities.  He denies any fever or chills.  No signs of systemic infection.  No right foot or leg issues at this point.  Did not have claudication or rest pain symptoms antecedent to these issues..     Past Medical History:  Diagnosis Date   Chronic kidney disease    Diabetes mellitus without complication (HCC)    Hypertension     Past Surgical History:  Procedure Laterality Date   APPENDECTOMY     FOOT SURGERY Left 2019     Family History  Problem Relation Age of Onset   Heart disease Mother    Hypertension Mother    Obesity Mother    Heart disease Maternal Grandfather       Social History   Tobacco Use   Smoking status: Every Day    Packs/day: 2.00    Types: Cigarettes   Smokeless tobacco: Never  Substance Use Topics   Alcohol use: Not Currently   Drug use: Yes    Types: Marijuana, Cocaine    Comment: Occassional cocain use, marijuana daily     Allergies  Allergen Reactions   Augmentin [Amoxicillin-Pot Clavulanate] Nausea And Vomiting   Sulfa Antibiotics Nausea And Vomiting    Current Outpatient Medications   Medication Sig Dispense Refill   amLODipine (NORVASC) 5 MG tablet Take 5 mg by mouth daily.     benazepril (LOTENSIN) 10 MG tablet Take 1 tablet (10 mg total) by mouth daily. 90 tablet 1   calcitRIOL (ROCALTROL) 0.25 MCG capsule Take 0.25 mcg by mouth daily.     cyclobenzaprine (FLEXERIL) 10 MG tablet Take 1 tablet (10 mg total) by mouth 2 (two) times daily as needed for muscle spasms. 60 tablet 3   Insulin Pen Needle (PEN NEEDLES 31GX5/16") 31G X 8 MM MISC 1 each by Does not apply route as directed. 100 each 1   insulin regular (NOVOLIN R) 100 units/mL injection Inject 0.04-0.05 mLs (4-5 Units total) into the skin 3 (three) times daily before meals. 10 mL 6   methadone (DOLOPHINE) 1 MG/1ML solution Take by mouth.     metoCLOPramide (REGLAN) 10 MG tablet Take 1 tablet (10 mg total) by mouth 2 (two) times daily as needed for nausea or vomiting. 180 tablet 1   NOVOLIN N 100 UNIT/ML injection Inject 0.3 mLs (30 Units total) into the skin 2 (two) times daily before a meal.     pantoprazole (PROTONIX) 40 MG tablet Take 1 tablet (40 mg total) by mouth daily. 90 tablet 1   insulin  isophane & regular human KwikPen (HUMULIN 70/30 MIX) (70-30) 100 UNIT/ML KwikPen Inject into the skin. (Patient not taking: Reported on 06/18/2022)     No current facility-administered medications for this visit.      REVIEW OF SYSTEMS (Negative unless checked)  Constitutional: [] Weight loss  [] Fever  [] Chills Cardiac: [] Chest pain   [] Chest pressure   [] Palpitations   [] Shortness of breath when laying flat   [] Shortness of breath at rest   [] Shortness of breath with exertion. Vascular:  [] Pain in legs with walking   [] Pain in legs at rest   [] Pain in legs when laying flat   [] Claudication   [] Pain in feet when walking  [] Pain in feet at rest  [] Pain in feet when laying flat   [] History of DVT   [] Phlebitis   [] Swelling in legs   [] Varicose veins   [] Non-healing ulcers Pulmonary:   [] Uses home oxygen   [] Productive cough    [] Hemoptysis   [] Wheeze  [] COPD   [] Asthma Neurologic:  [] Dizziness  [] Blackouts   [] Seizures   [] History of stroke   [] History of TIA  [] Aphasia   [] Temporary blindness   [] Dysphagia   [] Weakness or numbness in arms   [] Weakness or numbness in legs Musculoskeletal:  [] Arthritis   [] Joint swelling   [x] Joint pain   [] Low back pain Hematologic:  [] Easy bruising  [] Easy bleeding   [] Hypercoagulable state   [] Anemic  [] Hepatitis Gastrointestinal:  [] Blood in stool   [] Vomiting blood  [] Gastroesophageal reflux/heartburn   [] Abdominal pain Genitourinary:  [x] Chronic kidney disease   [] Difficult urination  [] Frequent urination  [] Burning with urination   [] Hematuria Skin:  [] Rashes   [x] Ulcers   [x] Wounds Psychological:  [] History of anxiety   []  History of major depression.    Physical Exam BP (!) 157/102 (BP Location: Left Arm)   Pulse 87   Resp 16  Gen:  WD/WN, NAD.  Appears older than stated age Head: [place]/AT, No temporalis wasting.  Ear/Nose/Throat: Hearing grossly intact, nares w/o erythema or drainage, oropharynx w/o Erythema/Exudate Eyes: Conjunctiva clear, sclera non-icteric  Neck: trachea midline.  No JVD.  Pulmonary:  Good air movement, respirations not labored, no use of accessory muscles  Cardiac: RRR, no JVD Vascular:  Vessel Right Left  Radial Palpable Palpable                                   Gastrointestinal:. No masses, surgical incisions, or scars. Musculoskeletal: M/S 5/5 throughout.  Extremities without ischemic changes.  Severe Charcot deformity of the left foot and ankle without erythema or drainage Neurologic: Sensation grossly intact in extremities.  Symmetrical.  Speech is fluent. Motor exam as listed above. Psychiatric: Judgment intact, Mood & affect appropriate for pt's clinical situation. Dermatologic: No rashes or ulcers noted.  No cellulitis or open wounds.    Radiology No results found.  Labs No results found for this or any previous visit (from  the past 2160 hour(s)).  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  At this point, below-knee amputation would be the most reasonable and prudent option to proceed with.  He is much more likely to walk with a prosthesis in a few months than ever have a functional foot and ankle again.  He has now come to terms with this.  He is agreeable to proceed with left below-knee amputation  in the near future.  I discussed the risks and benefits of the procedure.  I discussed the expected hospital course and recovery timeframe.  He and his family voiced their understanding and are agreeable to proceed.  Insulin dependent type 1 diabetes mellitus (HCC) blood glucose control important in reducing the progression of atherosclerotic disease. Also, involved in wound healing. On appropriate medications.   CKD (chronic kidney disease) stage 4, GFR 15-29 ml/min (HCC) May limit or impair wound healing.  Essential hypertension, benign blood pressure control important in reducing the progression of atherosclerotic disease. On appropriate oral medications.      Leotis Pain 06/18/2022, 3:58 PM   This note was created with Dragon medical transcription system.  Any errors from dictation are unintentional.

## 2022-06-18 NOTE — Assessment & Plan Note (Signed)
With severe Charcot deformity and a nonsalvageable foot determined by multiple practitioners.  At this point, below-knee amputation would be the most reasonable and prudent option to proceed with.  He is much more likely to walk with a prosthesis in a few months than ever have a functional foot and ankle again.  He has now come to terms with this.  He is agreeable to proceed with left below-knee amputation in the near future.  I discussed the risks and benefits of the procedure.  I discussed the expected hospital course and recovery timeframe.  He and his family voiced their understanding and are agreeable to proceed.

## 2022-06-22 ENCOUNTER — Telehealth (INDEPENDENT_AMBULATORY_CARE_PROVIDER_SITE_OTHER): Payer: Self-pay

## 2022-06-22 NOTE — Telephone Encounter (Signed)
Spoke with the patient and offered 07/01/22 for his surgery based on getting his prior auth from his insurance company. Patient stated he could not do 07/01/22, I offered 07/08/22 and patient stated that would work. At this point I am waiting on prior authorization.

## 2022-06-25 ENCOUNTER — Telehealth (INDEPENDENT_AMBULATORY_CARE_PROVIDER_SITE_OTHER): Payer: Self-pay

## 2022-06-25 NOTE — Telephone Encounter (Signed)
Spoke with the patient and he is scheduled with Dr. Lucky Cowboy on 07/08/22 for a left BKA at [facility]. Pre-op phone call is on 06/30/22 between 1-5 pm. Pre- surgical instructions were discussed and patient stated he will pick the information up in our office.

## 2022-06-26 ENCOUNTER — Encounter: Payer: Self-pay | Admitting: Nurse Practitioner

## 2022-06-30 ENCOUNTER — Encounter
Admission: RE | Admit: 2022-06-30 | Discharge: 2022-06-30 | Disposition: A | Discharge status: Home / Self Care | Payer: Commercial Managed Care - HMO | Source: Ambulatory Visit | Attending: Vascular Surgery | Admitting: Vascular Surgery

## 2022-06-30 ENCOUNTER — Other Ambulatory Visit (INDEPENDENT_AMBULATORY_CARE_PROVIDER_SITE_OTHER): Payer: Self-pay | Admitting: Nurse Practitioner

## 2022-06-30 ENCOUNTER — Other Ambulatory Visit: Payer: Self-pay

## 2022-06-30 VITALS — Ht 68.0 in | Wt 180.0 lb

## 2022-06-30 DIAGNOSIS — E10618 Type 1 diabetes mellitus with other diabetic arthropathy: Secondary | ICD-10-CM

## 2022-06-30 DIAGNOSIS — F1490 Cocaine use, unspecified, uncomplicated: Secondary | ICD-10-CM

## 2022-06-30 DIAGNOSIS — E11610 Type 2 diabetes mellitus with diabetic neuropathic arthropathy: Secondary | ICD-10-CM

## 2022-06-30 HISTORY — DX: Methicillin resistant Staphylococcus aureus infection, unspecified site: A49.02

## 2022-06-30 HISTORY — DX: Gastro-esophageal reflux disease without esophagitis: K21.9

## 2022-06-30 NOTE — Patient Instructions (Signed)
Your procedure is scheduled on: 07/08/22 Report to [facility]. To find out your arrival time please call [phone] between 1PM - 3PM on 07/07/22.  Remember: Instructions that are not followed completely may result in serious medical risk, up to and including death, or upon the discretion of your surgeon and anesthesiologist your surgery may need to be rescheduled.     _X__ 1. Do not eat food or drink any liquids after midnight the night before your procedure.                 No gum chewing or hard candies.   __X__2.  On the morning of surgery brush your teeth with toothpaste and water, you                 may rinse your mouth with mouthwash if you wish.  Do not swallow any              toothpaste of mouthwash.     _X__ 3.  No Alcohol for 24 hours before or after surgery.   _X__ 4.  Do Not Smoke or use e-cigarettes For 24 Hours Prior to Your Surgery.                 Do not use any chewable tobacco products for at least 6 hours prior to                 surgery.  ____  5.  Bring all medications with you on the day of surgery if instructed.   __X__  6.  Notify your doctor if there is any change in your medical condition      (cold, fever, infections).     Do not wear jewelry, make-up, hairpins, clips or nail polish. Do not wear lotions, powders, or perfumes.  Do not shave body hair 48 hours prior to surgery. Men may shave face and neck. Do not bring valuables to the hospital.    [facility] is not responsible for any belongings or valuables.  Contacts, dentures/partials or body piercings may not be worn into surgery. Bring a case for your contacts, glasses or hearing aids, a denture cup will be supplied. Leave your suitcase in the car. After surgery it may be brought to your room. For patients admitted to the hospital, discharge time is determined by your treatment team.   Patients discharged the day of surgery will not be  allowed to drive home.   Please read over the following fact sheets that you were given:   MRSA Information, CHG soap  __X__ Take these medicines the morning of surgery with A SIP OF WATER:    1. carvedilol (COREG) 6.25 MG tablet  2. methadone (DOLOPHINE)   3. pantoprazole (PROTONIX) 40 MG tablet  4.  5.  6.  ____ Fleet Enema (as directed)   __X__ Use CHG Soap/SAGE wipes as directed  ____ Use inhalers on the day of surgery  ____ Stop metformin/Janumet/Farxiga 2 days prior to surgery    __X__ Take 1/2 of usual insulin dose the night before surgery (bedtime dose). No insulin the morning          of surgery. On your surgery day you may call the surgery department for advice at [phone] if you have concerns with elevating blood sugars.  ____ Stop Blood Thinners Coumadin/Plavix/Xarelto/Pleta/Pradaxa/Eliquis/Effient/Aspirin  on   Or contact your Surgeon, Cardiologist or Medical Doctor regarding  ability to stop your  blood thinners  __X__ Stop Anti-inflammatories 7 days before surgery such as Advil, Ibuprofen, Motrin,  BC or Goodies Powder, Naprosyn, Naproxen, Aleve, Aspirin    __X__ Stop all herbals and supplements, fish oil or vitamins for 7 days until after surgery.    ____ Bring C-Pap to the hospital.

## 2022-07-02 ENCOUNTER — Encounter: Payer: Self-pay | Admitting: Urgent Care

## 2022-07-02 ENCOUNTER — Inpatient Hospital Stay: Payer: Commercial Managed Care - HMO | Admitting: Urgent Care

## 2022-07-02 ENCOUNTER — Encounter: Payer: Self-pay | Admitting: Vascular Surgery

## 2022-07-02 ENCOUNTER — Encounter
Admission: RE | Admit: 2022-07-02 | Discharge: 2022-07-02 | Disposition: A | Discharge status: Home / Self Care | Payer: Commercial Managed Care - HMO | Source: Ambulatory Visit | Attending: Vascular Surgery | Admitting: Vascular Surgery

## 2022-07-02 DIAGNOSIS — Z01818 Encounter for other preprocedural examination: Secondary | ICD-10-CM | POA: Diagnosis present

## 2022-07-02 DIAGNOSIS — E11610 Type 2 diabetes mellitus with diabetic neuropathic arthropathy: Secondary | ICD-10-CM

## 2022-07-02 DIAGNOSIS — F1490 Cocaine use, unspecified, uncomplicated: Secondary | ICD-10-CM | POA: Diagnosis not present

## 2022-07-02 HISTORY — DX: Chronic kidney disease, stage 4 (severe): N18.4

## 2022-07-02 LAB — CBC WITH DIFFERENTIAL/PLATELET
Abs Immature Granulocytes: 0.04 10*3/uL (ref 0.00–0.07)
Basophils Absolute: 0.1 10*3/uL (ref 0.0–0.1)
Basophils Relative: 1 %
Eosinophils Absolute: 0.7 10*3/uL — ABNORMAL HIGH (ref 0.0–0.5)
Eosinophils Relative: 6 %
HCT: 32.5 % — ABNORMAL LOW (ref 39.0–52.0)
Hemoglobin: 10.4 g/dL — ABNORMAL LOW (ref 13.0–17.0)
Immature Granulocytes: 0 %
Lymphocytes Relative: 28 %
Lymphs Abs: 3.1 10*3/uL (ref 0.7–4.0)
MCH: 27.6 pg (ref 26.0–34.0)
MCHC: 32 g/dL (ref 30.0–36.0)
MCV: 86.2 fL (ref 80.0–100.0)
Monocytes Absolute: 0.9 10*3/uL (ref 0.1–1.0)
Monocytes Relative: 8 %
Neutro Abs: 6.3 10*3/uL (ref 1.7–7.7)
Neutrophils Relative %: 57 %
Platelets: 335 10*3/uL (ref 150–400)
RBC: 3.77 MIL/uL — ABNORMAL LOW (ref 4.22–5.81)
RDW: 13.7 % (ref 11.5–15.5)
WBC: 11.1 10*3/uL — ABNORMAL HIGH (ref 4.0–10.5)
nRBC: 0 % (ref 0.0–0.2)

## 2022-07-02 LAB — URINE DRUG SCREEN, QUALITATIVE (ARMC ONLY)
Amphetamines, Ur Screen: NOT DETECTED
Barbiturates, Ur Screen: NOT DETECTED
Benzodiazepine, Ur Scrn: NOT DETECTED
Cannabinoid 50 Ng, Ur ~~LOC~~: POSITIVE — AB
Cocaine Metabolite,Ur ~~LOC~~: POSITIVE — AB
MDMA (Ecstasy)Ur Screen: NOT DETECTED
Methadone Scn, Ur: POSITIVE — AB
Opiate, Ur Screen: NOT DETECTED
Phencyclidine (PCP) Ur S: NOT DETECTED
Tricyclic, Ur Screen: POSITIVE — AB

## 2022-07-02 LAB — BASIC METABOLIC PANEL
Anion gap: 8 (ref 5–15)
BUN: 54 mg/dL — ABNORMAL HIGH (ref 6–20)
CO2: 20 mmol/L — ABNORMAL LOW (ref 22–32)
Calcium: 9.2 mg/dL (ref 8.9–10.3)
Chloride: 107 mmol/L (ref 98–111)
Creatinine, Ser: 3.63 mg/dL — ABNORMAL HIGH (ref 0.61–1.24)
GFR, Estimated: 22 mL/min — ABNORMAL LOW (ref >60)
Glucose, Bld: 135 mg/dL — ABNORMAL HIGH (ref 70–99)
Potassium: 5.9 mmol/L — ABNORMAL HIGH (ref 3.5–5.1)
Sodium: 135 mmol/L (ref 135–145)

## 2022-07-02 LAB — TYPE AND SCREEN
ABO/RH(D): O POS
Antibody Screen: NEGATIVE

## 2022-07-02 NOTE — Progress Notes (Addendum)
  [facility] Perioperative Services: Pre-Admission/Anesthesia Testing  Abnormal Lab Notification   Date: 07/02/22  Name: Jason Francis MRN:   [id number]  Re: Abnormal labs noted during PAT appointment   Notified:  Provider Name Provider Role Notification Mode  Dew, Erskine Squibb, MD Vascular Surgery (Surgeon) Routed and/or faxed via Jones Skene, NP-C Vascular Surgery (APP Routed and/or faxed via [place] and Notes:  ABNORMAL LAB VALUE(S):  Tricyclic, Ur Screen 74/16/3845 POSITIVE (A)    Amphetamines, Ur Screen 07/02/2022 NONE DETECTED    MDMA (Ecstasy)Ur Screen 07/02/2022 NONE DETECTED    Cocaine Metabolite,Ur [place] 07/02/2022 POSITIVE (A)    Opiate, Ur Screen 07/02/2022 NONE DETECTED    Phencyclidine (PCP) Ur S 07/02/2022 NONE DETECTED    Cannabinoid 50 Ng, Ur [place] 07/02/2022 POSITIVE (A)    Barbiturates, Ur Screen 07/02/2022 NONE DETECTED    Benzodiazepine, Ur Scrn 07/02/2022 NONE DETECTED    Methadone Scn, Ur 07/02/2022 POSITIVE (A)    Jason Francis is scheduled for a AMPUTATION BELOW KNEE (Left: Knee) on 07/08/2022. Patient with a PMH (+) for polysubstance abuse. Testing (+) for tricyclics, cocaine, THC, methadone. Of note, patient does not have tricyclics on his medication list, however the methadone is prescribed. Preoperative UDS performed in order to determine what substances patient may have potentially ingested prior to upcoming orthopedic/vascular surgery.   Impression and Plan:  Patient was (+) for multiple substances today, both prescribed and illegal. Of the most concern is the cocaine. The main cocaine metabolite tested for in urine, benzoylecgonine, can be detected for up to +/- 10 days (average 7 days) following cocaine use. Cocaine use, in the setting of general anesthesia administration, places the patient at significantly increased risks of potential perioperative complications. Complications can include, but are not limited to,  malignant hypertension, tachyarrhythmias/dysrhythmias, prolonged QTc intervals, myocardial ischemia/vasospasm, convulsions, aortic dissection, CVA, and premature death.   He was previous advised, both verbally and in writing, that he would need to abstain from the use of illegal substances during the perioperative and immediate postoperative periods. Encouraged cessation all together.   Patient is aware that if he is cocaine (+) on the day of his procedure, given the elective nature, his surgery will be postponed pending surgeon discretion and commitment from the patient to refrain from the use of illegal substances as discussed during his appointment with the PAT staff. Patient verbalized understanding of all of the aforementioned.   Results of this testing HAS NOT been discussed with patient, nor are any changes being made to the OR schedule at this point.  Note being sent to primary attending surgeon to make him aware of the results and plans for repeat UDS on the day of surgery; order has been entered. Based on clinical condition of the patient, decision will need to be made as to whether or not this case should be deemed urgent/emergent or if we are safe to postpone pending negative drug screen.   Honor Loh, MSN, APRN, FNP-C, CEN [facility]  Peri-operative Services Nurse Practitioner Phone: [phone] Fax: [phone] 07/02/22 11:10 AM

## 2022-07-02 NOTE — Progress Notes (Signed)
  [facility] Perioperative Services: Pre-Admission/Anesthesia Testing  Abnormal Lab Notification   Date: 07/02/22  Name: Brynn Reznik MRN:   [id number]  Re: Abnormal labs noted during PAT appointment   Notified:  Provider Name Provider Role Notification Mode  Dew, Erskine Squibb, MD Vascular Surgery (Surgeon) Routed and/or faxed via Jones Skene, NP-C Vascular Surgery (APP) Routed and/or faxed via [place] and Notes:  ABNORMAL LAB VALUE(S):  Lab Results  Component Value Date   K 5.9 (H) 07/02/2022   [name] is scheduled for a AMPUTATION BELOW KNEE (Left: Knee) on 07/08/2022.  Preoperative chemistries revealed that patient was hyperkalemic as per above.  Result forwarded to patient's primary attending surgeon and his APP for review.    Order entered to have patient K+ level rechecked on the day of his procedure to ensure optimization/correction of his preoperative hyperkalemia.  Honor Loh, MSN, APRN, FNP-C, CEN [facility]  Peri-operative Services Nurse Practitioner Phone: [phone] Fax: [phone] 07/02/22 11:35 AM

## 2022-07-03 ENCOUNTER — Encounter: Payer: Self-pay | Admitting: Nurse Practitioner

## 2022-07-07 MED ORDER — ORAL CARE MOUTH RINSE: 15.0000 mL | Freq: Once | OROMUCOSAL | Status: DC

## 2022-07-07 MED ORDER — CHLORHEXIDINE GLUCONATE CLOTH 2 % EX PADS: 6.0000 | MEDICATED_PAD | Freq: Once | CUTANEOUS | Status: DC

## 2022-07-07 MED ORDER — CEFAZOLIN SODIUM-DEXTROSE 2-4 GM/100ML-% IV SOLN: 2.0000 g | INTRAVENOUS | Status: DC

## 2022-07-07 MED ORDER — SODIUM CHLORIDE 0.9 % IV SOLN: INTRAVENOUS | Status: DC

## 2022-07-07 MED ORDER — CHLORHEXIDINE GLUCONATE 0.12 % MT SOLN: 15.0000 mL | Freq: Once | OROMUCOSAL | Status: DC

## 2022-07-08 ENCOUNTER — Encounter: Payer: Self-pay | Admitting: Vascular Surgery

## 2022-07-08 ENCOUNTER — Encounter
Admission: RE | Disposition: A | Discharge status: Home / Self Care | Payer: Self-pay | Source: Home / Self Care | Attending: Vascular Surgery

## 2022-07-08 ENCOUNTER — Ambulatory Visit
Admission: RE | Admit: 2022-07-08 | Discharge: 2022-07-08 | Disposition: A | Discharge status: Home / Self Care | Payer: Commercial Managed Care - HMO | Attending: Vascular Surgery | Admitting: Vascular Surgery

## 2022-07-08 DIAGNOSIS — F1290 Cannabis use, unspecified, uncomplicated: Secondary | ICD-10-CM | POA: Diagnosis not present

## 2022-07-08 DIAGNOSIS — N184 Chronic kidney disease, stage 4 (severe): Secondary | ICD-10-CM | POA: Insufficient documentation

## 2022-07-08 DIAGNOSIS — Z01818 Encounter for other preprocedural examination: Secondary | ICD-10-CM | POA: Insufficient documentation

## 2022-07-08 DIAGNOSIS — F17210 Nicotine dependence, cigarettes, uncomplicated: Secondary | ICD-10-CM | POA: Insufficient documentation

## 2022-07-08 DIAGNOSIS — E10618 Type 1 diabetes mellitus with other diabetic arthropathy: Principal | ICD-10-CM

## 2022-07-08 DIAGNOSIS — E1069 Type 1 diabetes mellitus with other specified complication: Secondary | ICD-10-CM | POA: Insufficient documentation

## 2022-07-08 DIAGNOSIS — I129 Hypertensive chronic kidney disease with stage 1 through stage 4 chronic kidney disease, or unspecified chronic kidney disease: Secondary | ICD-10-CM | POA: Diagnosis not present

## 2022-07-08 DIAGNOSIS — E1022 Type 1 diabetes mellitus with diabetic chronic kidney disease: Secondary | ICD-10-CM | POA: Diagnosis not present

## 2022-07-08 HISTORY — DX: Other psychoactive substance abuse, uncomplicated: F19.10

## 2022-07-08 LAB — URINE DRUG SCREEN, QUALITATIVE (ARMC ONLY)
Amphetamines, Ur Screen: NOT DETECTED
Barbiturates, Ur Screen: NOT DETECTED
Benzodiazepine, Ur Scrn: NOT DETECTED
Cannabinoid 50 Ng, Ur ~~LOC~~: NOT DETECTED
Cocaine Metabolite,Ur ~~LOC~~: POSITIVE — AB
MDMA (Ecstasy)Ur Screen: NOT DETECTED
Methadone Scn, Ur: POSITIVE — AB
Opiate, Ur Screen: NOT DETECTED
Phencyclidine (PCP) Ur S: NOT DETECTED
Tricyclic, Ur Screen: POSITIVE — AB

## 2022-07-08 LAB — ABO/RH: ABO/RH(D): O POS

## 2022-07-08 SURGERY — AMPUTATION BELOW KNEE
Anesthesia: General | Site: Knee | Laterality: Left

## 2022-07-08 MED ORDER — PROPOFOL 10 MG/ML IV BOLUS
INTRAVENOUS | Status: AC
  Filled 2022-07-08: qty 20

## 2022-07-08 MED ORDER — FENTANYL CITRATE (PF) 100 MCG/2ML IJ SOLN
INTRAMUSCULAR | Status: AC
  Filled 2022-07-08: qty 2

## 2022-07-08 MED ORDER — MIDAZOLAM HCL 2 MG/2ML IJ SOLN
INTRAMUSCULAR | Status: AC
  Filled 2022-07-08: qty 2

## 2022-07-08 SURGICAL SUPPLY — 38 items
BLADE SAGITTAL WIDE XTHICK NO (BLADE) IMPLANT
BLADE SAW SAG 25.4X90 (BLADE) ×1 IMPLANT
BNDG COHESIVE 4X5 TAN STRL LF (GAUZE/BANDAGES/DRESSINGS) ×1 IMPLANT
BNDG ELASTIC 6X5.8 VLCR NS LF (GAUZE/BANDAGES/DRESSINGS) ×1 IMPLANT
BNDG GAUZE DERMACEA FLUFF 4 (GAUZE/BANDAGES/DRESSINGS) ×2 IMPLANT
BRUSH SCRUB EZ  4% CHG (MISCELLANEOUS) ×1
BRUSH SCRUB EZ 4% CHG (MISCELLANEOUS) ×1 IMPLANT
CHLORAPREP W/TINT 26 (MISCELLANEOUS) ×1 IMPLANT
DRAPE INCISE IOBAN 66X45 STRL (DRAPES) IMPLANT
ELECT CAUTERY BLADE 6.4 (BLADE) ×1 IMPLANT
ELECT REM PT RETURN 9FT ADLT (ELECTROSURGICAL) ×1
ELECTRODE REM PT RTRN 9FT ADLT (ELECTROSURGICAL) ×1 IMPLANT
GAUZE XEROFORM 1X8 LF (GAUZE/BANDAGES/DRESSINGS) ×2 IMPLANT
GLOVE BIO SURGEON STRL SZ7 (GLOVE) ×2 IMPLANT
GOWN STRL REUS W/ TWL LRG LVL3 (GOWN DISPOSABLE) ×2 IMPLANT
GOWN STRL REUS W/ TWL XL LVL3 (GOWN DISPOSABLE) ×1 IMPLANT
GOWN STRL REUS W/TWL LRG LVL3 (GOWN DISPOSABLE) ×2
GOWN STRL REUS W/TWL XL LVL3 (GOWN DISPOSABLE) ×1
HANDLE YANKAUER SUCT BULB TIP (MISCELLANEOUS) ×1 IMPLANT
KIT TURNOVER KIT A (KITS) ×1 IMPLANT
LABEL OR SOLS (LABEL) ×1 IMPLANT
MANIFOLD NEPTUNE II (INSTRUMENTS) ×1 IMPLANT
NS IRRIG 1000ML POUR BTL (IV SOLUTION) ×1 IMPLANT
PACK EXTREMITY ARMC (MISCELLANEOUS) ×1 IMPLANT
PAD ABD DERMACEA PRESS 5X9 (GAUZE/BANDAGES/DRESSINGS) ×2 IMPLANT
PAD PREP 24X41 OB/GYN DISP (PERSONAL CARE ITEMS) ×1 IMPLANT
SPONGE T-LAP 18X18 ~~LOC~~+RFID (SPONGE) ×1 IMPLANT
STAPLER SKIN PROX 35W (STAPLE) ×1 IMPLANT
STOCKINETTE M/LG 89821 (MISCELLANEOUS) ×1 IMPLANT
SUT SILK 2 0 (SUTURE) ×1
SUT SILK 2 0 SH (SUTURE) ×2 IMPLANT
SUT SILK 2-0 18XBRD TIE 12 (SUTURE) ×1 IMPLANT
SUT SILK 3 0 (SUTURE) ×1
SUT SILK 3-0 18XBRD TIE 12 (SUTURE) ×1 IMPLANT
SUT VIC AB 0 CT1 36 (SUTURE) ×2 IMPLANT
SUT VIC AB 2-0 CT1 (SUTURE) ×2 IMPLANT
TRAP FLUID SMOKE EVACUATOR (MISCELLANEOUS) ×1 IMPLANT
WATER STERILE IRR 500ML POUR (IV SOLUTION) ×1 IMPLANT

## 2022-07-08 NOTE — Anesthesia Preprocedure Evaluation (Signed)
Anesthesia Evaluation  Patient identified by MRN, date of birth, ID band Patient awake    Reviewed: Allergy & Precautions, NPO status , Patient's Chart, lab work & pertinent test results  Airway Mallampati: III  TM Distance: >3 FB Neck ROM: full    Dental  (+) Chipped   Pulmonary Current Smoker   Pulmonary exam normal        Cardiovascular hypertension, Normal cardiovascular exam     Neuro/Psych negative neurological ROS  negative psych ROS   GI/Hepatic Neg liver ROS,GERD  ,,  Endo/Other  diabetes, Well Controlled    Renal/GU Renal disease     Musculoskeletal   Abdominal   Peds  Hematology negative hematology ROS (+)   Anesthesia Other Findings Past Medical History: No date: CKD (chronic kidney disease), stage IV (HCC) No date: Diabetes mellitus without complication (HCC) No date: GERD (gastroesophageal reflux disease) No date: Hypertension No date: MRSA infection     Comment:  in blood per patient No date: Polysubstance abuse ([place])     Comment:  a.) THC + cocaine + TCA  Past Surgical History: No date: APPENDECTOMY 2019: FOOT SURGERY; Left     Reproductive/Obstetrics negative OB ROS                              Anesthesia Physical Anesthesia Plan  ASA: 2  Anesthesia Plan: General LMA   Post-op Pain Management: Tylenol PO (pre-op)*, Gabapentin PO (pre-op)* and Celebrex PO (pre-op)*   Induction: Intravenous  PONV Risk Score and Plan: 2 and Dexamethasone, Ondansetron and Midazolam  Airway Management Planned: LMA  Additional Equipment:   Intra-op Plan:   Post-operative Plan:   Informed Consent:      Dental Advisory Given  Plan Discussed with: Anesthesiologist, CRNA and Surgeon  Anesthesia Plan Comments:          Anesthesia Quick Evaluation

## 2022-07-08 NOTE — Interval H&P Note (Signed)
History and Physical Interval Note:  07/08/2022 2:15 PM  Jason Francis  has presented today for surgery, with the diagnosis of CHARCOT FOOT.  The various methods of treatment have been discussed with the patient and family. After consideration of risks, benefits and other options for treatment, the patient has consented to  Procedure(s): AMPUTATION BELOW KNEE (Left) as a surgical intervention.  The patient's history has been reviewed, patient examined, no change in status, stable for surgery.  I have reviewed the patient's chart and labs.  Questions were answered to the patient's satisfaction.     Leotis Pain

## 2022-07-16 ENCOUNTER — Telehealth (INDEPENDENT_AMBULATORY_CARE_PROVIDER_SITE_OTHER): Payer: Self-pay

## 2022-07-16 NOTE — Telephone Encounter (Signed)
Spoke with the patient's mother and he is scheduled with Dr. Lucky Cowboy on 08/05/22 at the [facility] for a left BKA. Pre-op phone call review is on 07/30/22 between 8-1 pm. Pre-surgical instructions were discussed and will be mailed.

## 2022-07-26 ENCOUNTER — Telehealth: Payer: Self-pay | Admitting: Nurse Practitioner

## 2022-07-26 NOTE — Telephone Encounter (Signed)
Left vm and sent mychart message to confirm 08/02/22 appointment-Toni 

## 2022-07-29 ENCOUNTER — Other Ambulatory Visit (INDEPENDENT_AMBULATORY_CARE_PROVIDER_SITE_OTHER): Payer: Self-pay | Admitting: Nurse Practitioner

## 2022-07-29 DIAGNOSIS — E11610 Type 2 diabetes mellitus with diabetic neuropathic arthropathy: Secondary | ICD-10-CM

## 2022-07-30 ENCOUNTER — Encounter
Admission: RE | Admit: 2022-07-30 | Discharge: 2022-07-30 | Disposition: A | Discharge status: Home / Self Care | Payer: Commercial Managed Care - HMO | Source: Ambulatory Visit | Attending: Vascular Surgery | Admitting: Vascular Surgery

## 2022-07-30 DIAGNOSIS — I10 Essential (primary) hypertension: Secondary | ICD-10-CM

## 2022-07-30 DIAGNOSIS — F1190 Opioid use, unspecified, uncomplicated: Secondary | ICD-10-CM

## 2022-07-30 DIAGNOSIS — N184 Chronic kidney disease, stage 4 (severe): Secondary | ICD-10-CM

## 2022-07-30 DIAGNOSIS — Z01812 Encounter for preprocedural laboratory examination: Secondary | ICD-10-CM

## 2022-07-30 HISTORY — DX: Type 1 diabetes mellitus with diabetic chronic kidney disease: E10.22

## 2022-07-30 HISTORY — DX: Secondary hyperparathyroidism of renal origin: N25.81

## 2022-07-30 HISTORY — DX: Type 2 diabetes mellitus with diabetic neuropathic arthropathy: E11.610

## 2022-07-30 HISTORY — DX: Chronic multifocal osteomyelitis, left ankle and foot: M86.372

## 2022-07-30 HISTORY — DX: Unspecified viral hepatitis C without hepatic coma: B19.20

## 2022-07-30 HISTORY — DX: Anemia, unspecified: D64.9

## 2022-07-30 NOTE — Patient Instructions (Addendum)
Your procedure is scheduled on: Thursday, December 7 Report to the Registration Desk on the 1st floor of the [facility]. To find out your arrival time, please call [phone] between 1PM - 3PM on: Wednesday, December 6 If your arrival time is 6:00 am, do not arrive prior to that time as the [place] entrance doors do not open until 6:00 am.  REMEMBER: Instructions that are not followed completely may result in serious medical risk, up to and including death; or upon the discretion of your surgeon and anesthesiologist your surgery may need to be rescheduled.  Do not eat or drink after midnight the night before surgery.  No gum chewing, lozengers or hard candies.  TAKE THESE MEDICATIONS THE MORNING OF SURGERY WITH A SIP OF WATER:  Carvedilol Pantoprazole (Protonix) - (take one the night before and one on the morning of surgery - helps to prevent nausea after surgery.) Methadone  Do not take any insulin on the morning of surgery.  One week prior to surgery: starting today, December 1 Stop Anti-inflammatories (NSAIDS) such as Advil, Aleve, Ibuprofen, Motrin, Naproxen, Naprosyn and Aspirin based products such as Excedrin, Goodys Powder, BC Powder. Stop ANY OVER THE COUNTER supplements until after surgery. You may however, continue to take Tylenol if needed for pain up until the day of surgery.  No Alcohol for 24 hours before or after surgery.  No Smoking including e-cigarettes for 24 hours prior to surgery.  No chewable tobacco products for at least 6 hours prior to surgery.  No nicotine patches on the day of surgery.  Do not use any "recreational" drugs for at least a week prior to your surgery.  Please be advised that the combination of cocaine and anesthesia may have negative outcomes, up to and including death. If you test positive for cocaine, your surgery will be cancelled.  On the morning of surgery brush your teeth with toothpaste and water, you may rinse your mouth  with mouthwash if you wish. Do not swallow any toothpaste or mouthwash.  Use CHG Soap as directed on instruction sheet.  Do not wear jewelry, make-up, hairpins, clips or nail polish.  Do not wear lotions, powders, or perfumes.   Do not shave body from the neck down 48 hours prior to surgery just in case you cut yourself which could leave a site for infection.  Also, freshly shaved skin may become irritated if using the CHG soap.  Contact lenses, hearing aids and dentures may not be worn into surgery.  Do not bring valuables to the hospital. [facility] is not responsible for any missing/lost belongings or valuables.   Notify your doctor if there is any change in your medical condition (cold, fever, infection).  Wear comfortable clothing (specific to your surgery type) to the hospital.  After surgery, you can help prevent lung complications by doing breathing exercises.  Take deep breaths and cough every 1-2 hours. Your doctor may order a device called an Incentive Spirometer to help you take deep breaths.  If you are being admitted to the hospital overnight, leave your suitcase in the car. After surgery it may be brought to your room.  Please call the [facility] at [phone] if you have any questions about these instructions.  Surgery Visitation Policy:  Patients undergoing a surgery or procedure may have two family members or support persons with them as long as the person is not COVID-19 positive or experiencing its symptoms.   Inpatient Visitation:  Visiting hours are 7 a.m. to 8 p.m. Up to four visitors are allowed at one time in a patient room. The visitors may rotate out with other people during the day. One designated support person (adult) may remain overnight.  MASKING: Due to an increase in RSV rates and hospitalizations, in-patient care areas in which we serve newborns, infants and children, masks will be required for teammates and visitors.   Children ages 52 and under may not visit. This policy affects the following departments only:  [facility] area Mother Baby Unit Newborn nursery/Special care nursery  Other areas: Masks continue to be strongly recommended for [facility] teammates, visitors and patients in all other areas. Visitation is not restricted outside of the units listed above.     Preparing for Surgery with CHLORHEXIDINE GLUCONATE (CHG) Soap  Chlorhexidine Gluconate (CHG) Soap  o An antiseptic cleaner that kills germs and bonds with the skin to continue killing germs even after washing  o Used for showering the night before surgery and morning of surgery  Before surgery, you can play an important role by reducing the number of germs on your skin.  CHG (Chlorhexidine gluconate) soap is an antiseptic cleanser which kills germs and bonds with the skin to continue killing germs even after washing.  Please do not use if you have an allergy to CHG or antibacterial soaps. If your skin becomes reddened/irritated stop using the CHG.  1. Shower the NIGHT BEFORE SURGERY and the MORNING OF SURGERY with CHG soap.  2. If you choose to wash your hair, wash your hair first as usual with your normal shampoo.  3. After shampooing, rinse your hair and body thoroughly to remove the shampoo.  4. Use CHG as you would any other liquid soap. You can apply CHG directly to the skin and wash gently with a scrungie or a clean washcloth.  5. Apply the CHG soap to your body only from the neck down. Do not use on open wounds or open sores. Avoid contact with your eyes, ears, mouth, and genitals (private parts). Wash face and genitals (private parts) with your normal soap.  6. Wash thoroughly, paying special attention to the area where your surgery will be performed.  7. Thoroughly rinse your body with warm water.  8. Do not shower/wash with your normal soap after using and rinsing off the CHG  soap.  9. Pat yourself dry with a clean towel.  10. Wear clean pajamas to bed the night before surgery.  12. Place clean sheets on your bed the night of your first shower and do not sleep with pets.  13. Shower again with the CHG soap on the day of surgery prior to arriving at the hospital.  14. Do not apply any deodorants/lotions/powders.  15. Please wear clean clothes to the hospital.

## 2022-08-02 ENCOUNTER — Encounter: Payer: Managed Care, Other (non HMO) | Admitting: Nurse Practitioner

## 2022-08-03 ENCOUNTER — Encounter: Payer: Self-pay | Admitting: Urgent Care

## 2022-08-03 ENCOUNTER — Encounter
Admission: RE | Admit: 2022-08-03 | Discharge: 2022-08-03 | Disposition: A | Discharge status: Home / Self Care | Payer: Commercial Managed Care - HMO | Source: Ambulatory Visit | Attending: Vascular Surgery | Admitting: Vascular Surgery

## 2022-08-03 DIAGNOSIS — N184 Chronic kidney disease, stage 4 (severe): Secondary | ICD-10-CM | POA: Diagnosis not present

## 2022-08-03 DIAGNOSIS — F1490 Cocaine use, unspecified, uncomplicated: Secondary | ICD-10-CM | POA: Diagnosis not present

## 2022-08-03 DIAGNOSIS — Z538 Procedure and treatment not carried out for other reasons: Secondary | ICD-10-CM | POA: Diagnosis not present

## 2022-08-03 DIAGNOSIS — F1190 Opioid use, unspecified, uncomplicated: Secondary | ICD-10-CM | POA: Insufficient documentation

## 2022-08-03 DIAGNOSIS — I129 Hypertensive chronic kidney disease with stage 1 through stage 4 chronic kidney disease, or unspecified chronic kidney disease: Secondary | ICD-10-CM | POA: Insufficient documentation

## 2022-08-03 DIAGNOSIS — E1122 Type 2 diabetes mellitus with diabetic chronic kidney disease: Secondary | ICD-10-CM | POA: Diagnosis not present

## 2022-08-03 DIAGNOSIS — R825 Elevated urine levels of drugs, medicaments and biological substances: Secondary | ICD-10-CM | POA: Diagnosis not present

## 2022-08-03 DIAGNOSIS — E11610 Type 2 diabetes mellitus with diabetic neuropathic arthropathy: Secondary | ICD-10-CM | POA: Insufficient documentation

## 2022-08-03 DIAGNOSIS — Z01812 Encounter for preprocedural laboratory examination: Secondary | ICD-10-CM | POA: Insufficient documentation

## 2022-08-03 DIAGNOSIS — I10 Essential (primary) hypertension: Secondary | ICD-10-CM

## 2022-08-03 DIAGNOSIS — Z01818 Encounter for other preprocedural examination: Secondary | ICD-10-CM | POA: Diagnosis not present

## 2022-08-03 LAB — CBC
HCT: 32.6 % — ABNORMAL LOW (ref 39.0–52.0)
Hemoglobin: 10.2 g/dL — ABNORMAL LOW (ref 13.0–17.0)
MCH: 27.7 pg (ref 26.0–34.0)
MCHC: 31.3 g/dL (ref 30.0–36.0)
MCV: 88.6 fL (ref 80.0–100.0)
Platelets: 316 10*3/uL (ref 150–400)
RBC: 3.68 MIL/uL — ABNORMAL LOW (ref 4.22–5.81)
RDW: 13.1 % (ref 11.5–15.5)
WBC: 11.4 10*3/uL — ABNORMAL HIGH (ref 4.0–10.5)
nRBC: 0 % (ref 0.0–0.2)

## 2022-08-03 LAB — BASIC METABOLIC PANEL
Anion gap: 9 (ref 5–15)
BUN: 61 mg/dL — ABNORMAL HIGH (ref 6–20)
CO2: 18 mmol/L — ABNORMAL LOW (ref 22–32)
Calcium: 9.2 mg/dL (ref 8.9–10.3)
Chloride: 107 mmol/L (ref 98–111)
Creatinine, Ser: 3.72 mg/dL — ABNORMAL HIGH (ref 0.61–1.24)
GFR, Estimated: 21 mL/min — ABNORMAL LOW (ref >60)
Glucose, Bld: 204 mg/dL — ABNORMAL HIGH (ref 70–99)
Potassium: 5.2 mmol/L — ABNORMAL HIGH (ref 3.5–5.1)
Sodium: 134 mmol/L — ABNORMAL LOW (ref 135–145)

## 2022-08-03 LAB — TYPE AND SCREEN
ABO/RH(D): O POS
Antibody Screen: NEGATIVE

## 2022-08-03 LAB — URINE DRUG SCREEN, QUALITATIVE (ARMC ONLY)
Amphetamines, Ur Screen: NOT DETECTED
Barbiturates, Ur Screen: NOT DETECTED
Benzodiazepine, Ur Scrn: NOT DETECTED
Cannabinoid 50 Ng, Ur ~~LOC~~: NOT DETECTED
Cocaine Metabolite,Ur ~~LOC~~: POSITIVE — AB
MDMA (Ecstasy)Ur Screen: NOT DETECTED
Methadone Scn, Ur: POSITIVE — AB
Opiate, Ur Screen: NOT DETECTED
Phencyclidine (PCP) Ur S: NOT DETECTED
Tricyclic, Ur Screen: NOT DETECTED

## 2022-08-03 NOTE — Progress Notes (Addendum)
  [facility] Perioperative Services: Pre-Admission/Anesthesia Testing  Abnormal Lab Notification   Date: 08/03/22  Name: Jason Francis MRN:   [id number]  Re: Abnormal labs noted during PAT appointment   Notified:  Provider Name Provider Role Notification Mode  Dew, Erskine Squibb, MD Vascular Surgery (Surgeon) Routed and/or faxed via Jones Skene, NP-C Vascular Surgeon (APP) Routed and/or faxed via Nora and Notes:  ABNORMAL LAB VALUE(S):  Hospital Outpatient Visit on 08/03/2022  Component Date Value   Tricyclic, Ur Screen 88/82/8003 NONE DETECTED    Amphetamines, Ur Screen 08/03/2022 NONE DETECTED    MDMA (Ecstasy)Ur Screen 08/03/2022 NONE DETECTED    Cocaine Metabolite,Ur [place] 08/03/2022 POSITIVE (A)    Opiate, Ur Screen 08/03/2022 NONE DETECTED    Phencyclidine (PCP) Ur S 08/03/2022 NONE DETECTED    Cannabinoid 50 Ng, Ur [place] 08/03/2022 NONE DETECTED    Barbiturates, Ur Screen 08/03/2022 NONE DETECTED    Benzodiazepine, Ur Scrn 08/03/2022 NONE DETECTED    Methadone Scn, Ur 08/03/2022 POSITIVE (A)    Jason Francis is scheduled for a LEFT BELOW KNEE AMPUTATION on 08/05/2022. Patient with a PMH (+) polysubstance abuse. He has had multiple (+) urine drug screens in the past. Preoperative UDS performed in order to determine what substances patient may have potentially ingested prior to upcoming orthopedic/vascular surgery.   Impression and Plan:  Patient was (+) for cocaine + methadone today. Methadone is prescribed to the patient. The main cocaine metabolite tested for in urine, benzoylecgonine, can be detected for up to +/- 10 days (average 7 days) following cocaine use. Cocaine use, in the setting of general anesthesia administration, places the patient at significantly increased risks of potential perioperative complications. Complications can include, but are not limited to, malignant hypertension, tachyarrhythmias/dysrhythmias, prolonged QTc  intervals, myocardial ischemia/vasospasm, convulsions, aortic dissection, CVA, and premature death.   He was previous advised, both verbally and in writing, that he would need to abstain from the use of illegal substances during the perioperative and immediate postoperative periods. Encouraged cessation all together. Surgery has already been cancelled once (07/08/2022).  Patient is aware that if he is cocaine (+) on the day of his procedure, given the elective nature, his surgery will be postponed pending surgeon discretion and commitment from the patient to refrain from the use of illegal substances as discussed during his appointment with the PAT staff. Patient verbalized understanding of all of the aforementioned.   Results of this testing HAS NOT been discussed with patient, nor are any changes being made to the OR schedule at this point.  Note being sent to primary attending surgeon to make him aware of the results and plans for repeat UDS on the day of surgery; order has been entered. Based on clinical condition of the patient, decision will need to be made as to whether or not this case should be deemed urgent/emergent or if we are safe to postpone pending negative drug screen.   Honor Loh, MSN, APRN, FNP-C, CEN [facility]  Peri-operative Services Nurse Practitioner Phone: [phone] Fax: [phone] 08/03/22 11:32 AM

## 2022-08-04 MED ORDER — CHLORHEXIDINE GLUCONATE CLOTH 2 % EX PADS: 6.0000 | MEDICATED_PAD | Freq: Once | CUTANEOUS | Status: DC

## 2022-08-04 MED ORDER — CHLORHEXIDINE GLUCONATE 0.12 % MT SOLN: 15.0000 mL | Freq: Once | OROMUCOSAL | Status: DC

## 2022-08-04 MED ORDER — SODIUM CHLORIDE 0.9 % IV SOLN: INTRAVENOUS | Status: DC

## 2022-08-04 MED ORDER — ORAL CARE MOUTH RINSE: 15.0000 mL | Freq: Once | OROMUCOSAL | Status: DC

## 2022-08-04 MED ORDER — CEFAZOLIN SODIUM-DEXTROSE 2-4 GM/100ML-% IV SOLN: 2.0000 g | INTRAVENOUS | Status: DC

## 2022-08-04 NOTE — Progress Notes (Signed)
  Perioperative Services Pre-Admission/Anesthesia Testing    Date: 08/04/22  Name: Jason Francis MRN:   [id number]  Re: Plans for surgery  Planned Surgical Procedure(s):    Case: [id number] Date/Time: 08/05/22 1215   Procedure: AMPUTATION BELOW KNEE (Left: Knee)   Anesthesia type: General   Pre-op diagnosis: CHARCOT FOOT   Location: [facility]   Surgeons: Algernon Huxley, MD   Clinical Notes:  Patient is scheduled for the above procedure on 08/05/2022 with Dr. Leotis Pain, MD. Patient with (+) UDS yesterday (08/03/2022). Sample (+) for methadone + cocaine. Results communicated to surgeon and vascular APP.   Reached out to surgeon's office on 08/04/2022 to determine whether or not case was going to proceed or if we were postponing again pending a negative UDS. Office communicated to Chief Technology Officer that patient was being left on the OR schedule with plans to retest his urine prior to his procedure. In the event of another positive result, it is unclear at this point whether the case will be deemed urgent/emergent. Case details will need to be discussed between surgery and anesthesia following review of repeat UDS results.   Honor Loh, MSN, APRN, FNP-C, CEN [facility]  Peri-operative Services Nurse Practitioner Phone: [phone] 08/04/22 9:38 AM  NOTE: This note has been prepared using Dragon dictation software. Despite my best ability to proofread, there is always the potential that unintentional transcriptional errors may still occur from this process.

## 2022-08-05 ENCOUNTER — Encounter: Payer: Self-pay | Admitting: Vascular Surgery

## 2022-08-05 ENCOUNTER — Ambulatory Visit
Admission: RE | Admit: 2022-08-05 | Discharge: 2022-08-05 | Disposition: A | Discharge status: Home / Self Care | Payer: Commercial Managed Care - HMO | Attending: Vascular Surgery | Admitting: Vascular Surgery

## 2022-08-05 ENCOUNTER — Other Ambulatory Visit: Payer: Self-pay

## 2022-08-05 ENCOUNTER — Encounter
Admission: RE | Disposition: A | Discharge status: Home / Self Care | Payer: Self-pay | Source: Home / Self Care | Attending: Vascular Surgery

## 2022-08-05 DIAGNOSIS — Z01818 Encounter for other preprocedural examination: Secondary | ICD-10-CM | POA: Diagnosis not present

## 2022-08-05 DIAGNOSIS — Z01812 Encounter for preprocedural laboratory examination: Secondary | ICD-10-CM

## 2022-08-05 DIAGNOSIS — Z538 Procedure and treatment not carried out for other reasons: Secondary | ICD-10-CM | POA: Insufficient documentation

## 2022-08-05 DIAGNOSIS — I10 Essential (primary) hypertension: Secondary | ICD-10-CM

## 2022-08-05 DIAGNOSIS — F1190 Opioid use, unspecified, uncomplicated: Secondary | ICD-10-CM | POA: Insufficient documentation

## 2022-08-05 DIAGNOSIS — I129 Hypertensive chronic kidney disease with stage 1 through stage 4 chronic kidney disease, or unspecified chronic kidney disease: Secondary | ICD-10-CM | POA: Insufficient documentation

## 2022-08-05 DIAGNOSIS — N184 Chronic kidney disease, stage 4 (severe): Secondary | ICD-10-CM | POA: Insufficient documentation

## 2022-08-05 LAB — URINE DRUG SCREEN, QUALITATIVE (ARMC ONLY)
Amphetamines, Ur Screen: NOT DETECTED
Barbiturates, Ur Screen: NOT DETECTED
Benzodiazepine, Ur Scrn: NOT DETECTED
Cannabinoid 50 Ng, Ur ~~LOC~~: NOT DETECTED
Cocaine Metabolite,Ur ~~LOC~~: POSITIVE — AB
MDMA (Ecstasy)Ur Screen: NOT DETECTED
Methadone Scn, Ur: POSITIVE — AB
Opiate, Ur Screen: NOT DETECTED
Phencyclidine (PCP) Ur S: NOT DETECTED
Tricyclic, Ur Screen: POSITIVE — AB

## 2022-08-05 LAB — POCT I-STAT, CHEM 8
BUN: 59 mg/dL — ABNORMAL HIGH (ref 6–20)
Calcium, Ion: 1.22 mmol/L (ref 1.15–1.40)
Chloride: 108 mmol/L (ref 98–111)
Creatinine, Ser: 4.8 mg/dL — ABNORMAL HIGH (ref 0.61–1.24)
Glucose, Bld: 139 mg/dL — ABNORMAL HIGH (ref 70–99)
HCT: 33 % — ABNORMAL LOW (ref 39.0–52.0)
Hemoglobin: 11.2 g/dL — ABNORMAL LOW (ref 13.0–17.0)
Potassium: 5.5 mmol/L — ABNORMAL HIGH (ref 3.5–5.1)
Sodium: 135 mmol/L (ref 135–145)
TCO2: 20 mmol/L — ABNORMAL LOW (ref 22–32)

## 2022-08-05 SURGERY — AMPUTATION BELOW KNEE
Anesthesia: General | Site: Knee | Laterality: Left

## 2022-08-05 MED ORDER — CEFAZOLIN SODIUM-DEXTROSE 2-4 GM/100ML-% IV SOLN
INTRAVENOUS | Status: AC
  Filled 2022-08-05: qty 100

## 2022-08-05 MED ORDER — CHLORHEXIDINE GLUCONATE 0.12 % MT SOLN
OROMUCOSAL | Status: AC
  Filled 2022-08-05: qty 15

## 2022-08-05 SURGICAL SUPPLY — 38 items
BLADE SAGITTAL WIDE XTHICK NO (BLADE) IMPLANT
BLADE SAW SAG 25.4X90 (BLADE) ×1 IMPLANT
BNDG COHESIVE 4X5 TAN STRL LF (GAUZE/BANDAGES/DRESSINGS) ×1 IMPLANT
BNDG ELASTIC 6X5.8 VLCR NS LF (GAUZE/BANDAGES/DRESSINGS) ×1 IMPLANT
BNDG GAUZE DERMACEA FLUFF 4 (GAUZE/BANDAGES/DRESSINGS) ×2 IMPLANT
BRUSH SCRUB EZ  4% CHG (MISCELLANEOUS) ×1
BRUSH SCRUB EZ 4% CHG (MISCELLANEOUS) ×1 IMPLANT
CHLORAPREP W/TINT 26 (MISCELLANEOUS) ×1 IMPLANT
DRAPE INCISE IOBAN 66X45 STRL (DRAPES) IMPLANT
ELECT CAUTERY BLADE 6.4 (BLADE) ×1 IMPLANT
ELECT REM PT RETURN 9FT ADLT (ELECTROSURGICAL) ×1
ELECTRODE REM PT RTRN 9FT ADLT (ELECTROSURGICAL) ×1 IMPLANT
GAUZE XEROFORM 1X8 LF (GAUZE/BANDAGES/DRESSINGS) ×2 IMPLANT
GLOVE BIO SURGEON STRL SZ7 (GLOVE) ×2 IMPLANT
GOWN STRL REUS W/ TWL LRG LVL3 (GOWN DISPOSABLE) ×2 IMPLANT
GOWN STRL REUS W/ TWL XL LVL3 (GOWN DISPOSABLE) ×1 IMPLANT
GOWN STRL REUS W/TWL LRG LVL3 (GOWN DISPOSABLE) ×2
GOWN STRL REUS W/TWL XL LVL3 (GOWN DISPOSABLE) ×1
HANDLE YANKAUER SUCT BULB TIP (MISCELLANEOUS) ×1 IMPLANT
KIT TURNOVER KIT A (KITS) ×1 IMPLANT
LABEL OR SOLS (LABEL) ×1 IMPLANT
MANIFOLD NEPTUNE II (INSTRUMENTS) ×1 IMPLANT
NS IRRIG 1000ML POUR BTL (IV SOLUTION) ×1 IMPLANT
PACK EXTREMITY ARMC (MISCELLANEOUS) ×1 IMPLANT
PAD ABD DERMACEA PRESS 5X9 (GAUZE/BANDAGES/DRESSINGS) ×2 IMPLANT
PAD PREP 24X41 OB/GYN DISP (PERSONAL CARE ITEMS) ×1 IMPLANT
SPONGE T-LAP 18X18 ~~LOC~~+RFID (SPONGE) ×1 IMPLANT
STAPLER SKIN PROX 35W (STAPLE) ×1 IMPLANT
STOCKINETTE M/LG 89821 (MISCELLANEOUS) ×1 IMPLANT
SUT SILK 2 0 (SUTURE) ×1
SUT SILK 2 0 SH (SUTURE) ×2 IMPLANT
SUT SILK 2-0 18XBRD TIE 12 (SUTURE) ×1 IMPLANT
SUT SILK 3 0 (SUTURE) ×1
SUT SILK 3-0 18XBRD TIE 12 (SUTURE) ×1 IMPLANT
SUT VIC AB 0 CT1 36 (SUTURE) ×2 IMPLANT
SUT VIC AB 2-0 CT1 (SUTURE) ×2 IMPLANT
TRAP FLUID SMOKE EVACUATOR (MISCELLANEOUS) ×1 IMPLANT
WATER STERILE IRR 500ML POUR (IV SOLUTION) ×1 IMPLANT

## 2022-08-10 ENCOUNTER — Telehealth: Payer: Self-pay | Admitting: Nurse Practitioner

## 2022-08-10 NOTE — Telephone Encounter (Signed)
Received treatment plan from [place]. Gave to Alyssa to complete-Toni

## 2022-09-20 ENCOUNTER — Telehealth: Payer: Self-pay | Admitting: Nurse Practitioner

## 2022-09-20 ENCOUNTER — Other Ambulatory Visit: Payer: Self-pay | Admitting: Nurse Practitioner

## 2022-09-20 DIAGNOSIS — Z760 Encounter for issue of repeat prescription: Secondary | ICD-10-CM

## 2022-09-20 NOTE — Telephone Encounter (Signed)
Called pt to schedule appointment for med refill, unable to leave voicemail mailbox was full

## 2022-10-21 ENCOUNTER — Ambulatory Visit: Payer: Commercial Managed Care - HMO | Admitting: Nurse Practitioner

## 2022-10-25 ENCOUNTER — Ambulatory Visit (INDEPENDENT_AMBULATORY_CARE_PROVIDER_SITE_OTHER): Payer: Commercial Managed Care - HMO | Admitting: Nurse Practitioner

## 2022-10-25 ENCOUNTER — Encounter: Payer: Self-pay | Admitting: Nurse Practitioner

## 2022-10-25 VITALS — BP 180/88 | HR 87 | Temp 98.3°F | Resp 16 | Ht 68.0 in | Wt 189.0 lb

## 2022-10-25 DIAGNOSIS — S161XXA Strain of muscle, fascia and tendon at neck level, initial encounter: Secondary | ICD-10-CM

## 2022-10-25 DIAGNOSIS — E1159 Type 2 diabetes mellitus with other circulatory complications: Secondary | ICD-10-CM | POA: Diagnosis not present

## 2022-10-25 DIAGNOSIS — R1114 Bilious vomiting: Secondary | ICD-10-CM | POA: Diagnosis not present

## 2022-10-25 DIAGNOSIS — N184 Chronic kidney disease, stage 4 (severe): Secondary | ICD-10-CM | POA: Diagnosis not present

## 2022-10-25 DIAGNOSIS — Z760 Encounter for issue of repeat prescription: Secondary | ICD-10-CM

## 2022-10-25 DIAGNOSIS — E1022 Type 1 diabetes mellitus with diabetic chronic kidney disease: Secondary | ICD-10-CM | POA: Diagnosis not present

## 2022-10-25 DIAGNOSIS — I152 Hypertension secondary to endocrine disorders: Secondary | ICD-10-CM

## 2022-10-25 DIAGNOSIS — Z79899 Other long term (current) drug therapy: Secondary | ICD-10-CM

## 2022-10-25 LAB — POCT GLYCOSYLATED HEMOGLOBIN (HGB A1C): Hemoglobin A1C: 7.5 % — AB (ref 4.0–5.6)

## 2022-10-25 MED ORDER — BENAZEPRIL HCL 10 MG PO TABS: 10.0000 mg | ORAL_TABLET | Freq: Every day | ORAL | 1 refills | Status: DC

## 2022-10-25 MED ORDER — PANTOPRAZOLE SODIUM 40 MG PO TBEC: 40.0000 mg | DELAYED_RELEASE_TABLET | Freq: Every day | ORAL | 1 refills | Status: DC

## 2022-10-25 MED ORDER — INSULIN REGULAR HUMAN 100 UNIT/ML IJ SOLN: INTRAMUSCULAR | 11 refills | Status: DC

## 2022-10-25 MED ORDER — INSULIN NPH (HUMAN) (ISOPHANE) 100 UNIT/ML ~~LOC~~ SUSP: 20.0000 [IU] | Freq: Two times a day (BID) | SUBCUTANEOUS | 11 refills | Status: DC

## 2022-10-25 MED ORDER — METOCLOPRAMIDE HCL 10 MG PO TABS: 10.0000 mg | ORAL_TABLET | Freq: Two times a day (BID) | ORAL | 1 refills | Status: DC | PRN

## 2022-10-25 MED ORDER — CYCLOBENZAPRINE HCL 10 MG PO TABS: 10.0000 mg | ORAL_TABLET | Freq: Two times a day (BID) | ORAL | 3 refills | Status: DC | PRN

## 2022-10-25 NOTE — Progress Notes (Signed)
[facility] [place], [place] [address]  Internal MEDICINE  Office Visit Note  Patient Name: Jason Francis  [id number]  [id number]  Date of Service: 10/25/2022  Chief Complaint  Patient presents with   Follow-up   Diabetes   Gastroesophageal Reflux   Hypertension   Quality Metric Gaps    Eye Exam    HPI Jason Francis presents for a follow-up visit for diabetes, and hypertension.  Refills of insulin and other meds Elevated BP, still elevated when rechecked, has a lot of pain from dental problems.  Has not had surgery yet, supposed to have left foot and ankle amputation due to [place]. A1c increased to 7.5, will work on diet.     Current Medication: Outpatient Encounter Medications as of 10/25/2022  Medication Sig Note   amLODipine (NORVASC) 5 MG tablet Take 5 mg by mouth at bedtime.    calcitRIOL (ROCALTROL) 0.25 MCG capsule Take 0.25 mcg by mouth daily.    carvedilol (COREG) 6.25 MG tablet Take 6.25 mg by mouth 2 (two) times daily with a meal.    insulin NPH Human (HUMULIN N) 100 UNIT/ML injection Inject 0.2 mLs (20 Units total) into the skin 2 (two) times daily before a meal.    Insulin Pen Needle (PEN NEEDLES 31GX5/16") 31G X 8 MM MISC 1 each by Does not apply route as directed.    insulin regular (HUMULIN R) 100 units/mL injection Inject into skin before meals according to sliding scale. Max dose 48 units per 24 hours    methadone (DOLOPHINE) 1 MG/1ML solution Take 95 mg by mouth daily. Methadone clinic    sodium bicarbonate 650 MG tablet Take 650 mg by mouth 2 (two) times daily.    sodium zirconium cyclosilicate (LOKELMA) 10 g PACK packet Take 10 g by mouth daily.    [DISCONTINUED] benazepril (LOTENSIN) 10 MG tablet Take 1 tablet (10 mg total) by mouth daily.    [DISCONTINUED] cyclobenzaprine (FLEXERIL) 10 MG tablet Take 1 tablet (10 mg total) by mouth 2 (two) times daily as needed for muscle spasms.    [DISCONTINUED] insulin isophane & regular human  KwikPen (HUMULIN 70/30 MIX) (70-30) 100 UNIT/ML KwikPen Inject into the skin.    [DISCONTINUED] insulin regular (NOVOLIN R) 100 units/mL injection Inject 0.04-0.05 mLs (4-5 Units total) into the skin 3 (three) times daily before meals. 08/05/2022: Patient reports he took 4 units for blood sugar of 410.   [DISCONTINUED] metoCLOPramide (REGLAN) 10 MG tablet Take 1 tablet (10 mg total) by mouth 2 (two) times daily as needed for nausea or vomiting.    [DISCONTINUED] NOVOLIN N 100 UNIT/ML injection Inject 0.3 mLs (30 Units total) into the skin 2 (two) times daily before a meal.    [DISCONTINUED] pantoprazole (PROTONIX) 40 MG tablet Take 1 tablet by mouth once daily    benazepril (LOTENSIN) 10 MG tablet Take 1 tablet (10 mg total) by mouth daily.    cyclobenzaprine (FLEXERIL) 10 MG tablet Take 1 tablet (10 mg total) by mouth 2 (two) times daily as needed for muscle spasms.    metoCLOPramide (REGLAN) 10 MG tablet Take 1 tablet (10 mg total) by mouth 2 (two) times daily as needed for nausea or vomiting.    pantoprazole (PROTONIX) 40 MG tablet Take 1 tablet (40 mg total) by mouth daily.    No facility-administered encounter medications on file as of 10/25/2022.    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  Vaping Use   Vaping Use: Former  Substance and Sexual Activity   Alcohol use: Not Currently   Drug use: Yes    Types: Marijuana, Cocaine    Comment: last stated cocaine use 07/27/22; marijuana daily   Sexual activity: Yes    Partners: Female  Other Topics Concern   Not on file  Social History Narrative   Not on file   Social Determinants of Health   Financial Resource Strain: Not on file  Food Insecurity: Not on file  Transportation Needs: Not on file  Physical Activity: Not on file  Stress: Not on file  Social Connections: Not on file  Intimate Partner Violence: Not on file      Review of Systems  Constitutional:  Negative for chills, fatigue and unexpected weight change.  HENT:  Positive for dental problem and postnasal drip. Negative for congestion, rhinorrhea, sneezing and sore throat.   Eyes:  Negative for redness.  Respiratory: Negative.  Negative for cough, chest tightness, shortness of breath and wheezing.   Cardiovascular: Negative.  Negative for chest pain and palpitations.  Gastrointestinal:  Negative for abdominal pain, constipation, diarrhea, nausea and vomiting.  Genitourinary:  Negative for dysuria and frequency.  Musculoskeletal:  Positive for arthralgias (Left neck pain), gait problem (Due to Charcot's joint of the left ankle, ambulates with crutches) and neck pain (Left side). Negative for back pain and joint swelling.  Skin: Negative.  Negative for rash.  Neurological:  Negative for tremors and numbness.   Hematological:  Negative for adenopathy. Does not bruise/bleed easily.  Psychiatric/Behavioral:  Negative for behavioral problems (Depression), sleep disturbance and suicidal ideas. The patient is not nervous/anxious.     Vital Signs: BP (!) 180/88 Comment: 180/100  Pulse 87   Temp 98.3 F (36.8 C)   Resp 16   Ht '5\' 8"'$  (1.727 m)   Wt 189 lb (85.7 kg)   SpO2 99%   BMI 28.74 kg/m    Physical Exam Vitals reviewed.  Constitutional:      General: He is not in acute distress.    Appearance: Normal appearance. He is not ill-appearing.  HENT:     Head: Normocephalic and atraumatic.     Mouth/Throat:     Dentition: Abnormal dentition. Dental tenderness, gingival swelling and dental caries present.  Eyes:     Pupils: Pupils are equal, round, and reactive to light.  Cardiovascular:     Rate and Rhythm: Normal rate and regular rhythm.  Pulmonary:     Effort: Pulmonary effort is normal. No respiratory distress.  Neurological:     Mental Status: He is alert and oriented to person, place, and time.     Gait: Gait abnormal (Due to Charcot's joint of the left ankle).  Psychiatric:        Mood and Affect: Mood normal.        Behavior: Behavior normal.        Assessment/Plan: 1. 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 Working on diet and lifestyle modifications.  - Urine Microalbumin w/creat. ratio - POCT HgB A1C - insulin NPH Human (HUMULIN N) 100 UNIT/ML injection; Inject 0.2 mLs (20 Units total) into the skin 2 (two) times daily before a meal.  Dispense: 30 mL; Refill: 11 - insulin regular (HUMULIN R) 100 units/mL injection; Inject into skin before meals according to sliding scale. Max dose 48 units per 24 hours  Dispense: 10 mL; Refill: 11  2. Hypertension associated with diabetes ([place]) Continue benazepril as prescribed. Will monitor BP and adjust medications at next office visit if still  elevated. Patient is in a lot of dental pain right now. Looking for a dentist - benazepril (LOTENSIN) 10 MG tablet; Take 1 tablet (10 mg total) by mouth daily.  Dispense: 90 tablet; Refill: 1  3. Strain of neck muscle, initial encounter Muscle relaxant prescribed for muscle strain.  - cyclobenzaprine (FLEXERIL) 10 MG tablet; Take 1 tablet (10 mg total) by mouth 2 (two) times daily as needed for muscle spasms.  Dispense: 60 tablet; Refill: 3  4. Bilious vomiting with nausea Pantoprazole and metoclopramide refills ordered, continue as prescribed  - metoCLOPramide (REGLAN) 10 MG tablet; Take 1 tablet (10 mg total) by mouth 2 (two) times daily as needed for nausea or vomiting.  Dispense: 180 tablet; Refill: 1 - pantoprazole (PROTONIX) 40 MG tablet; Take 1 tablet (40 mg total) by mouth daily.  Dispense: 90 tablet; Refill: 1  5. Encounter for medication review Medication list reviewed with patient and updated - sodium zirconium cyclosilicate (LOKELMA) 10 g PACK packet; Take 10 g by mouth daily. - sodium bicarbonate 650 MG tablet; Take 650 mg by mouth 2 (two) times daily.   General Counseling: deaaron payant understanding of the findings of todays visit and agrees with plan of treatment. I have discussed any further diagnostic evaluation that may be needed or ordered today. We also reviewed his medications today. he has been encouraged to call the office with any questions or concerns that should arise related to todays visit.    Orders Placed This Encounter  Procedures   Urine Microalbumin w/creat. ratio   POCT HgB A1C    Meds ordered this encounter  Medications   insulin NPH Human (HUMULIN N) 100 UNIT/ML injection    Sig: Inject 0.2 mLs (20 Units total) into the skin 2 (two) times daily before a meal.    Dispense:  30 mL    Refill:  11    Dx code E11.65   insulin regular (HUMULIN R) 100 units/mL injection    Sig: Inject into skin  before meals according to sliding scale. Max dose 48  units per 24 hours    Dispense:  10 mL    Refill:  11    Dx code E11.65   benazepril (LOTENSIN) 10 MG tablet    Sig: Take 1 tablet (10 mg total) by mouth daily.    Dispense:  90 tablet    Refill:  1    Pt req 90 day supply, takes this med along with amlodipine.   cyclobenzaprine (FLEXERIL) 10 MG tablet    Sig: Take 1 tablet (10 mg total) by mouth 2 (two) times daily as needed for muscle spasms.    Dispense:  60 tablet    Refill:  3    Reorder, do not fill, patient will call pharmacy when he needs to refill this medication   metoCLOPramide (REGLAN) 10 MG tablet    Sig: Take 1 tablet (10 mg total) by mouth 2 (two) times daily as needed for nausea or vomiting.    Dispense:  180 tablet    Refill:  1   pantoprazole (PROTONIX) 40 MG tablet    Sig: Take 1 tablet (40 mg total) by mouth daily.    Dispense:  90 tablet    Refill:  1    Return in about 4 weeks (around 11/22/2022) for F/U, BP check, Rodell Marrs PCP.   Total time spent:30 Minutes Time spent includes review of chart, medications, test results, and follow up plan with the patient.   [place] Controlled Substance Database was reviewed by me.  This patient was seen by Jonetta Osgood, FNP-C in collaboration with Dr. Clayborn Bigness as a part of collaborative care agreement.   Eljay Lave R. Valetta Fuller, MSN, FNP-C Internal medicine

## 2022-11-06 ENCOUNTER — Encounter: Payer: Self-pay | Admitting: Nurse Practitioner

## 2022-11-29 ENCOUNTER — Ambulatory Visit (INDEPENDENT_AMBULATORY_CARE_PROVIDER_SITE_OTHER): Payer: Commercial Managed Care - HMO | Admitting: Nurse Practitioner

## 2022-11-29 ENCOUNTER — Encounter: Payer: Self-pay | Admitting: Nurse Practitioner

## 2022-11-29 VITALS — BP 170/80 | HR 84 | Temp 98.4°F | Resp 16 | Ht 68.0 in | Wt 183.6 lb

## 2022-11-29 DIAGNOSIS — I152 Hypertension secondary to endocrine disorders: Secondary | ICD-10-CM | POA: Diagnosis not present

## 2022-11-29 DIAGNOSIS — N184 Chronic kidney disease, stage 4 (severe): Secondary | ICD-10-CM

## 2022-11-29 DIAGNOSIS — E1159 Type 2 diabetes mellitus with other circulatory complications: Secondary | ICD-10-CM

## 2022-11-29 DIAGNOSIS — E1022 Type 1 diabetes mellitus with diabetic chronic kidney disease: Secondary | ICD-10-CM

## 2022-11-29 MED ORDER — BENAZEPRIL HCL 20 MG PO TABS: 20.0000 mg | ORAL_TABLET | Freq: Every day | ORAL | 1 refills | Status: DC

## 2022-11-29 NOTE — Patient Instructions (Signed)
For now, take 2 benazepril 10 mg tablets daily until you run out then start benazepril 20 mg tablet, 1 daily

## 2022-11-29 NOTE — Progress Notes (Signed)
[facility] [address] [place], [place] [address]  Internal MEDICINE  Office Visit Note  Patient Name: Jason Francis  [id number]  [id number]  Date of Service: 11/29/2022  Chief Complaint  Patient presents with   Diabetes   Gastroesophageal Reflux   Hypertension   Follow-up    HPI Jason Francis presents for a follow up visit for high blood pressure At his last visit, he was in significant pain from his teeth that need to be pulled.  He is not in any significant pain at this time today  Blood pressure remains elevated, even when rechecked.  Currently taking amlodipine 5 mg daily, carvedilol 6.25 mg twice daily, and benazepril 10 mg daily. Also reports glucose levels have been improving.     Current Medication: Outpatient Encounter Medications as of 11/29/2022  Medication Sig   amLODipine (NORVASC) 5 MG tablet Take 5 mg by mouth at bedtime.   [START ON 12/27/2022] benazepril (LOTENSIN) 20 MG tablet Take 1 tablet (20 mg total) by mouth daily.   calcitRIOL (ROCALTROL) 0.25 MCG capsule Take 0.25 mcg by mouth daily.   carvedilol (COREG) 6.25 MG tablet Take 6.25 mg by mouth 2 (two) times daily with a meal.   cyclobenzaprine (FLEXERIL) 10 MG tablet Take 1 tablet (10 mg total) by mouth 2 (two) times daily as needed for muscle spasms.   insulin NPH Human (HUMULIN N) 100 UNIT/ML injection Inject 0.2 mLs (20 Units total) into the skin 2 (two) times daily before a meal.   Insulin Pen Needle (PEN NEEDLES 31GX5/16") 31G X 8 MM MISC 1 each by Does not apply route as directed.   insulin regular (HUMULIN R) 100 units/mL injection Inject into skin before meals according to sliding scale. Max dose 48 units per 24 hours   methadone (DOLOPHINE) 1 MG/1ML solution Take 95 mg by mouth daily. Methadone clinic   metoCLOPramide (REGLAN) 10 MG tablet Take 1 tablet (10 mg total) by mouth 2 (two) times daily as needed for nausea or vomiting.   pantoprazole (PROTONIX) 40 MG tablet Take 1 tablet (40 mg  total) by mouth daily.   sodium bicarbonate 650 MG tablet Take 650 mg by mouth 2 (two) times daily.   sodium zirconium cyclosilicate (LOKELMA) 10 g PACK packet Take 10 g by mouth daily.   [DISCONTINUED] benazepril (LOTENSIN) 10 MG tablet Take 1 tablet (10 mg total) by mouth daily.   No facility-administered encounter medications on file as of 11/29/2022.    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  Vaping Use   Vaping Use: Former  Substance and Sexual Activity   Alcohol use: Not Currently   Drug use: Yes    Types: Marijuana, Cocaine    Comment: last stated cocaine use 07/27/22; marijuana daily   Sexual activity: Yes    Partners: Female  Other Topics Concern   Not on file  Social History Narrative   Not on file   Social Determinants of Health   Financial Resource Strain: Not on  file  Food Insecurity: Not on file  Transportation Needs: Not on file  Physical Activity: Not on file  Stress: Not on file  Social Connections: Not on file  Intimate Partner Violence: Not on file      Review of Systems  Constitutional:  Negative for chills, fatigue and unexpected weight change.  HENT:  Positive for dental problem and postnasal drip. Negative for congestion, rhinorrhea, sneezing and sore throat.   Eyes:  Negative for redness.  Respiratory: Negative.  Negative for cough, chest tightness, shortness of breath and wheezing.   Cardiovascular: Negative.  Negative for chest pain and palpitations.  Gastrointestinal:  Negative for abdominal pain, constipation, diarrhea, nausea and vomiting.  Genitourinary:  Negative for dysuria and frequency.  Musculoskeletal:  Positive for arthralgias (Left neck pain), gait problem (Due to Charcot's joint of the left ankle, ambulates with crutches) and neck pain (Left side). Negative for back pain and joint swelling.  Skin: Negative.  Negative for rash.  Neurological:  Negative for tremors and numbness.  Hematological:  Negative for adenopathy. Does not bruise/bleed easily.  Psychiatric/Behavioral:  Negative for behavioral problems (Depression), sleep disturbance and suicidal ideas. The patient is not nervous/anxious.     Vital Signs: BP (!) 170/80 Comment: 182/98  Pulse 84   Temp 98.4 F (36.9 C)   Resp 16   Ht 5\' 8"  (1.727 m)   Wt 183 lb 9.6 oz (83.3 kg)   SpO2 96%   BMI 27.92 kg/m    Physical Exam Vitals reviewed.  Constitutional:      General: He is not in acute distress.    Appearance: Normal appearance. He is not ill-appearing.  HENT:     Head: Normocephalic and atraumatic.     Mouth/Throat:     Dentition: Abnormal dentition. Dental tenderness, gingival swelling and dental caries present.  Eyes:     Pupils: Pupils are equal, round, and reactive to light.  Cardiovascular:     Rate and Rhythm: Normal rate and regular  rhythm.  Pulmonary:     Effort: Pulmonary effort is normal. No respiratory distress.  Neurological:     Mental Status: He is alert and oriented to person, place, and time.     Gait: Gait abnormal (Due to Charcot's joint of the left ankle).  Psychiatric:        Mood and Affect: Mood normal.        Behavior: Behavior normal.        Assessment/Plan: 1. Hypertension associated with diabetes Benazepril dose increased. Follow up in 4 weeks to reassess blood pressure. He will also check his BP daily at  home and keep a record of it.  - benazepril (LOTENSIN) 20 MG tablet; Take 1 tablet (20 mg total) by mouth daily.  Dispense: 90 tablet; Refill: 1  2. Type 1 diabetes mellitus with stage 4 chronic kidney disease Glucose readings are improving per patient.    General Counseling: natividad halls understanding of the findings of todays visit and agrees with plan of treatment. I have discussed any further diagnostic evaluation that may be needed or ordered today. We also reviewed his medications today. he has been encouraged to call the office with any questions or concerns that should arise related to todays visit.    No orders of the defined types were placed in this encounter.   Meds ordered this encounter  Medications   benazepril (LOTENSIN) 20 MG tablet    Sig: Take 1 tablet (20 mg total) by mouth daily.    Dispense:  90 tablet    Refill:  1    Note increased dose, please fill new script in about 4 weeks.    Return in about 4 weeks (around 12/27/2022) for F/U, BP check, Vladislav Axelson PCP.   Total time spent:20 Minutes Time spent includes review of chart, medications, test results, and follow up plan with the patient.   [place] Controlled Substance Database was reviewed by me.  This patient was seen by Sallyanne Kuster, FNP-C in collaboration with Dr. Beverely Risen as a part of collaborative care agreement.   Marlee Trentman R. Tedd Sias, MSN, FNP-C Internal medicine

## 2022-11-30 ENCOUNTER — Encounter: Payer: Self-pay | Admitting: Nurse Practitioner

## 2022-12-27 ENCOUNTER — Ambulatory Visit: Payer: Commercial Managed Care - HMO | Admitting: Nurse Practitioner

## 2022-12-29 ENCOUNTER — Encounter: Payer: Self-pay | Admitting: Nurse Practitioner

## 2022-12-29 ENCOUNTER — Ambulatory Visit (INDEPENDENT_AMBULATORY_CARE_PROVIDER_SITE_OTHER): Payer: Commercial Managed Care - HMO | Admitting: Nurse Practitioner

## 2022-12-29 VITALS — BP 150/72 | HR 74 | Temp 98.9°F | Resp 16 | Ht 68.0 in | Wt 187.0 lb

## 2022-12-29 DIAGNOSIS — N184 Chronic kidney disease, stage 4 (severe): Secondary | ICD-10-CM

## 2022-12-29 DIAGNOSIS — M14672 Charcot's joint, left ankle and foot: Secondary | ICD-10-CM

## 2022-12-29 DIAGNOSIS — I152 Hypertension secondary to endocrine disorders: Secondary | ICD-10-CM | POA: Diagnosis not present

## 2022-12-29 DIAGNOSIS — E1022 Type 1 diabetes mellitus with diabetic chronic kidney disease: Secondary | ICD-10-CM | POA: Diagnosis not present

## 2022-12-29 DIAGNOSIS — E1159 Type 2 diabetes mellitus with other circulatory complications: Secondary | ICD-10-CM | POA: Diagnosis not present

## 2022-12-29 NOTE — Progress Notes (Signed)
[facility] [address] [place], [place] [address]  Internal MEDICINE  Office Visit Note  Patient Name: Jason Francis  [id number]  [id number]  Date of Service: 12/29/2022  Chief Complaint  Patient presents with   Diabetes   Gastroesophageal Reflux   Hypertension   Follow-up    HPI Jason Francis presents for a follow-up visit for hypertension  BP is improving but still slightly elevated. Paperwork to be finished this week. For DMV    Current Medication: Outpatient Encounter Medications as of 12/29/2022  Medication Sig   amLODipine (NORVASC) 5 MG tablet Take 5 mg by mouth at bedtime.   benazepril (LOTENSIN) 20 MG tablet Take 1 tablet (20 mg total) by mouth daily.   calcitRIOL (ROCALTROL) 0.25 MCG capsule Take 0.25 mcg by mouth daily.   carvedilol (COREG) 6.25 MG tablet Take 6.25 mg by mouth 2 (two) times daily with a meal.   cyclobenzaprine (FLEXERIL) 10 MG tablet Take 1 tablet (10 mg total) by mouth 2 (two) times daily as needed for muscle spasms.   insulin NPH Human (HUMULIN N) 100 UNIT/ML injection Inject 0.2 mLs (20 Units total) into the skin 2 (two) times daily before a meal.   Insulin Pen Needle (PEN NEEDLES 31GX5/16") 31G X 8 MM MISC 1 each by Does not apply route as directed.   insulin regular (HUMULIN R) 100 units/mL injection Inject into skin before meals according to sliding scale. Max dose 48 units per 24 hours   methadone (DOLOPHINE) 1 MG/1ML solution Take 95 mg by mouth daily. Methadone clinic   metoCLOPramide (REGLAN) 10 MG tablet Take 1 tablet (10 mg total) by mouth 2 (two) times daily as needed for nausea or vomiting.   pantoprazole (PROTONIX) 40 MG tablet Take 1 tablet (40 mg total) by mouth daily.   sodium bicarbonate 650 MG tablet Take 650 mg by mouth 2 (two) times daily.   sodium zirconium cyclosilicate (LOKELMA) 10 g PACK packet Take 10 g by mouth daily.   No facility-administered encounter medications on file as of 12/29/2022.    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  Vaping Use   Vaping Use: Former  Substance and Sexual Activity   Alcohol use: Not Currently   Drug use: Yes    Types: Marijuana, Cocaine    Comment: last stated cocaine use 07/27/22; marijuana daily   Sexual activity: Yes    Partners: Female  Other Topics Concern   Not on file  Social History Narrative   Not on file   Social Determinants of Health   Financial Resource Strain: Not on file  Food Insecurity: Not on file  Transportation Needs: Not on file  Physical Activity: Not on file  Stress: Not on file  Social Connections: Not on file  Intimate Partner Violence: Not on file      Review of Systems  Constitutional:  Negative for chills, fatigue and unexpected weight change.  HENT:  Positive for dental  problem and postnasal drip. Negative for congestion, rhinorrhea, sneezing and sore throat.   Eyes:  Negative for redness.  Respiratory: Negative.  Negative for cough, chest tightness, shortness of breath and wheezing.   Cardiovascular: Negative.  Negative for chest pain and palpitations.  Gastrointestinal:  Negative for abdominal pain, constipation, diarrhea, nausea and vomiting.  Genitourinary:  Negative for dysuria and frequency.  Musculoskeletal:  Positive for arthralgias (Left neck pain), gait problem (Due to Charcot's joint of the left ankle, ambulates with crutches) and neck pain (Left side). Negative for back pain and joint swelling.  Skin: Negative.  Negative for rash.  Neurological:  Negative for tremors and numbness.  Hematological:  Negative for adenopathy. Does not bruise/bleed easily.  Psychiatric/Behavioral:  Negative for behavioral problems (Depression), sleep disturbance and suicidal ideas. The patient is not nervous/anxious.     Vital Signs: BP (!) 150/72 Comment: 164/73  Pulse 74   Temp 98.9 F (37.2 C)   Resp 16   Ht 5\' 8"  (1.727 m)   Wt 187 lb (84.8 kg)   SpO2 99%   BMI 28.43 kg/m    Physical Exam Vitals reviewed.  Constitutional:      General: He is not in acute distress.    Appearance: Normal appearance. He is not ill-appearing.  HENT:     Head: Normocephalic and atraumatic.     Mouth/Throat:     Dentition: Abnormal dentition. Dental tenderness, gingival swelling and dental caries present.  Eyes:     Pupils: Pupils are equal, round, and reactive to light.  Cardiovascular:     Rate and Rhythm: Normal rate and regular rhythm.  Pulmonary:     Effort: Pulmonary effort is normal. No respiratory distress.  Neurological:     Mental Status: He is alert and oriented to person, place, and time.     Gait: Gait abnormal (Due to Charcot's joint of the left ankle).  Psychiatric:        Mood and Affect: Mood normal.        Behavior: Behavior normal.         Assessment/Plan: 1. Hypertension associated with diabetes (HCC) BP improving, repeat in 2 months  2. 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 Charcot's joint of left ankle Still waiting for surgery   General Counseling: jupiter kabir understanding of the findings of todays visit and agrees with plan of treatment. I have discussed any further diagnostic evaluation that may be needed or ordered today. We also reviewed his medications today. he has been encouraged to call the office with any questions or  concerns that should arise related to todays visit.    No orders of the defined types were placed in this encounter.   No orders of the defined types were placed in this encounter.   Return in about 2 months (around 02/28/2023) for F/U, Recheck A1C, Ermal Haberer PCP, BP check.   Total time spent:30 Minutes Time spent includes review of chart, medications, test results, and follow up plan with the patient.   [place] Controlled Substance Database was reviewed by me.  This patient was seen by Sallyanne Kuster, FNP-C in collaboration with Dr. Beverely Risen as a part of collaborative care agreement.   Joaquina Nissen R. Tedd Sias, MSN, FNP-C Internal medicine

## 2023-01-14 ENCOUNTER — Telehealth: Payer: Self-pay | Admitting: Nurse Practitioner

## 2023-01-14 ENCOUNTER — Other Ambulatory Visit: Payer: Self-pay | Admitting: Nurse Practitioner

## 2023-01-14 MED ORDER — INSULIN NPH (HUMAN) (ISOPHANE) 100 UNIT/ML ~~LOC~~ SUSP: 20.0000 [IU] | Freq: Two times a day (BID) | SUBCUTANEOUS | 11 refills | Status: DC

## 2023-01-14 NOTE — Telephone Encounter (Signed)
DMV paperwork completed. Faxed; [phone]. Scanned-Toni

## 2023-01-17 ENCOUNTER — Inpatient Hospital Stay: Payer: Commercial Managed Care - HMO | Admitting: Internal Medicine

## 2023-01-17 ENCOUNTER — Inpatient Hospital Stay: Payer: Commercial Managed Care - HMO

## 2023-01-19 ENCOUNTER — Other Ambulatory Visit: Payer: Self-pay

## 2023-01-21 ENCOUNTER — Encounter: Payer: Self-pay | Admitting: Internal Medicine

## 2023-01-21 ENCOUNTER — Inpatient Hospital Stay: Payer: Commercial Managed Care - HMO

## 2023-01-21 ENCOUNTER — Inpatient Hospital Stay: Payer: Commercial Managed Care - HMO | Attending: Internal Medicine | Admitting: Internal Medicine

## 2023-01-21 VITALS — BP 166/95 | HR 92 | Temp 96.4°F | Ht 68.0 in | Wt 189.7 lb

## 2023-01-21 DIAGNOSIS — I739 Peripheral vascular disease, unspecified: Secondary | ICD-10-CM | POA: Insufficient documentation

## 2023-01-21 DIAGNOSIS — I129 Hypertensive chronic kidney disease with stage 1 through stage 4 chronic kidney disease, or unspecified chronic kidney disease: Secondary | ICD-10-CM | POA: Diagnosis present

## 2023-01-21 DIAGNOSIS — F1490 Cocaine use, unspecified, uncomplicated: Secondary | ICD-10-CM | POA: Insufficient documentation

## 2023-01-21 DIAGNOSIS — N184 Chronic kidney disease, stage 4 (severe): Secondary | ICD-10-CM | POA: Insufficient documentation

## 2023-01-21 DIAGNOSIS — D631 Anemia in chronic kidney disease: Secondary | ICD-10-CM

## 2023-01-21 DIAGNOSIS — F17210 Nicotine dependence, cigarettes, uncomplicated: Secondary | ICD-10-CM | POA: Insufficient documentation

## 2023-01-21 DIAGNOSIS — Z79899 Other long term (current) drug therapy: Secondary | ICD-10-CM | POA: Insufficient documentation

## 2023-01-21 DIAGNOSIS — Z794 Long term (current) use of insulin: Secondary | ICD-10-CM | POA: Diagnosis not present

## 2023-01-21 DIAGNOSIS — F1290 Cannabis use, unspecified, uncomplicated: Secondary | ICD-10-CM | POA: Diagnosis not present

## 2023-01-21 DIAGNOSIS — E1022 Type 1 diabetes mellitus with diabetic chronic kidney disease: Secondary | ICD-10-CM | POA: Diagnosis not present

## 2023-01-21 LAB — COMPREHENSIVE METABOLIC PANEL
ALT: 37 U/L (ref 0–44)
AST: 31 U/L (ref 15–41)
Albumin: 3.5 g/dL (ref 3.5–5.0)
Alkaline Phosphatase: 94 U/L (ref 38–126)
Anion gap: 11 (ref 5–15)
BUN: 78 mg/dL — ABNORMAL HIGH (ref 6–20)
CO2: 21 mmol/L — ABNORMAL LOW (ref 22–32)
Calcium: 8.8 mg/dL — ABNORMAL LOW (ref 8.9–10.3)
Chloride: 99 mmol/L (ref 98–111)
Creatinine, Ser: 4.63 mg/dL — ABNORMAL HIGH (ref 0.61–1.24)
GFR, Estimated: 16 mL/min — ABNORMAL LOW (ref >60)
Glucose, Bld: 175 mg/dL — ABNORMAL HIGH (ref 70–99)
Potassium: 5.3 mmol/L — ABNORMAL HIGH (ref 3.5–5.1)
Sodium: 131 mmol/L — ABNORMAL LOW (ref 135–145)
Total Bilirubin: <0.1 mg/dL — ABNORMAL LOW (ref 0.3–1.2)
Total Protein: 9.8 g/dL — ABNORMAL HIGH (ref 6.5–8.1)

## 2023-01-21 LAB — CBC WITH DIFFERENTIAL/PLATELET
Abs Immature Granulocytes: 0.04 10*3/uL (ref 0.00–0.07)
Basophils Absolute: 0 10*3/uL (ref 0.0–0.1)
Basophils Relative: 0 %
Eosinophils Absolute: 0.5 10*3/uL (ref 0.0–0.5)
Eosinophils Relative: 5 %
HCT: 28.9 % — ABNORMAL LOW (ref 39.0–52.0)
Hemoglobin: 9 g/dL — ABNORMAL LOW (ref 13.0–17.0)
Immature Granulocytes: 0 %
Lymphocytes Relative: 24 %
Lymphs Abs: 2.3 10*3/uL (ref 0.7–4.0)
MCH: 27.2 pg (ref 26.0–34.0)
MCHC: 31.1 g/dL (ref 30.0–36.0)
MCV: 87.3 fL (ref 80.0–100.0)
Monocytes Absolute: 0.6 10*3/uL (ref 0.1–1.0)
Monocytes Relative: 6 %
Neutro Abs: 6.1 10*3/uL (ref 1.7–7.7)
Neutrophils Relative %: 65 %
Platelets: 365 10*3/uL (ref 150–400)
RBC: 3.31 MIL/uL — ABNORMAL LOW (ref 4.22–5.81)
RDW: 13.2 % (ref 11.5–15.5)
WBC: 9.6 10*3/uL (ref 4.0–10.5)
nRBC: 0 % (ref 0.0–0.2)

## 2023-01-21 LAB — TECHNOLOGIST SMEAR REVIEW

## 2023-01-21 LAB — RETICULOCYTES
Immature Retic Fract: 8.9 % (ref 2.3–15.9)
RBC.: 3.27 MIL/uL — ABNORMAL LOW (ref 4.22–5.81)
Retic Count, Absolute: 40.5 10*3/uL (ref 19.0–186.0)
Retic Ct Pct: 1.2 % (ref 0.4–3.1)

## 2023-01-21 LAB — VITAMIN B12: Vitamin B-12: 375 pg/mL (ref 180–914)

## 2023-01-21 LAB — IRON AND TIBC
Iron: 38 ug/dL — ABNORMAL LOW (ref 45–182)
Saturation Ratios: 12 % — ABNORMAL LOW (ref 17.9–39.5)
TIBC: 307 ug/dL (ref 250–450)
UIBC: 269 ug/dL

## 2023-01-21 LAB — FERRITIN: Ferritin: 80 ng/mL (ref 24–336)

## 2023-01-21 LAB — LACTATE DEHYDROGENASE: LDH: 108 U/L (ref 98–192)

## 2023-01-21 NOTE — Assessment & Plan Note (Addendum)
#   Chronic anemia-normocytic recently getting worse.  Patient is mildy symptomatic from anemia.  Etiology likely from chronic kidney disease.  # I discussed at length the pathophysiology of anemia from chronic kidney disease which includes decreased erythropoietin production; and decreased iron stores in the body.    # I discussed that I would recommend using iron infusion/Venofer; along with Retacrit to maintain hemoglobin around 10.  I discussed the potential acute infusion reactions with IV iron; which are quite rare.  Patient understands the risk; will proceed with infusions.    # I also discussed the role of Retacrit boosting the hemoglobin.  However the goal hemoglobin less than 10 as a increased risk of thromboembolic events in the target hemoglobin is around 12 to 13.   #I recommend CBC CMP LDH peripheral smear; haptoglobin; erythropoietin; iron studies ferritin B12 folic acid; reticulocyte count; myeloma panel kappa lambda light chain ratio.    # IDDM- [32 years old]- on Insulin  [PCP]; HbA1c- 7.0-monitor closely.  # History of cocaine/marijuana -currently no IVDA-monitor closely for elevated blood pressures.  # PVD/ Left foot s/p Dr.Dew evaluation- awaiting medical optimization holding off surgery.   Thank you Dr. Cherylann Ratel for allowing me to participate in the care of your pleasant patient. Please do not hesitate to contact me with questions or concerns in the interim.  # DISPOSITION: # labs today-  # follow up 2 weeks- MD; no labs- possible venofer or retacrit - Dr.B

## 2023-01-21 NOTE — Progress Notes (Signed)
[facility] CONSULT NOTE  Patient Care Team: Sallyanne Kuster, NP as PCP - General (Nurse Practitioner)  CHIEF COMPLAINTS/PURPOSE OF CONSULTATION: ANEMIA   HEMATOLOGY HISTORY  # ANEMIA[Hb; MCV-platelets- WBC; Iron sat; ferritin;  GFR- CT/US; EGD/colonoscopy-  # CKD-IV [Dr.Lateef]  HISTORY OF PRESENTING ILLNESS: Patient ambulating-with assistance/crutches.  Accompanied by fiance.   Jason Francis 32 y.o.  male pleasant patient with multiple medical problems including chronic kidney disease stage IV/insulin-dependent diabetes; history of hep C/history of IVDA" was evaluation of anemia.  Patient complains of mild fatigue otherwise no significant shortness of breath or cough.  Denies any blood in stools or black or stools.   Patient states that he does not do any IV drug abuse at this time.  However pt states currently does cocaine and marijuana occasionally.   C/o needing lt foot amputated   Review of Systems  Constitutional:  Positive for malaise/fatigue. Negative for chills, diaphoresis, fever and weight loss.  HENT:  Negative for nosebleeds and sore throat.   Eyes:  Negative for double vision.  Respiratory:  Negative for cough, hemoptysis, sputum production, shortness of breath and wheezing.   Cardiovascular:  Negative for chest pain, palpitations, orthopnea and leg swelling.  Gastrointestinal:  Negative for abdominal pain, blood in stool, constipation, diarrhea, heartburn, melena, nausea and vomiting.  Genitourinary:  Negative for dysuria, frequency and urgency.  Musculoskeletal:  Positive for back pain and joint pain.  Skin: Negative.  Negative for itching and rash.  Neurological:  Negative for dizziness, tingling, focal weakness, weakness and headaches.  Endo/Heme/Allergies:  Does not bruise/bleed easily.  Psychiatric/Behavioral:  Negative for depression. The patient is not nervous/anxious and does not have insomnia.      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  Vaping Use   Vaping Use: Former  Substance and Sexual Activity   Alcohol use: Not Currently   Drug use: Yes    Types: Marijuana, Cocaine    Comment: last stated cocaine use 07/27/22; marijuana daily   Sexual activity: Yes    Partners: Female  Other Topics Concern   Not on file  Social History Narrative   Not on file   Social Determinants of Health   Financial Resource Strain: Not on file  Food Insecurity: No Food  Insecurity (01/21/2023)   Hunger Vital Sign    Worried About Running Out of Food in the Last Year: Never true    Ran Out of Food in the Last Year: Never true  Transportation Needs: No Transportation Needs (01/21/2023)   PRAPARE - Administrator, Civil Service (Medical): No    Lack of Transportation (Non-Medical): No  Physical Activity: Not on file  Stress: Not on file  Social Connections: Not on file  Intimate Partner Violence: Not At Risk (01/21/2023)    Humiliation, Afraid, Rape, and Kick questionnaire    Fear of Current or Ex-Partner: No    Emotionally Abused: No    Physically Abused: No    Sexually Abused: No    FAMILY HISTORY: Family History  Problem Relation Age of Onset   Heart disease Mother    Hypertension Mother    Obesity Mother    Gout Mother    Thyroid disease Mother    High blood pressure Maternal Grandmother    Dementia Maternal Grandmother    Heart disease Maternal Grandfather     ALLERGIES:  is allergic to augmentin [amoxicillin-pot clavulanate] and sulfa antibiotics.  MEDICATIONS:  Current Outpatient Medications  Medication Sig Dispense Refill   amLODipine (NORVASC) 5 MG tablet Take 5 mg by mouth at bedtime.     benazepril (LOTENSIN) 20 MG tablet Take 1 tablet (20 mg total) by mouth daily. 90 tablet 1   calcitRIOL (ROCALTROL) 0.25 MCG capsule Take 0.25 mcg by mouth daily.     carvedilol (COREG) 6.25 MG tablet Take 6.25 mg by mouth 2 (two) times daily with a meal.     cyclobenzaprine (FLEXERIL) 10 MG tablet Take 1 tablet (10 mg total) by mouth 2 (two) times daily as needed for muscle spasms. 60 tablet 3   insulin NPH Human (NOVOLIN N) 100 UNIT/ML injection Inject 0.2 mLs (20 Units total) into the skin 2 (two) times daily before a meal. 10 mL 11   Insulin Pen Needle (PEN NEEDLES 31GX5/16") 31G X 8 MM MISC 1 each by Does not apply route as directed. 100 each 1   insulin regular (HUMULIN R) 100 units/mL injection Inject into skin before meals according to sliding scale. Max dose 48 units per 24 hours 10 mL 11   methadone (DOLOPHINE) 1 MG/1ML solution Take 95 mg by mouth daily. Methadone clinic     metoCLOPramide (REGLAN) 10 MG tablet Take 1 tablet (10 mg total) by mouth 2 (two) times daily as needed for nausea or vomiting. 180 tablet 1   pantoprazole (PROTONIX) 40 MG tablet Take 1 tablet (40 mg total) by mouth daily. 90 tablet 1   sodium bicarbonate 650 MG tablet Take 650 mg by mouth 2 (two) times daily.     sodium  zirconium cyclosilicate (LOKELMA) 10 g PACK packet Take 10 g by mouth daily.     No current facility-administered medications for this visit.     Marland Kitchen  PHYSICAL EXAMINATION:   Vitals:   01/21/23 1344  BP: (!) 166/95  Pulse: 92  Temp: (!) 96.4 F (35.8 C)  SpO2: 100%   Filed Weights   01/21/23 1344  Weight: 189 lb 11.2 oz (86 kg)    Physical Exam Vitals and nursing note reviewed.  HENT:     Head: Normocephalic and atraumatic.     Mouth/Throat:     Pharynx: Oropharynx is clear.  Eyes:     Extraocular Movements: Extraocular movements intact.     Pupils: Pupils are equal,  round, and reactive to light.  Cardiovascular:     Rate and Rhythm: Normal rate and regular rhythm.  Pulmonary:     Comments: Decreased breath sounds bilaterally.  Abdominal:     Palpations: Abdomen is soft.  Musculoskeletal:        General: Normal range of motion.     Cervical back: Normal range of motion.  Skin:    General: Skin is warm.  Neurological:     General: No focal deficit present.     Mental Status: He is alert and oriented to person, place, and time.  Psychiatric:        Behavior: Behavior normal.        Judgment: Judgment normal.      LABORATORY DATA:  I have reviewed the data as listed Lab Results  Component Value Date   WBC 11.4 (H) 08/03/2022   HGB 11.2 (L) 08/05/2022   HCT 33.0 (L) 08/05/2022   MCV 88.6 08/03/2022   PLT 316 08/03/2022   Recent Labs    02/14/22 1102 02/15/22 0429 02/17/22 0408 07/02/22 0958 08/03/22 1001 08/05/22 1126  NA 131*   < > 134* 135 134* 135  K 5.4*   < > 4.2 5.9* 5.2* 5.5*  CL 100   < > 107 107 107 108  CO2 19*   < > 21* 20* 18*  --   GLUCOSE 375*   < > 206* 135* 204* 139*  BUN 40*   < > 43* 54* 61* 59*  CREATININE 3.74*   < > 3.56* 3.63* 3.72* 4.80*  CALCIUM 8.9   < > 8.4* 9.2 9.2  --   GFRNONAA 21*   < > 22* 22* 21*  --   PROT 9.9*  --   --   --   --   --   ALBUMIN 3.2*  --   --   --   --   --   AST 32  --   --   --   --   --    ALT 24  --   --   --   --   --   ALKPHOS 132*  --   --   --   --   --   BILITOT 0.7  --   --   --   --   --    < > = values in this interval not displayed.     No results found.  ASSESSMENT & PLAN:   Anemia of chronic kidney failure, stage 4 (severe) (HCC) # Chronic anemia-normocytic recently getting worse.  Patient is mildy symptomatic from anemia.  Etiology likely from chronic kidney disease.  # I discussed at length the pathophysiology of anemia from chronic kidney disease which includes decreased erythropoietin production; and decreased iron stores in the body.    # I discussed that I would recommend using iron infusion/Venofer; along with Retacrit to maintain hemoglobin around 10.  I discussed the potential acute infusion reactions with IV iron; which are quite rare.  Patient understands the risk; will proceed with infusions.    # I also discussed the role of Retacrit boosting the hemoglobin.  However the goal hemoglobin less than 10 as a increased risk of thromboembolic events in the target hemoglobin is around 12 to 13.   #I recommend CBC CMP LDH peripheral smear; haptoglobin; erythropoietin; iron studies ferritin B12 folic acid; reticulocyte count; myeloma panel kappa lambda light chain ratio.    # IDDM- [32 years old]-  on Insulin  [PCP]; HbA1c- 7.0-monitor closely.  # History of cocaine/marijuana -currently no IVDA-monitor closely for elevated blood pressures.  # PVD/ Left foot s/p Dr.Dew evaluation- awaiting medical optimization holding off surgery.   Thank you Dr. Cherylann Ratel for allowing me to participate in the care of your pleasant patient. Please do not hesitate to contact me with questions or concerns in the interim.  # DISPOSITION: # labs today-  # follow up 2 weeks- MD; no labs- possible venofer or retacrit - Dr.B  All questions were answered. The patient knows to call the clinic with any problems, questions or concerns.   Earna Coder, MD 01/21/2023 3:13  PM

## 2023-01-21 NOTE — Progress Notes (Signed)
Fatigue/weakness: no Dyspena: no Light headedness: no Blood in stool: no   Pt states currently does cocaine and marijuana occasionally.  C/o needing lt foot amputated.

## 2023-01-25 LAB — KAPPA/LAMBDA LIGHT CHAINS
Kappa free light chain: 282.6 mg/L — ABNORMAL HIGH (ref 3.3–19.4)
Kappa, lambda light chain ratio: 1.45 (ref 0.26–1.65)
Lambda free light chains: 194.6 mg/L — ABNORMAL HIGH (ref 5.7–26.3)

## 2023-01-25 LAB — ERYTHROPOIETIN: Erythropoietin: 6.3 m[IU]/mL (ref 2.6–18.5)

## 2023-02-01 LAB — MULTIPLE MYELOMA PANEL, SERUM
Albumin SerPl Elph-Mcnc: 3.2 g/dL (ref 2.9–4.4)
Albumin/Glob SerPl: 0.6 — ABNORMAL LOW (ref 0.7–1.7)
Alpha 1: 0.4 g/dL (ref 0.0–0.4)
Alpha2 Glob SerPl Elph-Mcnc: 1.2 g/dL — ABNORMAL HIGH (ref 0.4–1.0)
B-Globulin SerPl Elph-Mcnc: 1.4 g/dL — ABNORMAL HIGH (ref 0.7–1.3)
Gamma Glob SerPl Elph-Mcnc: 3.1 g/dL — ABNORMAL HIGH (ref 0.4–1.8)
Globulin, Total: 6.1 g/dL — ABNORMAL HIGH (ref 2.2–3.9)
IgA: 621 mg/dL — ABNORMAL HIGH (ref 90–386)
IgG (Immunoglobin G), Serum: 3479 mg/dL — ABNORMAL HIGH (ref 603–1613)
IgM (Immunoglobulin M), Srm: 235 mg/dL — ABNORMAL HIGH (ref 20–172)
Total Protein ELP: 9.3 g/dL — ABNORMAL HIGH (ref 6.0–8.5)

## 2023-02-09 ENCOUNTER — Telehealth: Payer: Self-pay

## 2023-02-09 ENCOUNTER — Other Ambulatory Visit: Payer: Self-pay

## 2023-02-09 ENCOUNTER — Telehealth: Payer: Self-pay | Admitting: *Deleted

## 2023-02-09 DIAGNOSIS — D631 Anemia in chronic kidney disease: Secondary | ICD-10-CM

## 2023-02-09 MED ORDER — EPOETIN ALFA-EPBX 20000 UNIT/ML IJ SOLN: 20000.0000 [IU] | INTRAMUSCULAR | Status: DC

## 2023-02-09 NOTE — Telephone Encounter (Signed)
Nurse with [facility] called and said that she spoke with patient re setting up injections for Retacrit and he was upset starting that he know nothing of this and wants a call she also needs a return call with nursing orders.

## 2023-02-09 NOTE — Telephone Encounter (Signed)
Spoke to [facility] [phone]. Case# [id number] . Epoetin orders entered in Epic per request

## 2023-02-10 NOTE — Telephone Encounter (Signed)
Jason Francis is working on this

## 2023-02-14 MED FILL — Iron Sucrose Inj 20 MG/ML (Fe Equiv): INTRAVENOUS | Qty: 10 | Status: AC

## 2023-02-15 ENCOUNTER — Inpatient Hospital Stay: Payer: Commercial Managed Care - HMO | Attending: Internal Medicine | Admitting: Internal Medicine

## 2023-02-15 ENCOUNTER — Inpatient Hospital Stay: Payer: Commercial Managed Care - HMO

## 2023-02-15 ENCOUNTER — Encounter: Payer: Self-pay | Admitting: Internal Medicine

## 2023-02-15 VITALS — BP 136/74 | HR 89 | Temp 96.9°F | Ht 68.0 in | Wt 188.7 lb

## 2023-02-15 DIAGNOSIS — F1190 Opioid use, unspecified, uncomplicated: Secondary | ICD-10-CM

## 2023-02-15 DIAGNOSIS — F17210 Nicotine dependence, cigarettes, uncomplicated: Secondary | ICD-10-CM | POA: Diagnosis not present

## 2023-02-15 DIAGNOSIS — Z79899 Other long term (current) drug therapy: Secondary | ICD-10-CM | POA: Diagnosis not present

## 2023-02-15 DIAGNOSIS — Z794 Long term (current) use of insulin: Secondary | ICD-10-CM | POA: Insufficient documentation

## 2023-02-15 DIAGNOSIS — I129 Hypertensive chronic kidney disease with stage 1 through stage 4 chronic kidney disease, or unspecified chronic kidney disease: Secondary | ICD-10-CM | POA: Insufficient documentation

## 2023-02-15 DIAGNOSIS — N184 Chronic kidney disease, stage 4 (severe): Secondary | ICD-10-CM | POA: Diagnosis present

## 2023-02-15 DIAGNOSIS — E1022 Type 1 diabetes mellitus with diabetic chronic kidney disease: Secondary | ICD-10-CM | POA: Diagnosis not present

## 2023-02-15 DIAGNOSIS — D631 Anemia in chronic kidney disease: Secondary | ICD-10-CM | POA: Diagnosis present

## 2023-02-15 NOTE — Progress Notes (Unsigned)
[facility] CONSULT NOTE  Patient Care Team: Sallyanne Kuster, NP as PCP - General (Nurse Practitioner)  CHIEF COMPLAINTS/PURPOSE OF CONSULTATION: ANEMIA   HEMATOLOGY HISTORY  # ANEMIA[Hb; MCV-platelets- WBC; Iron sat; ferritin;  GFR- CT/US; EGD/colonoscopy-  # CKD-IV [Dr.Lateef]  HISTORY OF PRESENTING ILLNESS: Patient ambulating-with assistance/crutches.  Accompanied by fiance.   Jason Francis 32 y.o.  male pleasant patient with multiple medical problems including chronic kidney disease stage IV/insulin-dependent diabetes; history of hep C/history of IVDA"-today for follow-up.  Denies any blood in stools or black or stools.  However pt states currently does cocaine and marijuana occasionally.  Last cocaine use approximately 5 days ago.   C/o needing lt foot amputated.    Review of Systems  Constitutional:  Positive for malaise/fatigue. Negative for chills, diaphoresis, fever and weight loss.  HENT:  Negative for nosebleeds and sore throat.   Eyes:  Negative for double vision.  Respiratory:  Negative for cough, hemoptysis, sputum production, shortness of breath and wheezing.   Cardiovascular:  Negative for chest pain, palpitations, orthopnea and leg swelling.  Gastrointestinal:  Negative for abdominal pain, blood in stool, constipation, diarrhea, heartburn, melena, nausea and vomiting.  Genitourinary:  Negative for dysuria, frequency and urgency.  Musculoskeletal:  Positive for back pain and joint pain.  Skin: Negative.  Negative for itching and rash.  Neurological:  Negative for dizziness, tingling, focal weakness, weakness and headaches.  Endo/Heme/Allergies:  Does not bruise/bleed easily.  Psychiatric/Behavioral:  Negative for depression. The patient is not nervous/anxious and does not have insomnia.      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  Vaping Use   Vaping Use: Former  Substance and Sexual Activity   Alcohol use: Not Currently   Drug use: Yes    Types: Marijuana, Cocaine    Comment: last stated cocaine use 07/27/22; marijuana daily   Sexual activity: Yes    Partners: Female  Other Topics Concern   Not on file  Social History Narrative   Not on file   Social Determinants of Health   Financial Resource Strain: Not on file  Food Insecurity: No Food Insecurity (01/21/2023)   Hunger Vital Sign    Worried About Running Out of Food in the Last Year: Never true  Ran Out of Food in the Last Year: Never true  Transportation Needs: No Transportation Needs (01/21/2023)   PRAPARE - Administrator, Civil Service (Medical): No    Lack of Transportation (Non-Medical): No  Physical Activity: Not on file  Stress: Not on file  Social Connections: Not on file  Intimate Partner Violence: Not At Risk (01/21/2023)   Humiliation, Afraid, Rape, and Kick questionnaire    Fear of Current or Ex-Partner: No    Emotionally Abused: No    Physically Abused:  No    Sexually Abused: No    FAMILY HISTORY: Family History  Problem Relation Age of Onset   Heart disease Mother    Hypertension Mother    Obesity Mother    Gout Mother    Thyroid disease Mother    High blood pressure Maternal Grandmother    Dementia Maternal Grandmother    Heart disease Maternal Grandfather     ALLERGIES:  is allergic to augmentin [amoxicillin-pot clavulanate] and sulfa antibiotics.  MEDICATIONS:  Current Outpatient Medications  Medication Sig Dispense Refill   amLODipine (NORVASC) 5 MG tablet Take 5 mg by mouth at bedtime.     benazepril (LOTENSIN) 20 MG tablet Take 1 tablet (20 mg total) by mouth daily. 90 tablet 1   calcitRIOL (ROCALTROL) 0.25 MCG capsule Take 0.25 mcg by mouth daily.     carvedilol (COREG) 6.25 MG tablet Take 6.25 mg by mouth 2 (two) times daily with a meal.     cyclobenzaprine (FLEXERIL) 10 MG tablet Take 1 tablet (10 mg total) by mouth 2 (two) times daily as needed for muscle spasms. 60 tablet 3   insulin NPH Human (NOVOLIN N) 100 UNIT/ML injection Inject 0.2 mLs (20 Units total) into the skin 2 (two) times daily before a meal. 10 mL 11   Insulin Pen Needle (PEN NEEDLES 31GX5/16") 31G X 8 MM MISC 1 each by Does not apply route as directed. 100 each 1   insulin regular (HUMULIN R) 100 units/mL injection Inject into skin before meals according to sliding scale. Max dose 48 units per 24 hours 10 mL 11   methadone (DOLOPHINE) 1 MG/1ML solution Take 95 mg by mouth daily. Methadone clinic     metoCLOPramide (REGLAN) 10 MG tablet Take 1 tablet (10 mg total) by mouth 2 (two) times daily as needed for nausea or vomiting. 180 tablet 1   pantoprazole (PROTONIX) 40 MG tablet Take 1 tablet (40 mg total) by mouth daily. 90 tablet 1   sodium bicarbonate 650 MG tablet Take 650 mg by mouth 2 (two) times daily.     sodium zirconium cyclosilicate (LOKELMA) 10 g PACK packet Take 10 g by mouth daily.     No current facility-administered medications for this  visit.     Marland Kitchen  PHYSICAL EXAMINATION:   Vitals:   02/15/23 1518  BP: 136/74  Pulse: 89  Temp: (!) 96.9 F (36.1 C)  SpO2: 100%   Filed Weights   02/15/23 1518  Weight: 188 lb 11.2 oz (85.6 kg)    Physical Exam Vitals and nursing note reviewed.  HENT:     Head: Normocephalic and atraumatic.     Mouth/Throat:     Pharynx: Oropharynx is clear.  Eyes:     Extraocular Movements: Extraocular movements intact.     Pupils: Pupils are equal, round, and reactive to light.  Cardiovascular:     Rate and Rhythm: Normal rate and regular rhythm.  Pulmonary:     Comments:  Decreased breath sounds bilaterally.  Abdominal:     Palpations: Abdomen is soft.  Musculoskeletal:        General: Normal range of motion.     Cervical back: Normal range of motion.  Skin:    General: Skin is warm.  Neurological:     General: No focal deficit present.     Mental Status: He is alert and oriented to person, place, and time.  Psychiatric:        Behavior: Behavior normal.        Judgment: Judgment normal.      LABORATORY DATA:  I have reviewed the data as listed Lab Results  Component Value Date   WBC 9.6 01/21/2023   HGB 9.0 (L) 01/21/2023   HCT 28.9 (L) 01/21/2023   MCV 87.3 01/21/2023   PLT 365 01/21/2023   Recent Labs    07/02/22 0958 08/03/22 1001 08/05/22 1126 01/21/23 1454  NA 135 134* 135 131*  K 5.9* 5.2* 5.5* 5.3*  CL 107 107 108 99  CO2 20* 18*  --  21*  GLUCOSE 135* 204* 139* 175*  BUN 54* 61* 59* 78*  CREATININE 3.63* 3.72* 4.80* 4.63*  CALCIUM 9.2 9.2  --  8.8*  GFRNONAA 22* 21*  --  16*  PROT  --   --   --  9.8*  ALBUMIN  --   --   --  3.5  AST  --   --   --  31  ALT  --   --   --  37  ALKPHOS  --   --   --  94  BILITOT  --   --   --  <0.1*     No results found.  ASSESSMENT & PLAN:   Anemia of chronic kidney failure, stage 4 (severe) (HCC) # Chronic anemia-normocytic recently getting worse.  Patient is mildy symptomatic from anemia.  Etiology likely  from chronic kidney disease.  May 2024-  iron saturation 12 ; ferritin 80 ; hemoglobin-9.   # HOLD Venofer [Poor IV access/reluctant]; Recommend gentle iron [iron biglycinate; 28 mg ] 2 pill a day.  This pill is unlikely to cause stomach upset or cause constipation. Retacrit approved by insurance only at home.  Will hold off giving him prescription at this time waiting for iron levels improved.  # IDDM- [32 years old]- on Insulin  [PCP]; HbA1c- 7.0-monitor closely.  # History of cocaine/marijuana -currently no IVDA-monitor closely for elevated blood pressures.  # PVD/ Left foot s/p Dr.Dew evaluation- awaiting medical optimization holding off surgery.   # Substance abuse: discussed re: cocaine IV- counseled to quit.   # DISPOSITION: # HOLD venofer # follow up 2 months- MD;labs- cbc/cmp; iron studies; ferriitn; - possible venofer- Dr.B  All questions were answered. The patient knows to call the clinic with any problems, questions or concerns.   Earna Coder, MD 02/15/2023 3:55 PM

## 2023-02-15 NOTE — Patient Instructions (Addendum)
#  Recommend gentle iron [iron biglycinate; 28 mg ] 2 pill a day.  This pill is unlikely to cause stomach upset or cause constipation.   

## 2023-02-15 NOTE — Progress Notes (Signed)
Fatigue/weakness: yes Dyspena: no Light headedness: no Blood in stool: no  Last Thursday 6/13/242 pt had cocaine.

## 2023-02-15 NOTE — Assessment & Plan Note (Addendum)
#   Chronic anemia-normocytic recently getting worse.  Patient is mildy symptomatic from anemia.  Etiology likely from chronic kidney disease.  May 2024-  iron saturation 12 ; ferritin 80 ; hemoglobin-9.   # HOLD Venofer [Poor IV access/reluctant]; Recommend gentle iron [iron biglycinate; 28 mg ] 2 pill a day.  This pill is unlikely to cause stomach upset or cause constipation. Retacrit approved by insurance only at home.  Will hold off giving him prescription at this time waiting for iron levels improved.  # IDDM- [32 years old]- on Insulin  [PCP]; HbA1c- 7.0-monitor closely.  # History of cocaine/marijuana -currently no IVDA-monitor closely for elevated blood pressures.  # PVD/ Left foot s/p Dr.Dew evaluation- awaiting medical optimization holding off surgery.   # Substance abuse: discussed re: cocaine IV- counseled to quit.   # DISPOSITION: # HOLD venofer # follow up 2 months- MD;labs- cbc/cmp; iron studies; ferriitn; - possible venofer- Dr.B

## 2023-02-16 ENCOUNTER — Encounter: Payer: Self-pay | Admitting: Internal Medicine

## 2023-02-17 ENCOUNTER — Telehealth: Payer: Self-pay

## 2023-02-17 NOTE — Telephone Encounter (Signed)
I called and spoke to [facility] about pts retacrit with [facility] # [phone]. Case# [id number]   Per Dr. Senaida Lange last office note on 02/16/23, he is holding off on the rx at this time.  If we need approval in the future, [facility] needs a hemoglobin and hematocrit within 4 weeks of request.

## 2023-02-28 ENCOUNTER — Ambulatory Visit: Payer: Commercial Managed Care - HMO | Admitting: Nurse Practitioner

## 2023-04-18 ENCOUNTER — Inpatient Hospital Stay: Payer: Commercial Managed Care - HMO | Attending: Internal Medicine | Admitting: Internal Medicine

## 2023-04-18 ENCOUNTER — Encounter: Payer: Self-pay | Admitting: Internal Medicine

## 2023-04-18 ENCOUNTER — Inpatient Hospital Stay: Payer: Commercial Managed Care - HMO

## 2023-04-18 VITALS — BP 142/86 | HR 85 | Temp 97.8°F | Ht 68.0 in | Wt 194.0 lb

## 2023-04-18 DIAGNOSIS — N184 Chronic kidney disease, stage 4 (severe): Secondary | ICD-10-CM | POA: Diagnosis present

## 2023-04-18 DIAGNOSIS — F17210 Nicotine dependence, cigarettes, uncomplicated: Secondary | ICD-10-CM | POA: Insufficient documentation

## 2023-04-18 DIAGNOSIS — Z79899 Other long term (current) drug therapy: Secondary | ICD-10-CM | POA: Insufficient documentation

## 2023-04-18 DIAGNOSIS — F1190 Opioid use, unspecified, uncomplicated: Secondary | ICD-10-CM

## 2023-04-18 DIAGNOSIS — D631 Anemia in chronic kidney disease: Secondary | ICD-10-CM | POA: Diagnosis present

## 2023-04-18 DIAGNOSIS — Z794 Long term (current) use of insulin: Secondary | ICD-10-CM | POA: Insufficient documentation

## 2023-04-18 DIAGNOSIS — I129 Hypertensive chronic kidney disease with stage 1 through stage 4 chronic kidney disease, or unspecified chronic kidney disease: Secondary | ICD-10-CM | POA: Insufficient documentation

## 2023-04-18 LAB — URINE DRUG SCREEN, QUALITATIVE (ARMC ONLY)
Amphetamines, Ur Screen: NOT DETECTED
Barbiturates, Ur Screen: NOT DETECTED
Benzodiazepine, Ur Scrn: NOT DETECTED
Cannabinoid 50 Ng, Ur ~~LOC~~: NOT DETECTED
Cocaine Metabolite,Ur ~~LOC~~: POSITIVE — AB
MDMA (Ecstasy)Ur Screen: NOT DETECTED
Methadone Scn, Ur: POSITIVE — AB
Opiate, Ur Screen: NOT DETECTED
Phencyclidine (PCP) Ur S: NOT DETECTED
Tricyclic, Ur Screen: POSITIVE — AB

## 2023-04-18 LAB — CBC WITH DIFFERENTIAL (CANCER CENTER ONLY)
Abs Immature Granulocytes: 0.02 10*3/uL (ref 0.00–0.07)
Basophils Absolute: 0.1 10*3/uL (ref 0.0–0.1)
Basophils Relative: 1 %
Eosinophils Absolute: 0.7 10*3/uL — ABNORMAL HIGH (ref 0.0–0.5)
Eosinophils Relative: 7 %
HCT: 31.7 % — ABNORMAL LOW (ref 39.0–52.0)
Hemoglobin: 9.9 g/dL — ABNORMAL LOW (ref 13.0–17.0)
Immature Granulocytes: 0 %
Lymphocytes Relative: 47 %
Lymphs Abs: 4.6 10*3/uL — ABNORMAL HIGH (ref 0.7–4.0)
MCH: 28.1 pg (ref 26.0–34.0)
MCHC: 31.2 g/dL (ref 30.0–36.0)
MCV: 90.1 fL (ref 80.0–100.0)
Monocytes Absolute: 0.8 10*3/uL (ref 0.1–1.0)
Monocytes Relative: 8 %
Neutro Abs: 3.6 10*3/uL (ref 1.7–7.7)
Neutrophils Relative %: 37 %
Platelet Count: 290 10*3/uL (ref 150–400)
RBC: 3.52 MIL/uL — ABNORMAL LOW (ref 4.22–5.81)
RDW: 14.6 % (ref 11.5–15.5)
WBC Count: 9.7 10*3/uL (ref 4.0–10.5)
nRBC: 0 % (ref 0.0–0.2)

## 2023-04-18 LAB — CMP (CANCER CENTER ONLY)
ALT: 35 U/L (ref 0–44)
AST: 30 U/L (ref 15–41)
Albumin: 3.9 g/dL (ref 3.5–5.0)
Alkaline Phosphatase: 80 U/L (ref 38–126)
Anion gap: 8 (ref 5–15)
BUN: 75 mg/dL — ABNORMAL HIGH (ref 6–20)
CO2: 19 mmol/L — ABNORMAL LOW (ref 22–32)
Calcium: 8.9 mg/dL (ref 8.9–10.3)
Chloride: 104 mmol/L (ref 98–111)
Creatinine: 4.53 mg/dL — ABNORMAL HIGH (ref 0.61–1.24)
GFR, Estimated: 17 mL/min — ABNORMAL LOW (ref >60)
Glucose, Bld: 68 mg/dL — ABNORMAL LOW (ref 70–99)
Potassium: 5.4 mmol/L — ABNORMAL HIGH (ref 3.5–5.1)
Sodium: 131 mmol/L — ABNORMAL LOW (ref 135–145)
Total Bilirubin: 0.4 mg/dL (ref 0.3–1.2)
Total Protein: 9.1 g/dL — ABNORMAL HIGH (ref 6.5–8.1)

## 2023-04-18 LAB — IRON AND TIBC
Iron: 101 ug/dL (ref 45–182)
Saturation Ratios: 29 % (ref 17.9–39.5)
TIBC: 346 ug/dL (ref 250–450)
UIBC: 245 ug/dL

## 2023-04-18 LAB — FERRITIN: Ferritin: 82 ng/mL (ref 24–336)

## 2023-04-18 NOTE — Progress Notes (Signed)
Fatigue/weakness: no Dyspena: no  Light headedness: no  Blood in stool:  no  Has neuropathy in feet.

## 2023-04-18 NOTE — Assessment & Plan Note (Addendum)
#   Chronic anemia-normocytic recently getting worse.  Patient is mildy symptomatic from anemia.  Etiology likely from chronic kidney disease.  May 2024-  iron saturation 12 ; ferritin 80 ; hemoglobin-9.9   # HOLD Venofer [Poor IV access/reluctant]; continue gentle iron [iron biglycinate; 28 mg ] 2 pill a day.  Retacrit approved by insurance only at home; pt reluctant with home shots.   # IDDM- [32 years old]- on Insulin  [PCP]; HbA1c- 7.0-monitor closely.  # History of cocaine/marijuana -currently no IVDA-monitor closely for elevated blood pressures.counseled to quit.    # PVD/ Left foot s/p Dr.Dew evaluation- awaiting medical optimization holding off surgery- stable.  # Hyperkalemia- chronic 5.4- reocmmend use lokelma [Dr.lateef]  # DISPOSITION: # HOLD venofer # follow up 3 months- MD;labs- cbc/cmp; iron studies; ferriitn; - possible venofer- Dr.B

## 2023-04-18 NOTE — Progress Notes (Signed)
[facility] CONSULT NOTE  Patient Care Team: Sallyanne Kuster, NP as PCP - General (Nurse Practitioner) Earna Coder, MD as Consulting Physician (Oncology)  CHIEF COMPLAINTS/PURPOSE OF CONSULTATION: ANEMIA   HEMATOLOGY HISTORY  # ANEMIA[Hb; MCV-platelets- WBC; Iron sat; ferritin;  GFR- CT/US; EGD/colonoscopy-  # CKD-IV [Dr.Lateef]  HISTORY OF PRESENTING ILLNESS: Patient ambulating-with assistance/crutches.  Accompanied by mother.  Jason Francis 32 y.o.  male pleasant patient with multiple medical problems including chronic kidney disease stage IV/insulin-dependent diabetes; history of hep C/history of IVDA"-today for follow-up.  Patient states that he is compliant with his  iron tablets. Denies any blood in stools or black or stools.  However pt states currently does cocaine and marijuana occasionally.  Last cocaine use approximately 5 days ago.   C/o needing lt foot amputated.    Review of Systems  Constitutional:  Positive for malaise/fatigue. Negative for chills, diaphoresis, fever and weight loss.  HENT:  Negative for nosebleeds and sore throat.   Eyes:  Negative for double vision.  Respiratory:  Negative for cough, hemoptysis, sputum production, shortness of breath and wheezing.   Cardiovascular:  Negative for chest pain, palpitations, orthopnea and leg swelling.  Gastrointestinal:  Negative for abdominal pain, blood in stool, constipation, diarrhea, heartburn, melena, nausea and vomiting.  Genitourinary:  Negative for dysuria, frequency and urgency.  Musculoskeletal:  Positive for back pain and joint pain.  Skin: Negative.  Negative for itching and rash.  Neurological:  Negative for dizziness, tingling, focal weakness, weakness and headaches.  Endo/Heme/Allergies:  Does not bruise/bleed easily.  Psychiatric/Behavioral:  Negative for depression. The patient is not nervous/anxious and does not have insomnia.      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  Vaping Use   Vaping status: Former  Substance and Sexual Activity   Alcohol use: Not Currently   Drug use: Yes    Types: Marijuana, Cocaine    Comment: last stated cocaine use 07/27/22; marijuana daily   Sexual activity: Yes    Partners: Female  Other Topics Concern   Not on file  Social History Narrative   Not on file   Social Determinants of Health   Financial Resource Strain: Low Risk  (03/01/2018)   Received from [facility],  [facility]   Overall Financial Resource Strain (CARDIA)    Difficulty of Paying Living Expenses: Not hard at all  Food Insecurity: No Food Insecurity (01/21/2023)   Hunger Vital Sign    Worried About Running Out of Food in the Last Year: Never true    Ran Out of Food in the Last Year: Never true  Transportation Needs: No Transportation Needs (01/21/2023)   PRAPARE - Administrator, Civil Service  (Medical): No    Lack of Transportation (Non-Medical): No  Physical Activity: Inactive (03/01/2018)   Received from [facility], [facility]   Exercise Vital Sign    Days of Exercise per Week: 0 days    Minutes of Exercise per Session: 0 min  Stress: Stress Concern Present (03/01/2018)   Received from [facility], [facility]   [employer] of Occupational Health - Occupational Stress Questionnaire    Feeling of Stress : Rather much  Social Connections: Unknown (03/01/2018)   Received from [facility], [facility]   Social Connection and Isolation Panel [NHANES]    Frequency of Communication with Friends and Family: Three times a week    Frequency of Social Gatherings with Friends and Family: Three times a week    Attends Religious Services: Never    Active Member of Clubs or Organizations: No    Attends Banker Meetings: Never    Marital Status: Not on file  Intimate Partner Violence: Not At Risk (01/21/2023)   Humiliation, Afraid, Rape, and Kick questionnaire    Fear of Current or Ex-Partner: No    Emotionally Abused: No    Physically Abused: No    Sexually Abused: No    FAMILY HISTORY: Family History  Problem Relation Age of Onset   Heart disease Mother    Hypertension Mother    Obesity Mother    Gout Mother    Thyroid disease Mother    High blood pressure Maternal Grandmother    Dementia Maternal Grandmother    Heart disease Maternal Grandfather     ALLERGIES:  is allergic to augmentin [amoxicillin-pot clavulanate] and sulfa antibiotics.  MEDICATIONS:  Current Outpatient Medications  Medication Sig Dispense Refill   amLODipine (NORVASC) 5 MG tablet Take 5 mg by mouth at bedtime.     benazepril (LOTENSIN) 20 MG tablet Take 1 tablet (20 mg total) by mouth daily. 90 tablet 1   calcitRIOL (ROCALTROL) 0.25 MCG capsule Take 0.25 mcg by mouth daily.      carvedilol (COREG) 6.25 MG tablet Take 6.25 mg by mouth 2 (two) times daily with a meal.     cyclobenzaprine (FLEXERIL) 10 MG tablet Take 1 tablet (10 mg total) by mouth 2 (two) times daily as needed for muscle spasms. 60 tablet 3   insulin NPH Human (NOVOLIN N) 100 UNIT/ML injection Inject 0.2 mLs (20 Units total) into the skin 2 (two) times daily before a meal. 10 mL 11   Insulin Pen Needle (PEN NEEDLES 31GX5/16") 31G X 8 MM MISC 1 each by Does not apply route as directed. 100 each 1   insulin regular (HUMULIN R) 100 units/mL injection Inject into skin before meals according to sliding scale. Max dose 48 units per 24 hours 10 mL 11   methadone (DOLOPHINE) 1 MG/1ML solution Take 95 mg by mouth daily. Methadone clinic     metoCLOPramide (REGLAN) 10 MG tablet Take 1 tablet (10 mg total)  by mouth 2 (two) times daily as needed for nausea or vomiting. 180 tablet 1   pantoprazole (PROTONIX) 40 MG tablet Take 1 tablet (40 mg total) by mouth daily. 90 tablet 1   sodium bicarbonate 650 MG tablet Take 650 mg by mouth 2 (two) times daily.     sodium zirconium cyclosilicate (LOKELMA) 10 g PACK packet Take 10 g by mouth daily.     No current facility-administered medications for this visit.     Marland Kitchen  PHYSICAL EXAMINATION:   Vitals:   04/18/23 1415  BP: (!) 142/86  Pulse: 85  Temp: 97.8 F (36.6 C)  SpO2: 100%    Filed Weights   04/18/23 1415  Weight: 194 lb (88 kg)     Physical Exam Vitals and nursing note reviewed.  HENT:     Head: Normocephalic and atraumatic.     Mouth/Throat:     Pharynx: Oropharynx is clear.  Eyes:     Extraocular Movements: Extraocular movements intact.     Pupils: Pupils are equal, round, and reactive to light.  Cardiovascular:     Rate and Rhythm: Normal rate and regular rhythm.  Pulmonary:     Comments: Decreased breath sounds bilaterally.  Abdominal:     Palpations: Abdomen is soft.  Musculoskeletal:        General: Normal range of motion.     Cervical  back: Normal range of motion.  Skin:    General: Skin is warm.  Neurological:     General: No focal deficit present.     Mental Status: He is alert and oriented to person, place, and time.  Psychiatric:        Behavior: Behavior normal.        Judgment: Judgment normal.      LABORATORY DATA:  I have reviewed the data as listed Lab Results  Component Value Date   WBC 9.7 04/18/2023   HGB 9.9 (L) 04/18/2023   HCT 31.7 (L) 04/18/2023   MCV 90.1 04/18/2023   PLT 290 04/18/2023   Recent Labs    08/03/22 1001 08/05/22 1126 01/21/23 1454 04/18/23 1428  NA 134* 135 131* 131*  K 5.2* 5.5* 5.3* 5.4*  CL 107 108 99 104  CO2 18*  --  21* 19*  GLUCOSE 204* 139* 175* 68*  BUN 61* 59* 78* 75*  CREATININE 3.72* 4.80* 4.63* 4.53*  CALCIUM 9.2  --  8.8* 8.9  GFRNONAA 21*  --  16* 17*  PROT  --   --  9.8* 9.1*  ALBUMIN  --   --  3.5 3.9  AST  --   --  31 30  ALT  --   --  37 35  ALKPHOS  --   --  94 80  BILITOT  --   --  <0.1* 0.4     No results found.  ASSESSMENT & PLAN:   Anemia of chronic kidney failure, stage 4 (severe) (HCC) # Chronic anemia-normocytic recently getting worse.  Patient is mildy symptomatic from anemia.  Etiology likely from chronic kidney disease.  May 2024-  iron saturation 12 ; ferritin 80 ; hemoglobin-9.9   # HOLD Venofer [Poor IV access/reluctant]; continue gentle iron [iron biglycinate; 28 mg ] 2 pill a day.  Retacrit approved by insurance only at home; pt reluctant with home shots.   # IDDM- [32 years old]- on Insulin  [PCP]; HbA1c- 7.0-monitor closely.  # History of cocaine/marijuana -currently no IVDA-monitor closely for elevated blood pressures.counseled to quit.    #  PVD/ Left foot s/p Dr.Dew evaluation- awaiting medical optimization holding off surgery- stable.  # Hyperkalemia- chronic 5.4- reocmmend use lokelma [Dr.lateef]  # DISPOSITION: # HOLD venofer # follow up 3 months- MD;labs- cbc/cmp; iron studies; ferriitn; - possible venofer-  Dr.B   All questions were answered. The patient knows to call the clinic with any problems, questions or concerns.   Earna Coder, MD 04/18/2023 3:19 PM

## 2023-04-19 ENCOUNTER — Other Ambulatory Visit: Payer: Self-pay | Admitting: Nurse Practitioner

## 2023-04-19 DIAGNOSIS — R1114 Bilious vomiting: Secondary | ICD-10-CM

## 2023-05-08 ENCOUNTER — Other Ambulatory Visit: Payer: Self-pay

## 2023-05-08 ENCOUNTER — Emergency Department: Payer: Commercial Managed Care - HMO

## 2023-05-08 ENCOUNTER — Emergency Department
Admission: EM | Admit: 2023-05-08 | Discharge: 2023-05-08 | Disposition: A | Discharge status: Home / Self Care | Payer: Commercial Managed Care - HMO | Attending: Emergency Medicine | Admitting: Emergency Medicine

## 2023-05-08 DIAGNOSIS — I10 Essential (primary) hypertension: Secondary | ICD-10-CM | POA: Insufficient documentation

## 2023-05-08 DIAGNOSIS — R319 Hematuria, unspecified: Secondary | ICD-10-CM | POA: Diagnosis present

## 2023-05-08 DIAGNOSIS — N3001 Acute cystitis with hematuria: Secondary | ICD-10-CM | POA: Insufficient documentation

## 2023-05-08 DIAGNOSIS — E875 Hyperkalemia: Secondary | ICD-10-CM | POA: Insufficient documentation

## 2023-05-08 LAB — CBC
HCT: 34.9 % — ABNORMAL LOW (ref 39.0–52.0)
Hemoglobin: 11.1 g/dL — ABNORMAL LOW (ref 13.0–17.0)
MCH: 28.9 pg (ref 26.0–34.0)
MCHC: 31.8 g/dL (ref 30.0–36.0)
MCV: 90.9 fL (ref 80.0–100.0)
Platelets: 279 10*3/uL (ref 150–400)
RBC: 3.84 MIL/uL — ABNORMAL LOW (ref 4.22–5.81)
RDW: 14.3 % (ref 11.5–15.5)
WBC: 8.6 10*3/uL (ref 4.0–10.5)
nRBC: 0 % (ref 0.0–0.2)

## 2023-05-08 LAB — BASIC METABOLIC PANEL
Anion gap: 9 (ref 5–15)
BUN: 74 mg/dL — ABNORMAL HIGH (ref 6–20)
CO2: 18 mmol/L — ABNORMAL LOW (ref 22–32)
Calcium: 9 mg/dL (ref 8.9–10.3)
Chloride: 105 mmol/L (ref 98–111)
Creatinine, Ser: 4.67 mg/dL — ABNORMAL HIGH (ref 0.61–1.24)
GFR, Estimated: 16 mL/min — ABNORMAL LOW (ref >60)
Glucose, Bld: 143 mg/dL — ABNORMAL HIGH (ref 70–99)
Potassium: 5.2 mmol/L — ABNORMAL HIGH (ref 3.5–5.1)
Sodium: 132 mmol/L — ABNORMAL LOW (ref 135–145)

## 2023-05-08 LAB — URINALYSIS, ROUTINE W REFLEX MICROSCOPIC
Bilirubin Urine: NEGATIVE
Glucose, UA: 50 mg/dL — AB
Ketones, ur: NEGATIVE mg/dL
Nitrite: NEGATIVE
Protein, ur: 100 mg/dL — AB
RBC / HPF: >50 RBC/hpf (ref 0–5)
Specific Gravity, Urine: 1.01 (ref 1.005–1.030)
Squamous Epithelial / HPF: 0 /HPF (ref 0–5)
WBC, UA: >50 WBC/hpf (ref 0–5)
pH: 6 (ref 5.0–8.0)

## 2023-05-08 MED ORDER — CEFDINIR 300 MG PO CAPS
300.0000 mg | ORAL_CAPSULE | ORAL | Status: AC
  Administered 2023-05-08: 300 mg via ORAL
  Filled 2023-05-08: qty 1

## 2023-05-08 MED ORDER — CEFPODOXIME PROXETIL 200 MG PO TABS
200.0000 mg | ORAL_TABLET | Freq: Every day | ORAL | 0 refills | Status: DC
Start: 2023-05-08 — End: 2023-07-19

## 2023-05-08 NOTE — ED Provider Notes (Signed)
[facility] Provider Note    Event Date/Time   First MD Initiated Contact with Patient 05/08/23 1806     (approximate)   History   Hematuria   HPI  Jason Francis is a 32 y.o. male with history of chronic renal disease, chronic hyperkalemia, hypertension, substance abuse  Patient reports that he is otherwise feeling well but noticed when he urinated this morning that it looked blood-tinged, noticed blood in it.  No pain no burning no difficulty with urination no fever.  No nausea or vomiting.  He just noticed that there is now blood in his urine.  He follows regularly with the kidney specialist follows with Dr. Cherylann Ratel and saw him about a month ago.  Concerned as he has noticed blood in his urine.  No passing any clots at the small amount of blood tingeing and reports the last time he just urinated seem like it was started to clear up     Physical Exam   Triage Vital Signs: ED Triage Vitals [05/08/23 1510]  Encounter Vitals Group     BP (!) 172/98     Systolic BP Percentile      Diastolic BP Percentile      Pulse Rate 87     Resp 16     Temp 97.9 F (36.6 C)     Temp Source Oral     SpO2 100 %     Weight 193 lb 12.6 oz (87.9 kg)     Height 5\' 8"  (1.727 m)     Head Circumference      Peak Flow      Pain Score 0     Pain Loc      Pain Education      Exclude from Growth Chart     Most recent vital signs: Vitals:   05/08/23 1510  BP: (!) 172/98  Pulse: 87  Resp: 16  Temp: 97.9 F (36.6 C)  SpO2: 100%     General: Awake, no distress.  He and his fiance both very pleasant Somewhat poor dentition CV:  Good peripheral perfusion.  Resp:  Normal effort.  Abd:  No distention.  Soft nontender nondistended Other:     ED Results / Procedures / Treatments   Labs (all labs ordered are listed, but only abnormal results are displayed) Labs Reviewed  URINALYSIS, ROUTINE W REFLEX MICROSCOPIC - Abnormal; Notable for the following  components:      Result Value   Color, Urine AMBER (*)    APPearance CLOUDY (*)    Glucose, UA 50 (*)    Hgb urine dipstick MODERATE (*)    Protein, ur 100 (*)    Leukocytes,Ua MODERATE (*)    Bacteria, UA MANY (*)    All other components within normal limits  BASIC METABOLIC PANEL - Abnormal; Notable for the following components:   Sodium 132 (*)    Potassium 5.2 (*)    CO2 18 (*)    Glucose, Bld 143 (*)    BUN 74 (*)    Creatinine, Ser 4.67 (*)    GFR, Estimated 16 (*)    All other components within normal limits  CBC - Abnormal; Notable for the following components:   RBC 3.84 (*)    Hemoglobin 11.1 (*)    HCT 34.9 (*)    All other components within normal limits  URINE CULTURE       RADIOLOGY  CT Renal Stone Study  Result Date: 05/08/2023 CLINICAL DATA:  Abdominal and flank pain. EXAM: CT ABDOMEN AND PELVIS WITHOUT CONTRAST TECHNIQUE: Multidetector CT imaging of the abdomen and pelvis was performed following the standard protocol without IV contrast. RADIATION DOSE REDUCTION: This exam was performed according to the departmental dose-optimization program which includes automated exposure control, adjustment of the mA and/or kV according to patient size and/or use of iterative reconstruction technique. COMPARISON:  Renal ultrasound 03/19/2022 FINDINGS: Lower chest: No acute abnormality. Hepatobiliary: No focal liver abnormality is seen. No gallstones, gallbladder wall thickening, or biliary dilatation. Pancreas: Small in size, but otherwise within normal limits. Spleen: Normal in size without focal abnormality. Adrenals/Urinary Tract: No urinary tract calculi are seen. There is mild bladder wall thickening versus normal under distension. There is mild bilateral perinephric fat stranding, nonspecific. There is mild right renal atrophy. Stomach/Bowel: Stomach is within normal limits. Appendix is not seen. No evidence of bowel wall thickening, distention, or inflammatory changes.  There is a large amount of stool throughout the colon. The rectum is dilated and stool-filled measuring up to 9 cm in diameter. Vascular/Lymphatic: Aorta and IVC are normal in size. There are nonenlarged upper abdominal and retroperitoneal lymph nodes. There is an enlarged periportal lymph node measuring 1.5 x 2.2 cm image 2/26. there are enlarged left inguinal lymph nodes measuring up to 15 mm short axis. Reproductive: Prostate is unremarkable. Other: No focal abdominal wall hernia or ascites. There some subcutaneous stranding just beneath the skin surface in the anterior abdominal wall. Musculoskeletal: No acute or significant osseous findings. IMPRESSION: 1. Mild bladder wall thickening versus normal under distension. Correlate clinically for cystitis. 2. Mild bilateral perinephric fat stranding, nonspecific. Correlate clinically for pyelonephritis. 3. Large amount of stool throughout the colon with dilated stool-filled rectum. 4. Enlarged periportal lymph node and left inguinal lymph nodes of uncertain etiology. 5. Subcutaneous stranding in the anterior abdominal wall just beneath the skin surface. Correlate clinically for cellulitis. Electronically Signed   By: Darliss Cheney M.D.   On: 05/08/2023 19:11    Regarding the patient's subcutaneous stranding, he advises that this is probably from where he does his insulin injection sites.  Anterior abdominal wall does not appear to have any erythema or acute abnormality but there is some slight thickening of the skin patient reports that he is chronically perform subcutaneous insulin injections in this area but is not causing any issue   PROCEDURES:  Critical Care performed: No  Procedures   MEDICATIONS ORDERED IN ED: Medications  cefdinir (OMNICEF) capsule 300 mg (300 mg Oral Given 05/08/23 1850)     IMPRESSION / MDM / ASSESSMENT AND PLAN / ED COURSE  I reviewed the triage vital signs and the nursing notes.                               Differential diagnosis includes, but is not limited to, cystitis, hematuria due to other cause such as bladder use, polyp, tumor, renal cyst, nephrolithiasis, etc.  Differential diagnosis for hematuria is somewhat broad.  Patient currently asymptomatic other than hematuria that she reports is somewhat improving now.  He is awake alert fully without discomfort, and hemodynamically stable  Based on his clinical history previous history as well as pretty significant but ongoing AKI that demonstrates stability in his creatinine and chronic mild hyperkalemia.  Suspect the patient may have early evidence of potential hemorrhagic cystitis or UTI.  He has no costovertebral angle or flank tenderness fever or elevated white count to  suggest pyelonephritis or more severe complicated infection  Reviewed antibiotic selection and antibiotics based on treatment with patient's reduced renal function.  Patient's presentation is most consistent with acute complicated illness / injury requiring diagnostic workup.  Urine sent for culture.  ----------------------------------------- 7:44 PM on 05/08/2023 ----------------------------------------- Patient comfortable plan for outpatient antibiotic treatment and follow-up with Dr. Tedd Sias and Cherylann Ratel.  Return precautions and treatment recommendations and follow-up discussed with the patient who is agreeable with the plan.         FINAL CLINICAL IMPRESSION(S) / ED DIAGNOSES   Final diagnoses:  Acute hemorrhagic cystitis     Rx / DC Orders   ED Discharge Orders          Ordered    cefpodoxime (VANTIN) 200 MG tablet  Daily        05/08/23 1819             Note:  This document was prepared using Dragon voice recognition software and may include unintentional dictation errors.   Sharyn Creamer, MD 05/08/23 1944

## 2023-05-08 NOTE — ED Notes (Addendum)
No complaints of burning, pain

## 2023-05-08 NOTE — Discharge Instructions (Addendum)
You have been seen in the Emergency Department (ED) today for blood in your urine.  Your workup today suggests that you have a urinary tract infection (UTI).   Call your regular doctor to schedule the next available appointment to follow up on today's ED visit, or return immediately to the ED if your pain worsens, you have decreased urine production, develop fever, persistent vomiting, or other symptoms that concern you.

## 2023-05-09 NOTE — Group Note (Deleted)

## 2023-05-11 ENCOUNTER — Other Ambulatory Visit: Payer: Self-pay | Admitting: Nurse Practitioner

## 2023-05-11 DIAGNOSIS — R1114 Bilious vomiting: Secondary | ICD-10-CM

## 2023-05-11 LAB — URINE CULTURE: Culture: >=100000 — AB

## 2023-05-11 NOTE — Telephone Encounter (Signed)
Last 5/24 next 9/24

## 2023-05-16 ENCOUNTER — Encounter: Payer: Self-pay | Admitting: Nurse Practitioner

## 2023-05-16 ENCOUNTER — Ambulatory Visit (INDEPENDENT_AMBULATORY_CARE_PROVIDER_SITE_OTHER): Payer: Commercial Managed Care - HMO | Admitting: Nurse Practitioner

## 2023-05-16 VITALS — BP 130/75 | HR 74 | Temp 98.2°F | Resp 16 | Ht 68.0 in | Wt 195.8 lb

## 2023-05-16 DIAGNOSIS — E1022 Type 1 diabetes mellitus with diabetic chronic kidney disease: Secondary | ICD-10-CM | POA: Diagnosis not present

## 2023-05-16 DIAGNOSIS — N184 Chronic kidney disease, stage 4 (severe): Secondary | ICD-10-CM

## 2023-05-16 DIAGNOSIS — N3001 Acute cystitis with hematuria: Secondary | ICD-10-CM | POA: Diagnosis not present

## 2023-05-16 DIAGNOSIS — R1114 Bilious vomiting: Secondary | ICD-10-CM

## 2023-05-16 DIAGNOSIS — E1159 Type 2 diabetes mellitus with other circulatory complications: Secondary | ICD-10-CM

## 2023-05-16 DIAGNOSIS — I152 Hypertension secondary to endocrine disorders: Secondary | ICD-10-CM

## 2023-05-16 LAB — POCT URINALYSIS DIPSTICK
Bilirubin, UA: NEGATIVE
Glucose, UA: NEGATIVE
Ketones, UA: NEGATIVE
Leukocytes, UA: NEGATIVE
Nitrite, UA: NEGATIVE
Protein, UA: NEGATIVE
Spec Grav, UA: 1.01 (ref 1.010–1.025)
Urobilinogen, UA: 0.2 U/dL
pH, UA: 5 (ref 5.0–8.0)

## 2023-05-16 LAB — POCT GLYCOSYLATED HEMOGLOBIN (HGB A1C): Hemoglobin A1C: 7.1 % — AB (ref 4.0–5.6)

## 2023-05-16 MED ORDER — BENAZEPRIL HCL 20 MG PO TABS: 20.0000 mg | ORAL_TABLET | Freq: Every day | ORAL | 1 refills | Status: DC

## 2023-05-16 MED ORDER — CIPROFLOXACIN HCL 500 MG PO TABS
500.0000 mg | ORAL_TABLET | Freq: Two times a day (BID) | ORAL | 0 refills | Status: AC
Start: 2023-05-16 — End: 2023-05-26

## 2023-05-16 MED ORDER — PANTOPRAZOLE SODIUM 40 MG PO TBEC
40.0000 mg | DELAYED_RELEASE_TABLET | Freq: Every day | ORAL | 1 refills | Status: DC
Start: 2023-05-16 — End: 2023-12-08

## 2023-05-16 NOTE — Progress Notes (Signed)
[facility] [address] [place], [place] [address]  Internal MEDICINE  Office Visit Note  Patient Name: Jason Francis  [id number]  [id number]  Date of Service: 05/16/2023  Chief Complaint  Patient presents with   Diabetes   Gastroesophageal Reflux   Hypertension   Follow-up    HPI [place] presents for a follow-up visit for diabetes, CKD and UTI Diabetes -- A1c slightly improved down to 7.1 today from 7.5 previously. No changes to current insulin regimen UTI -- finished course of cefdinir, culture grew klebsiella pneumonia MDR, still has blood in urine per urinalysis, sent new culture today, new antibiotic.  Refills for BP and nausea medications are due    Current Medication: Outpatient Encounter Medications as of 05/16/2023  Medication Sig   amLODipine (NORVASC) 5 MG tablet Take 5 mg by mouth at bedtime.   calcitRIOL (ROCALTROL) 0.25 MCG capsule Take 0.25 mcg by mouth daily.   carvedilol (COREG) 6.25 MG tablet Take 6.25 mg by mouth 2 (two) times daily with a meal.   cefpodoxime (VANTIN) 200 MG tablet Take 1 tablet (200 mg total) by mouth daily.   ciprofloxacin (CIPRO) 500 MG tablet Take 1 tablet (500 mg total) by mouth 2 (two) times daily for 10 days. Take with food   cyclobenzaprine (FLEXERIL) 10 MG tablet Take 1 tablet (10 mg total) by mouth 2 (two) times daily as needed for muscle spasms.   insulin NPH Human (NOVOLIN N) 100 UNIT/ML injection Inject 0.2 mLs (20 Units total) into the skin 2 (two) times daily before a meal.   Insulin Pen Needle (PEN NEEDLES 31GX5/16") 31G X 8 MM MISC 1 each by Does not apply route as directed.   insulin regular (HUMULIN R) 100 units/mL injection Inject into skin before meals according to sliding scale. Max dose 48 units per 24 hours   methadone (DOLOPHINE) 1 MG/1ML solution Take 95 mg by mouth daily. Methadone clinic   metoCLOPramide (REGLAN) 10 MG tablet TAKE 1 TABLET BY MOUTH TWICE DAILY AS NEEDED FOR NAUSEA FOR VOMITING   sodium  bicarbonate 650 MG tablet Take 650 mg by mouth 2 (two) times daily.   sodium zirconium cyclosilicate (LOKELMA) 10 g PACK packet Take 10 g by mouth daily.   [DISCONTINUED] benazepril (LOTENSIN) 20 MG tablet Take 1 tablet (20 mg total) by mouth daily.   [DISCONTINUED] pantoprazole (PROTONIX) 40 MG tablet Take 1 tablet by mouth once daily   benazepril (LOTENSIN) 20 MG tablet Take 1 tablet (20 mg total) by mouth daily.   pantoprazole (PROTONIX) 40 MG tablet Take 1 tablet (40 mg total) by mouth daily.   No facility-administered encounter medications on file as of 05/16/2023.    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)     Family History: Family History  Problem Relation Age of  Onset   Heart disease Mother    Hypertension Mother    Obesity Mother    Gout Mother    Thyroid disease Mother    High blood pressure Maternal Grandmother    Dementia Maternal Grandmother    Heart disease Maternal Grandfather     Social History   Socioeconomic History   Marital status: Single    Spouse name: Not on file   Number of children: Not on file   Years of education: Not on file   Highest education level: Not on file  Occupational History   Not on file  Tobacco Use   Smoking status: Every Day    Current packs/day: 2.00    Types: Cigarettes   Smokeless tobacco: Never   Tobacco comments:    1/2 pack daily.  Vaping Use    Vaping status: Former  Substance and Sexual Activity   Alcohol use: Not Currently   Drug use: Yes    Types: Marijuana, Cocaine    Comment: last stated cocaine use 07/27/22; marijuana daily   Sexual activity: Yes    Partners: Female  Other Topics Concern   Not on file  Social History Narrative   Not on file   Social Determinants of Health   Financial Resource Strain: Low Risk  (03/01/2018)   Received from [facility], [facility]   Overall Financial Resource Strain (CARDIA)    Difficulty of Paying Living Expenses: Not hard at all  Food Insecurity: No Food Insecurity (01/21/2023)   Hunger Vital Sign    Worried About Running Out of Food in the Last Year: Never true    Ran Out of Food in the Last Year: Never true  Transportation Needs: No Transportation Needs (01/21/2023)   PRAPARE - Administrator, Civil Service (Medical): No    Lack of Transportation (Non-Medical): No  Physical Activity: Inactive (03/01/2018)   Received from [facility], [facility]   Exercise Vital Sign    Days of Exercise per Week: 0 days    Minutes of Exercise per Session: 0 min  Stress: Stress Concern Present (03/01/2018)   Received from [facility], [facility]   [employer] of Occupational Health - Occupational Stress Questionnaire    Feeling of Stress : Rather much  Social Connections: Unknown (03/01/2018)   Received from [facility], [facility]   Social Connection and Isolation Panel [NHANES]    Frequency of Communication with Friends and Family: Three times a week    Frequency of Social Gatherings with Friends and Family: Three times a week    Attends Religious Services: Never    Active Member of Clubs or Organizations: No    Attends Banker Meetings: Never    Marital Status: Not on file  Intimate Partner Violence: Not At Risk  (01/21/2023)   Humiliation, Afraid, Rape, and Kick questionnaire    Fear of Current or Ex-Partner: No    Emotionally Abused: No    Physically Abused: No    Sexually Abused: No      Review of Systems  Constitutional:  Negative for chills, fatigue and unexpected weight change.  HENT:  Positive for dental problem and postnasal drip. Negative for congestion, rhinorrhea, sneezing and sore throat.   Eyes:  Negative for redness.  Respiratory: Negative.  Negative for cough, chest tightness, shortness of breath and wheezing.   Cardiovascular: Negative.  Negative for chest  pain and palpitations.  Gastrointestinal:  Negative for abdominal pain, constipation, diarrhea, nausea and vomiting.  Genitourinary:  Positive for dysuria, flank pain, frequency and urgency.  Musculoskeletal:  Positive for arthralgias (Left neck pain), gait problem (Due to Charcot's joint of the left ankle, ambulates with crutches) and neck pain (Left side). Negative for back pain and joint swelling.  Skin: Negative.  Negative for rash.  Neurological:  Negative for tremors and numbness.  Hematological:  Negative for adenopathy. Does not bruise/bleed easily.  Psychiatric/Behavioral:  Negative for behavioral problems (Depression), sleep disturbance and suicidal ideas. The patient is not nervous/anxious.     Vital Signs: BP 130/75 Comment: 150/78  Pulse 74   Temp 98.2 F (36.8 C)   Resp 16   Ht 5\' 8"  (1.727 m)   Wt 195 lb 12.8 oz (88.8 kg)   SpO2 99%   BMI 29.77 kg/m    Physical Exam Vitals reviewed.  Constitutional:      General: He is not in acute distress.    Appearance: Normal appearance. He is not ill-appearing.  HENT:     Head: Normocephalic and atraumatic.     Mouth/Throat:     Dentition: Abnormal dentition. Dental tenderness, gingival swelling and dental caries present.  Eyes:     Pupils: Pupils are equal, round, and reactive to light.  Cardiovascular:     Rate and Rhythm: Normal rate and regular rhythm.   Pulmonary:     Effort: Pulmonary effort is normal. No respiratory distress.  Neurological:     Mental Status: He is alert and oriented to person, place, and time.     Gait: Gait abnormal (Due to Charcot's joint of the left ankle).  Psychiatric:        Mood and Affect: Mood normal.        Behavior: Behavior normal.        Assessment/Plan: 1. 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 Continue medications as prescribed - POCT glycosylated hemoglobin (Hb A1C)  2. Acute cystitis with hematuria Urinalysis positive for UTI, cipro prescribed. Take until gone. Urine culture sent.  - POCT Urinalysis Dipstick - ciprofloxacin (CIPRO) 500 MG tablet; Take 1 tablet (500 mg total) by mouth 2 (two) times daily for 10 days. Take with food  Dispense: 20 tablet; Refill: 0 - CULTURE, URINE COMPREHENSIVE  3. Hypertension associated with diabetes (HCC) Continue benazepril as prescribed.  - benazepril (LOTENSIN) 20 MG tablet; Take 1 tablet (20 mg total) by mouth daily.  Dispense: 90 tablet; Refill: 1  4. Bilious vomiting with nausea Continue pantoprazole as prescribed.  - pantoprazole (PROTONIX) 40 MG tablet; Take 1 tablet (40 mg total) by mouth daily.  Dispense: 90 tablet; Refill: 1   General Counseling: maico wecker understanding of the findings of todays visit and agrees with plan of treatment. I have discussed any further diagnostic evaluation that may be needed or ordered today. We also reviewed his medications today. he has been encouraged to call the office with any questions or concerns that should arise related to todays visit.    Orders Placed This Encounter  Procedures   CULTURE, URINE COMPREHENSIVE   POCT glycosylated hemoglobin (Hb A1C)   POCT Urinalysis Dipstick    Meds ordered this encounter  Medications   ciprofloxacin (CIPRO) 500 MG tablet    Sig: Take 1 tablet (500 mg total) by mouth 2 (two) times daily for  10 days. Take with food  Dispense:  20 tablet    Refill:  0    Fill asap today thanks   benazepril (LOTENSIN) 20 MG tablet    Sig: Take 1 tablet (20 mg total) by mouth daily.    Dispense:  90 tablet    Refill:  1    Note increased dose, please fill new script in about 4 weeks.   pantoprazole (PROTONIX) 40 MG tablet    Sig: Take 1 tablet (40 mg total) by mouth daily.    Dispense:  90 tablet    Refill:  1    Return in about 4 months (around 09/15/2023) for F/U, Recheck A1C, [place]hai Beitler PCP.   Total time spent:30 Minutes Time spent includes review of chart, medications, test results, and follow up plan with the patient.   [place] Controlled Substance Database was reviewed by me.  This patient was seen by Sallyanne Kuster, FNP-C in collaboration with Dr. Beverely Risen as a part of collaborative care agreement.   Lovena Kluck R. Tedd Sias, MSN, FNP-C Internal medicine

## 2023-05-20 ENCOUNTER — Other Ambulatory Visit: Payer: Self-pay | Admitting: Nurse Practitioner

## 2023-05-20 DIAGNOSIS — I152 Hypertension secondary to endocrine disorders: Secondary | ICD-10-CM

## 2023-05-20 LAB — CULTURE, URINE COMPREHENSIVE

## 2023-05-20 NOTE — Telephone Encounter (Signed)
Dose has been changed.

## 2023-06-14 IMAGING — DX DG ANKLE COMPLETE 3+V*L*
3 series · 3 of 3 positions shown · non-contrast
Comparison: None Available.

CLINICAL DATA: Diabetic with swelling

EXAM:
LEFT ANKLE COMPLETE - 3+ VIEW; LEFT FOOT - COMPLETE 3+ VIEW

[ankle ap]
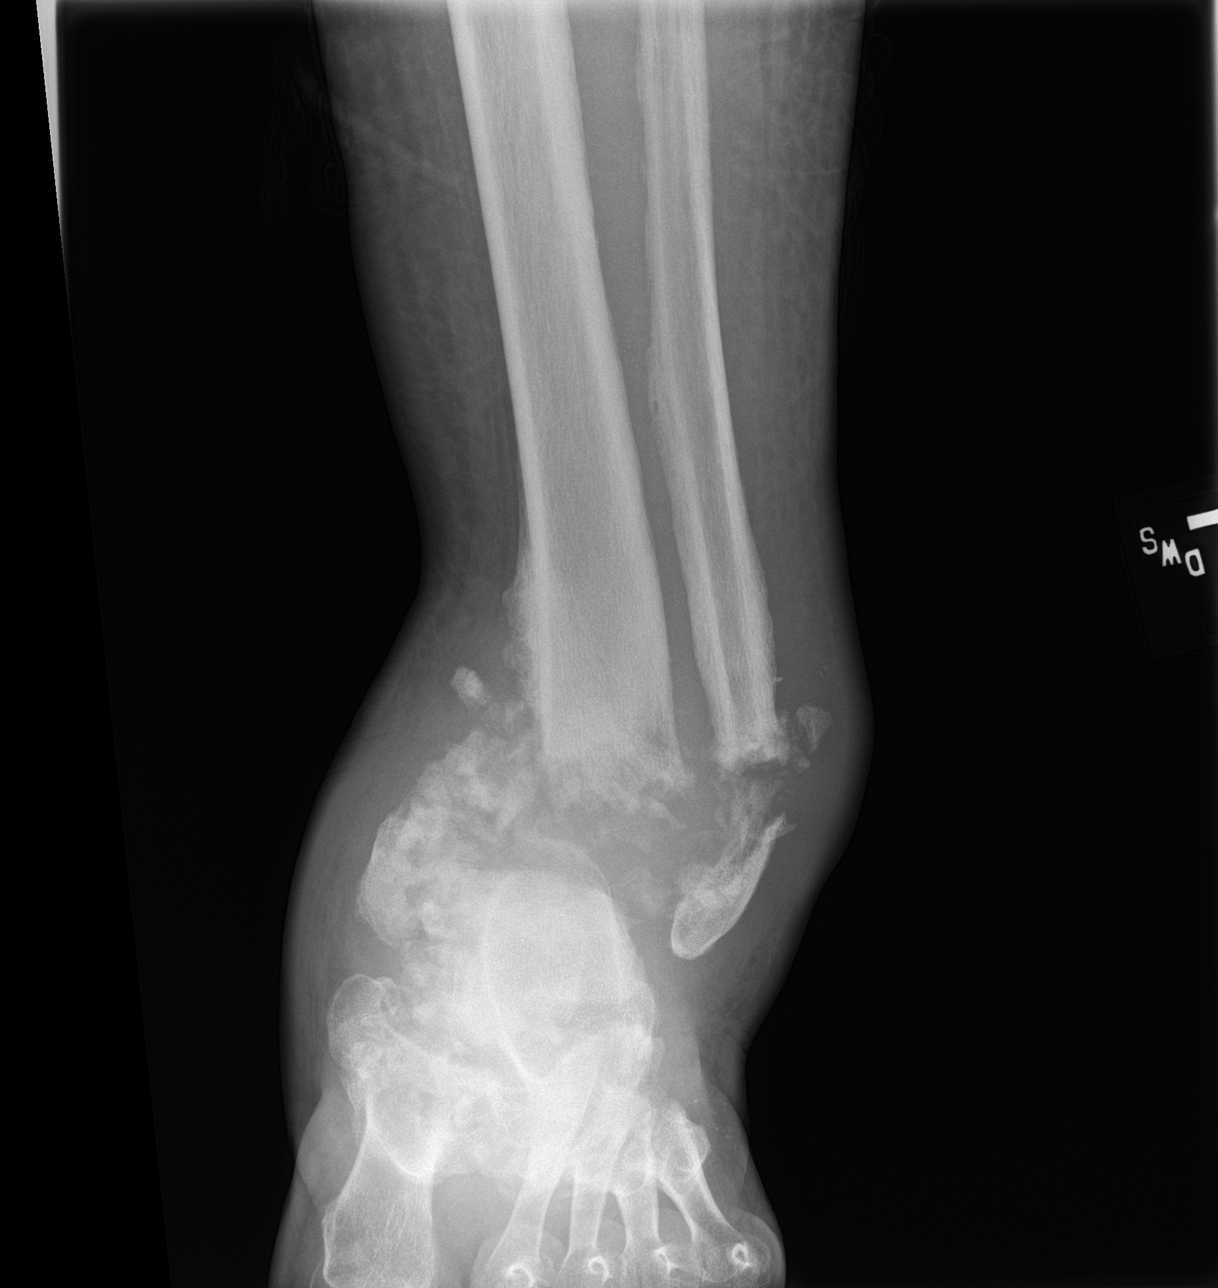

[ankle obl]
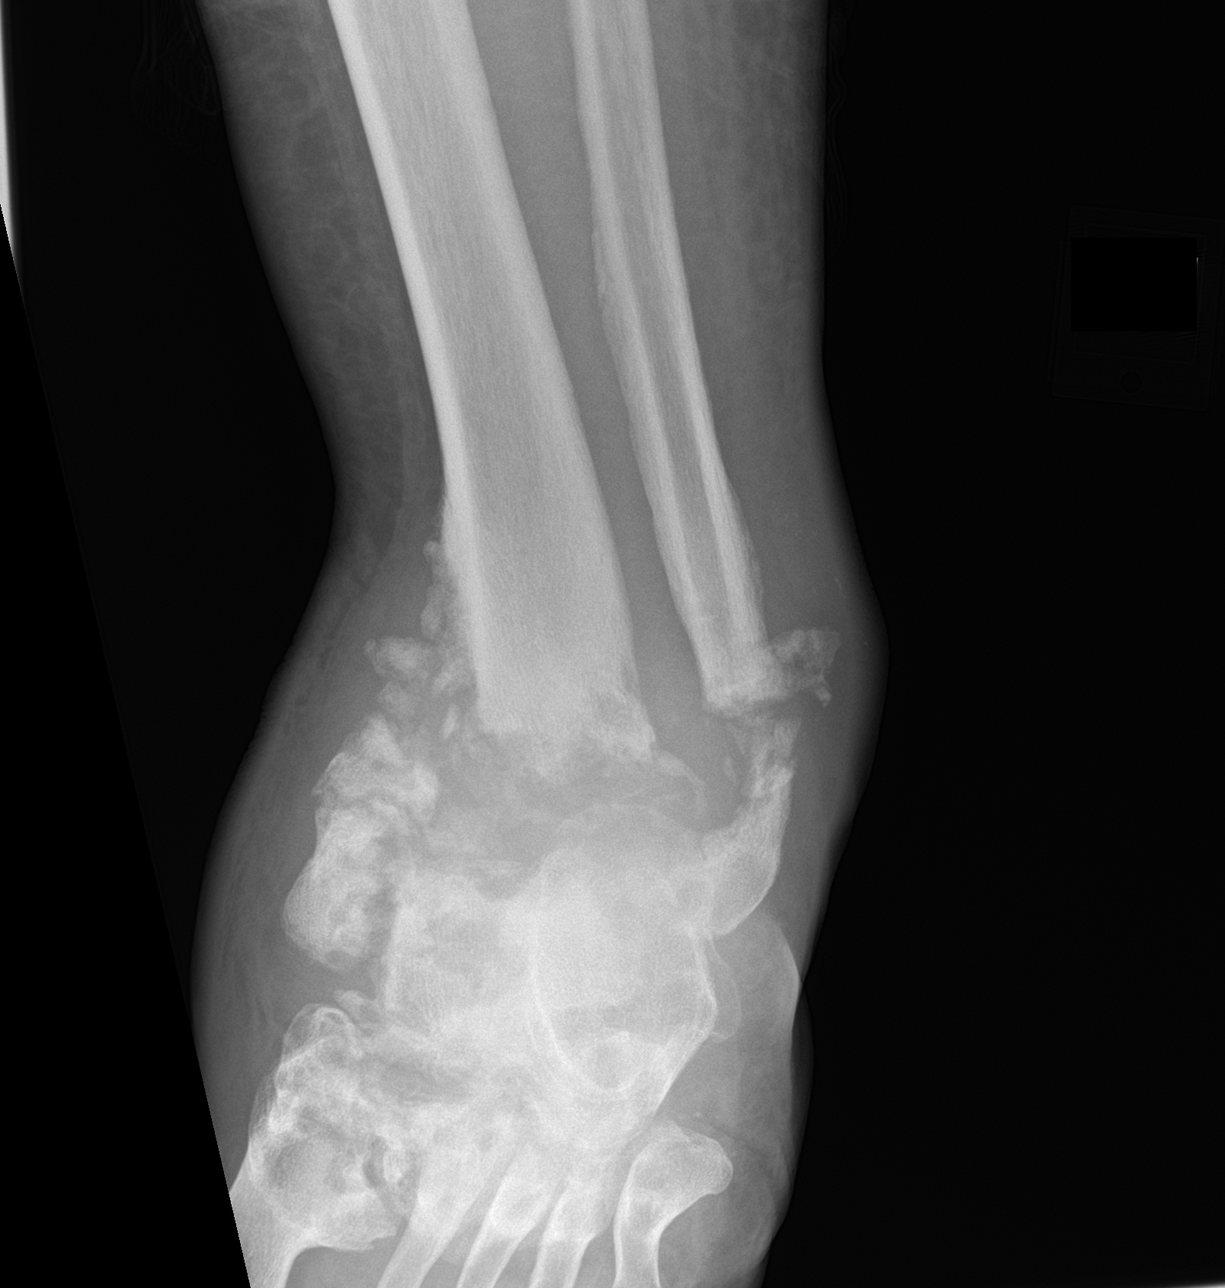

[ankle lat]
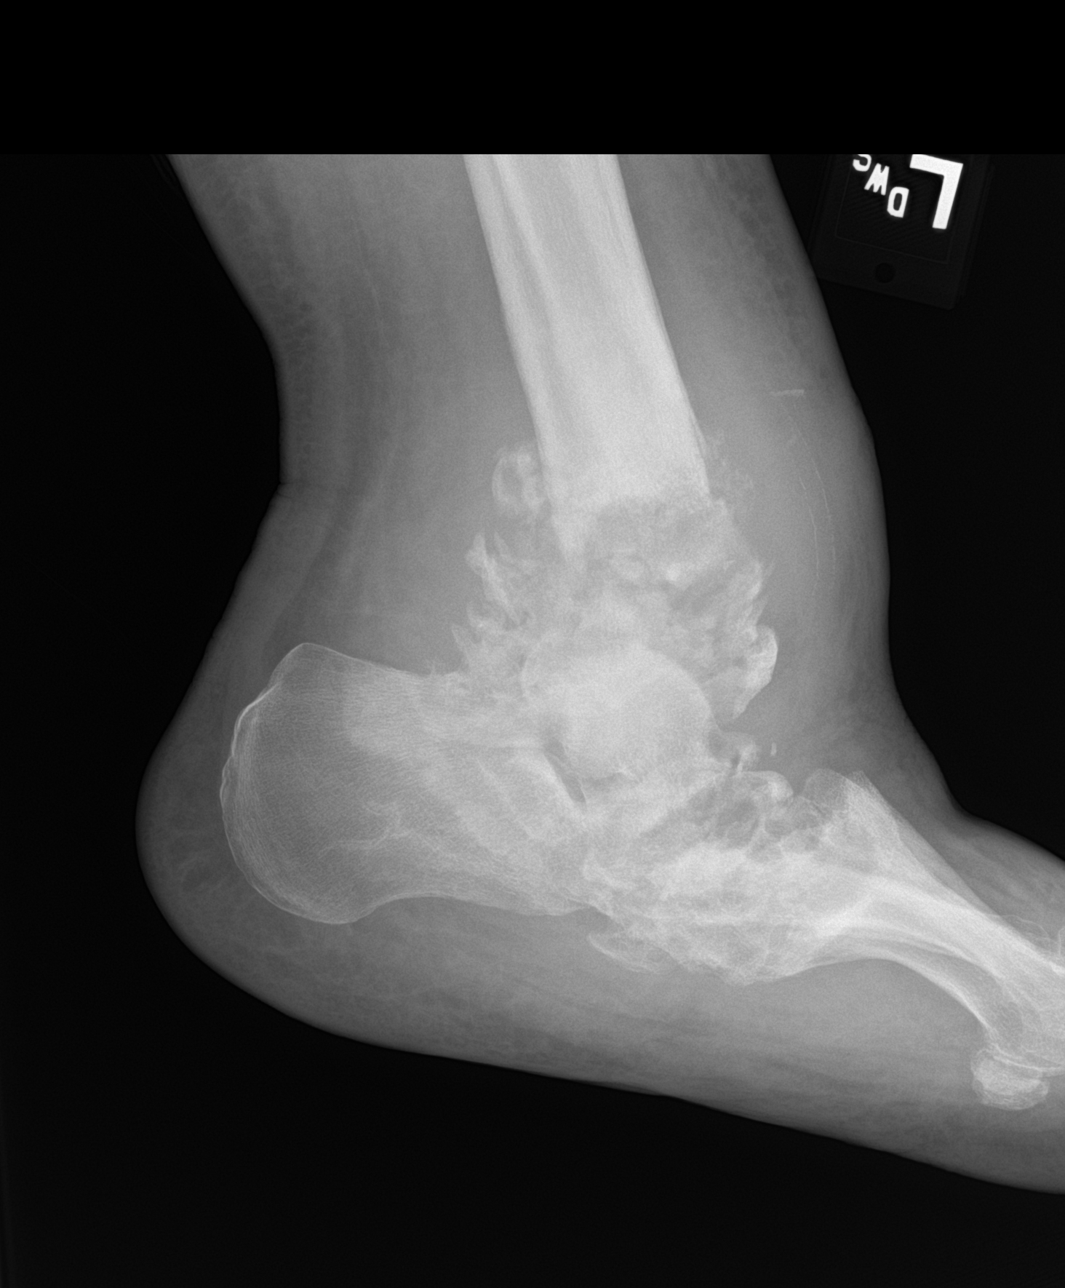

[3 of 3 positions shown; findings below may reference images not displayed]

FINDINGS: There is extensive marked bone destruction of the distal tibia,
fibula and talus with complete obliteration of the ankle joint.
Similar findings of bone destruction and distortion throughout the
midfoot involving the anterior half of the calcaneus to the proximal
metatarsals. Extensive associated callus formation/periosteal
reaction about the ankle. Severe soft tissue swelling with deformity
of the ankle and foot. Arterial vascular calcifications noted.
IMPRESSION: Extensive severe bone and joint destruction consistent with Charcot
ankle and foot.

## 2023-06-14 IMAGING — DX DG FOOT COMPLETE 3+V*L*
3 series · 3 of 3 positions shown · non-contrast
Comparison: None Available.

CLINICAL DATA: Diabetic with swelling

EXAM:
LEFT ANKLE COMPLETE - 3+ VIEW; LEFT FOOT - COMPLETE 3+ VIEW

[foot ap]
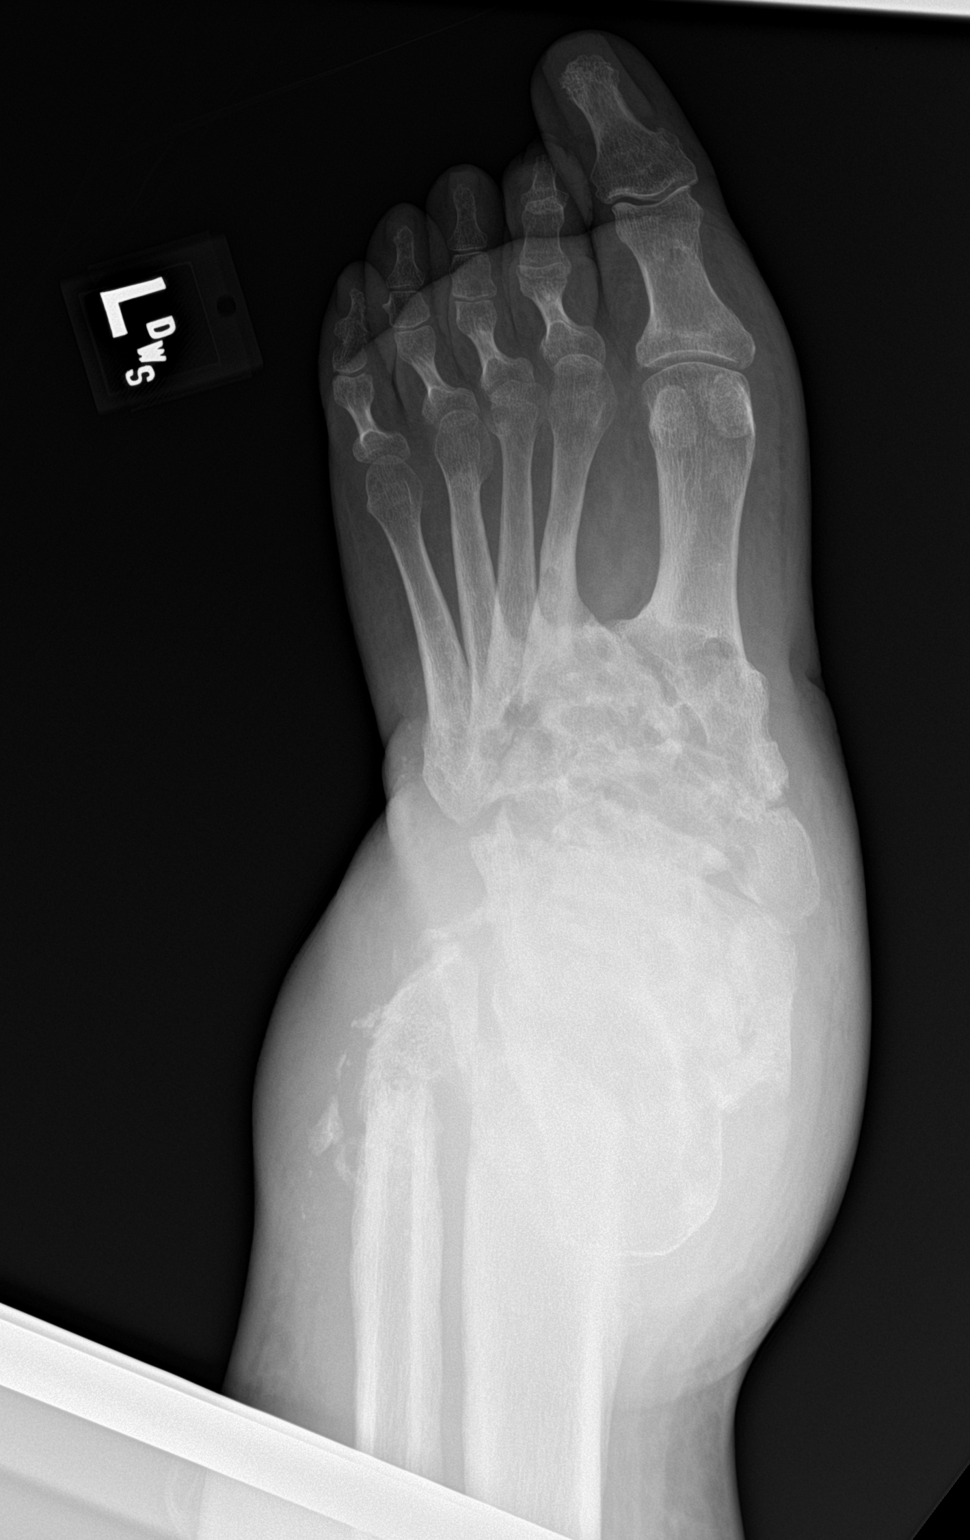

[foot lat]
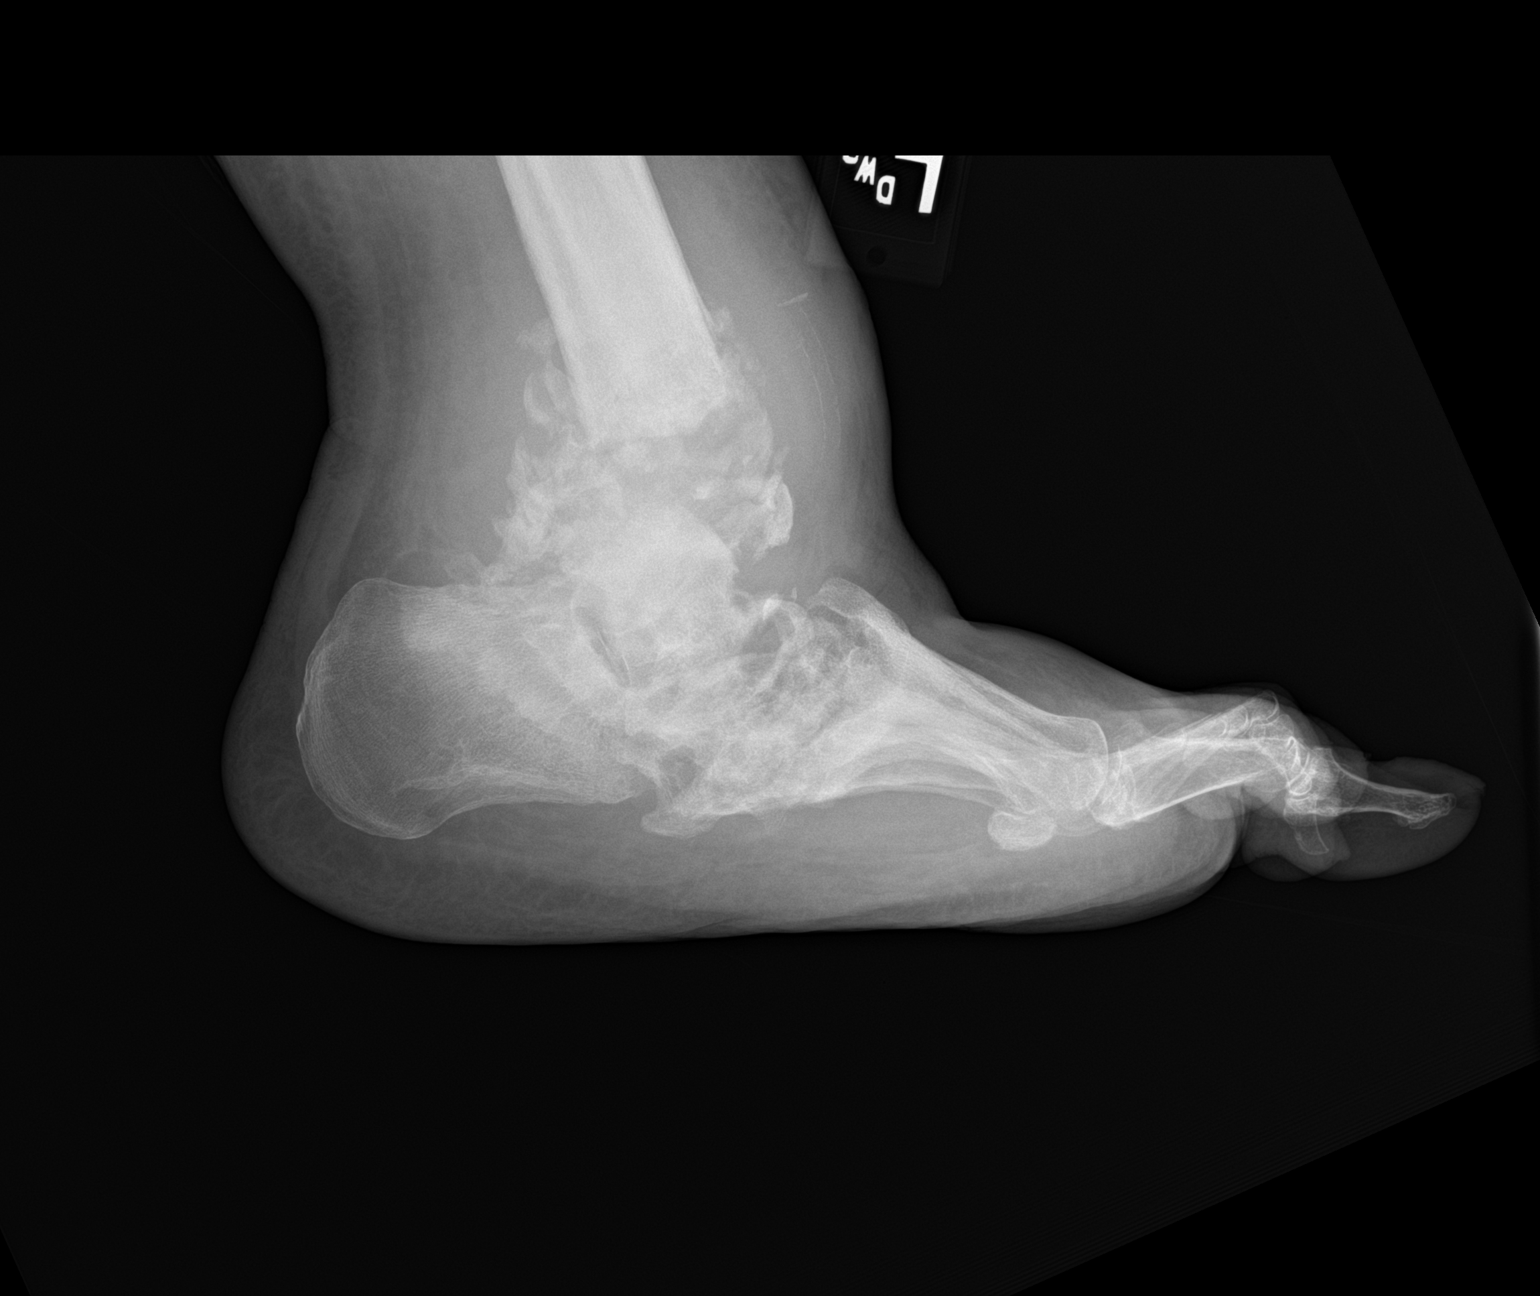

[foot obl]
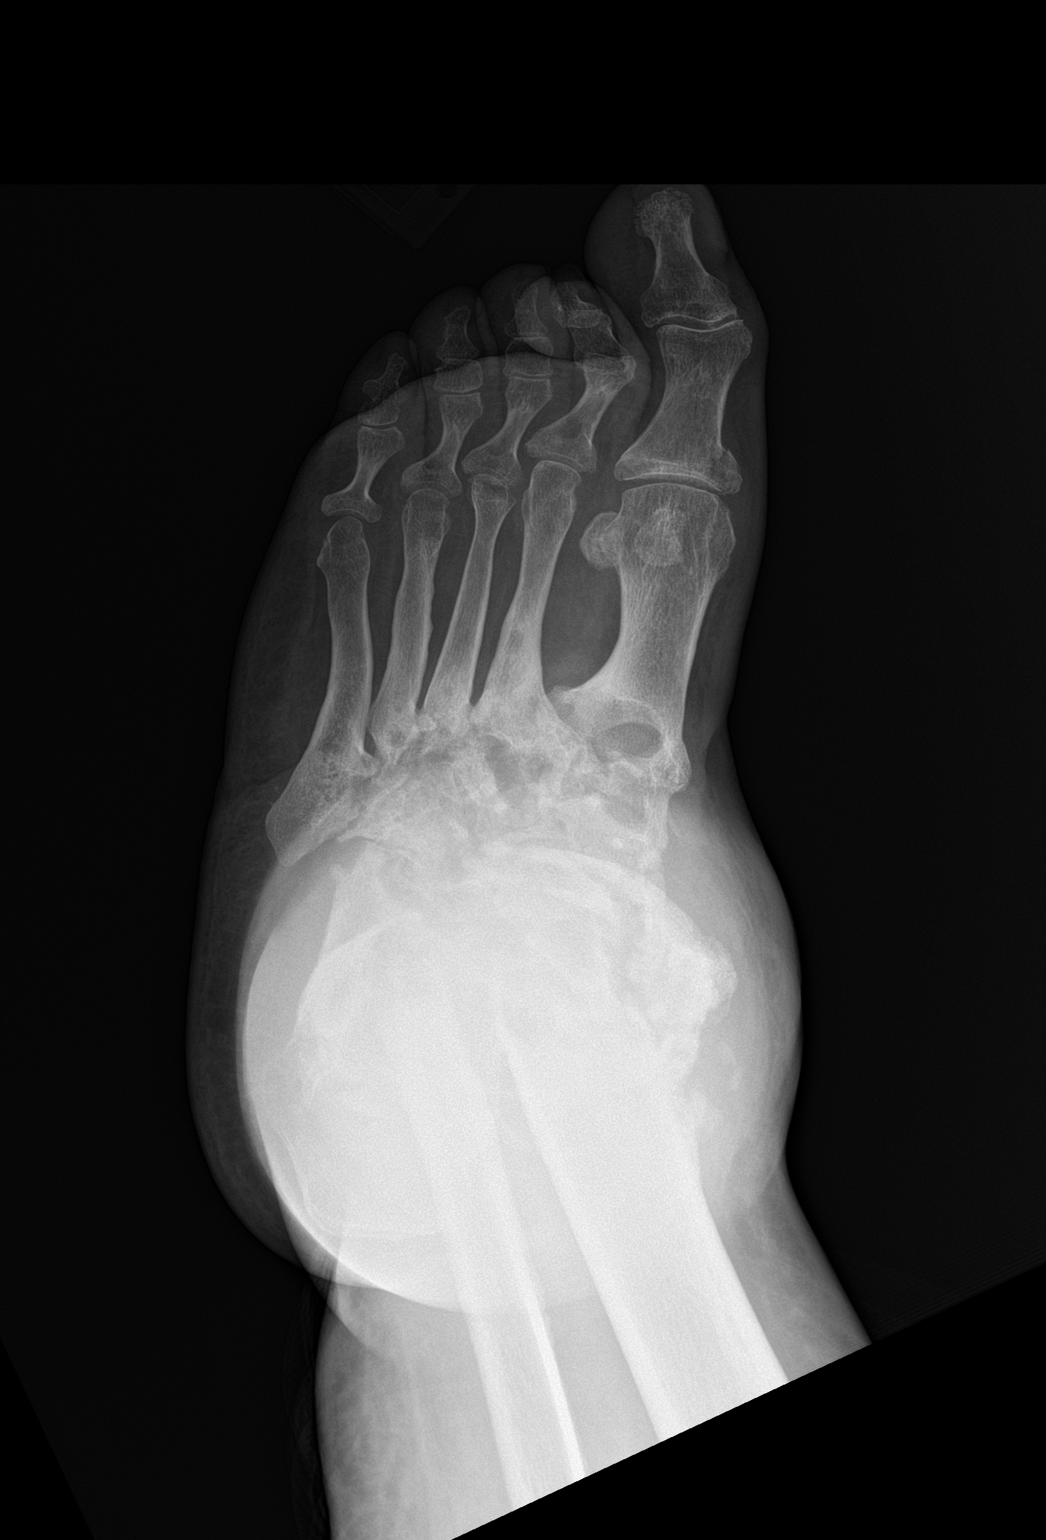

[3 of 3 positions shown; findings below may reference images not displayed]

FINDINGS: There is extensive marked bone destruction of the distal tibia,
fibula and talus with complete obliteration of the ankle joint.
Similar findings of bone destruction and distortion throughout the
midfoot involving the anterior half of the calcaneus to the proximal
metatarsals. Extensive associated callus formation/periosteal
reaction about the ankle. Severe soft tissue swelling with deformity
of the ankle and foot. Arterial vascular calcifications noted.
IMPRESSION: Extensive severe bone and joint destruction consistent with Charcot
ankle and foot.

## 2023-06-21 ENCOUNTER — Telehealth: Payer: Self-pay | Admitting: Nurse Practitioner

## 2023-06-21 NOTE — Telephone Encounter (Signed)
08/30/22-08/30/23 MR uploaded to [employer]; [email or web address]-Toni

## 2023-06-23 ENCOUNTER — Telehealth: Payer: Self-pay | Admitting: Nurse Practitioner

## 2023-06-23 NOTE — Telephone Encounter (Signed)
Patient called regarding letter for [facility] regarding his leg and kidneys. After speaking with AA, I notified patient that she will work on it tomorrow. He got hateful stating he needs it by tomorrow and that she has had over a week to get done. I explained to him that there are other patient's paperwork she is doing and that she sees patients all day long. I told AA what he said-Toni

## 2023-07-08 ENCOUNTER — Encounter: Payer: Self-pay | Admitting: Nurse Practitioner

## 2023-07-12 ENCOUNTER — Encounter: Payer: Self-pay | Admitting: Internal Medicine

## 2023-07-19 ENCOUNTER — Ambulatory Visit: Payer: Commercial Managed Care - HMO

## 2023-07-19 ENCOUNTER — Inpatient Hospital Stay (HOSPITAL_BASED_OUTPATIENT_CLINIC_OR_DEPARTMENT_OTHER): Payer: Managed Care, Other (non HMO) | Admitting: Internal Medicine

## 2023-07-19 ENCOUNTER — Inpatient Hospital Stay: Payer: Managed Care, Other (non HMO) | Attending: Internal Medicine

## 2023-07-19 ENCOUNTER — Encounter: Payer: Self-pay | Admitting: Internal Medicine

## 2023-07-19 VITALS — BP 132/86 | HR 69 | Temp 97.6°F | Wt 200.0 lb

## 2023-07-19 DIAGNOSIS — I129 Hypertensive chronic kidney disease with stage 1 through stage 4 chronic kidney disease, or unspecified chronic kidney disease: Secondary | ICD-10-CM | POA: Diagnosis present

## 2023-07-19 DIAGNOSIS — D631 Anemia in chronic kidney disease: Secondary | ICD-10-CM

## 2023-07-19 DIAGNOSIS — F17210 Nicotine dependence, cigarettes, uncomplicated: Secondary | ICD-10-CM | POA: Insufficient documentation

## 2023-07-19 DIAGNOSIS — N184 Chronic kidney disease, stage 4 (severe): Secondary | ICD-10-CM | POA: Insufficient documentation

## 2023-07-19 DIAGNOSIS — Z79899 Other long term (current) drug therapy: Secondary | ICD-10-CM | POA: Insufficient documentation

## 2023-07-19 DIAGNOSIS — F1190 Opioid use, unspecified, uncomplicated: Secondary | ICD-10-CM

## 2023-07-19 LAB — IRON AND TIBC
Iron: 105 ug/dL (ref 45–182)
Saturation Ratios: 35 % (ref 17.9–39.5)
TIBC: 302 ug/dL (ref 250–450)
UIBC: 197 ug/dL

## 2023-07-19 LAB — CBC WITH DIFFERENTIAL (CANCER CENTER ONLY)
Abs Immature Granulocytes: 0.03 10*3/uL (ref 0.00–0.07)
Basophils Absolute: 0.1 10*3/uL (ref 0.0–0.1)
Basophils Relative: 1 %
Eosinophils Absolute: 0.7 10*3/uL — ABNORMAL HIGH (ref 0.0–0.5)
Eosinophils Relative: 7 %
HCT: 32.8 % — ABNORMAL LOW (ref 39.0–52.0)
Hemoglobin: 10.7 g/dL — ABNORMAL LOW (ref 13.0–17.0)
Immature Granulocytes: 0 %
Lymphocytes Relative: 41 %
Lymphs Abs: 4.1 10*3/uL — ABNORMAL HIGH (ref 0.7–4.0)
MCH: 29.4 pg (ref 26.0–34.0)
MCHC: 32.6 g/dL (ref 30.0–36.0)
MCV: 90.1 fL (ref 80.0–100.0)
Monocytes Absolute: 0.9 10*3/uL (ref 0.1–1.0)
Monocytes Relative: 9 %
Neutro Abs: 4.2 10*3/uL (ref 1.7–7.7)
Neutrophils Relative %: 42 %
Platelet Count: 264 10*3/uL (ref 150–400)
RBC: 3.64 MIL/uL — ABNORMAL LOW (ref 4.22–5.81)
RDW: 13 % (ref 11.5–15.5)
WBC Count: 9.9 10*3/uL (ref 4.0–10.5)
nRBC: 0 % (ref 0.0–0.2)

## 2023-07-19 LAB — CMP (CANCER CENTER ONLY)
ALT: 42 U/L (ref 0–44)
AST: 36 U/L (ref 15–41)
Albumin: 3.9 g/dL (ref 3.5–5.0)
Alkaline Phosphatase: 97 U/L (ref 38–126)
Anion gap: 11 (ref 5–15)
BUN: 57 mg/dL — ABNORMAL HIGH (ref 6–20)
CO2: 22 mmol/L (ref 22–32)
Calcium: 9.1 mg/dL (ref 8.9–10.3)
Chloride: 102 mmol/L (ref 98–111)
Creatinine: 4.04 mg/dL — ABNORMAL HIGH (ref 0.61–1.24)
GFR, Estimated: 19 mL/min — ABNORMAL LOW (ref >60)
Glucose, Bld: 146 mg/dL — ABNORMAL HIGH (ref 70–99)
Potassium: 5.3 mmol/L — ABNORMAL HIGH (ref 3.5–5.1)
Sodium: 135 mmol/L (ref 135–145)
Total Bilirubin: 0.4 mg/dL (ref <1.2)
Total Protein: 8.7 g/dL — ABNORMAL HIGH (ref 6.5–8.1)

## 2023-07-19 LAB — FERRITIN: Ferritin: 96 ng/mL (ref 24–336)

## 2023-07-19 NOTE — Progress Notes (Signed)
[facility] CONSULT NOTE  Patient Care Team: Sallyanne Kuster, NP as PCP - General (Nurse Practitioner) Earna Coder, MD as Consulting Physician (Oncology)  CHIEF COMPLAINTS/PURPOSE OF CONSULTATION: ANEMIA   HEMATOLOGY HISTORY  # ANEMIA[Hb; MCV-platelets- WBC; Iron sat; ferritin;  GFR- CT/US; EGD/colonoscopy-  # CKD-IV [Dr.Lateef]  HISTORY OF PRESENTING ILLNESS: Patient ambulating-with assistance/crutches.  Accompanied by mother.  Jason Francis 32 y.o.  male pleasant patient with multiple medical problems including- hx of cocaine and marijuana abuse, chronic kidney disease stage IV/insulin-dependent diabetes; history of hep C/-today for follow-up.  Pt denies any dizziness or lightheadedness. More energy, not sleeping as much during the day now. Denies dyspnea. No blood in stool. Appetite is good.   Patient states that he is compliant with his  iron tablets. Denies any blood in stools or black or stools.  C/o needing lt foot amputated- not yet.    Review of Systems  Constitutional:  Positive for malaise/fatigue. Negative for chills, diaphoresis, fever and weight loss.  HENT:  Negative for nosebleeds and sore throat.   Eyes:  Negative for double vision.  Respiratory:  Negative for cough, hemoptysis, sputum production, shortness of breath and wheezing.   Cardiovascular:  Negative for chest pain, palpitations, orthopnea and leg swelling.  Gastrointestinal:  Negative for abdominal pain, blood in stool, constipation, diarrhea, heartburn, melena, nausea and vomiting.  Genitourinary:  Negative for dysuria, frequency and urgency.  Musculoskeletal:  Positive for back pain and joint pain.  Skin: Negative.  Negative for itching and rash.  Neurological:  Negative for dizziness, tingling, focal weakness, weakness and headaches.  Endo/Heme/Allergies:  Does not bruise/bleed easily.  Psychiatric/Behavioral:  Negative for depression. The patient is not nervous/anxious and  does not have insomnia.      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  Vaping Use   Vaping status: Former  Substance and Sexual Activity   Alcohol use: Not Currently   Drug use: Yes    Types: Marijuana, Cocaine    Comment: last stated cocaine use 07/27/22; marijuana daily   Sexual activity: Yes    Partners: Female  Other Topics Concern   Not on file  Social History Narrative   Not on file   Social Determinants of Health   Financial  Resource Strain: Low Risk  (03/01/2018)   Received from [facility], [facility]   Overall Financial Resource Strain (CARDIA)    Difficulty of Paying Living Expenses: Not hard at all  Food Insecurity: No Food Insecurity (01/21/2023)   Hunger Vital Sign    Worried About Running Out of Food in the Last Year: Never true    Ran Out of Food in the Last Year: Never true  Transportation Needs: No Transportation Needs (01/21/2023)    PRAPARE - Administrator, Civil Service (Medical): No    Lack of Transportation (Non-Medical): No  Physical Activity: Inactive (03/01/2018)   Received from [facility], [facility]   Exercise Vital Sign    Days of Exercise per Week: 0 days    Minutes of Exercise per Session: 0 min  Stress: Stress Concern Present (03/01/2018)   Received from [facility], [facility]   [employer] of Occupational Health - Occupational Stress Questionnaire    Feeling of Stress : Rather much  Social Connections: Unknown (03/01/2018)   Received from [facility], [facility]   Social Connection and Isolation Panel [NHANES]    Frequency of Communication with Friends and Family: Three times a week    Frequency of Social Gatherings with Friends and Family: Three times a week    Attends Religious Services: Never    Active Member of Clubs or Organizations: No    Attends Banker Meetings: Never    Marital Status: Not on file  Intimate Partner Violence: Not At Risk (01/21/2023)   Humiliation, Afraid, Rape, and Kick questionnaire    Fear of Current or Ex-Partner: No    Emotionally Abused: No    Physically Abused: No    Sexually Abused: No    FAMILY HISTORY: Family History  Problem Relation Age of Onset   Heart disease Mother    Hypertension Mother    Obesity Mother    Gout Mother    Thyroid disease Mother    High blood pressure Maternal Grandmother    Dementia Maternal Grandmother    Heart disease Maternal Grandfather     ALLERGIES:  is allergic to augmentin [amoxicillin-pot clavulanate] and sulfa antibiotics.  MEDICATIONS:  Current Outpatient Medications  Medication Sig Dispense Refill   amLODipine (NORVASC) 5 MG tablet Take 5 mg by mouth at bedtime.     calcitRIOL (ROCALTROL) 0.25 MCG capsule Take 0.25 mcg by mouth daily.     carvedilol (COREG) 6.25 MG tablet Take  6.25 mg by mouth 2 (two) times daily with a meal.     cloNIDine (CATAPRES) 0.1 MG tablet Take 0.1 mg by mouth 2 (two) times daily.     cyclobenzaprine (FLEXERIL) 10 MG tablet Take 1 tablet (10 mg total) by mouth 2 (two) times daily as needed for muscle spasms. 60 tablet 3   insulin NPH Human (NOVOLIN N) 100 UNIT/ML injection Inject 0.2 mLs (20 Units total) into the skin 2 (two) times daily before a meal. 10 mL 11   Insulin Pen Needle (PEN NEEDLES 31GX5/16") 31G X 8 MM MISC 1 each by Does not apply route as directed. 100 each 1   insulin regular (HUMULIN R) 100 units/mL injection Inject into skin before meals according to sliding scale. Max dose 48 units per 24 hours 10 mL 11   methadone (DOLOPHINE) 1 MG/1ML solution Take 95 mg by mouth daily. Methadone clinic  metoCLOPramide (REGLAN) 10 MG tablet TAKE 1 TABLET BY MOUTH TWICE DAILY AS NEEDED FOR NAUSEA FOR VOMITING 180 tablet 0   pantoprazole (PROTONIX) 40 MG tablet Take 1 tablet (40 mg total) by mouth daily. 90 tablet 1   sodium bicarbonate 650 MG tablet Take 650 mg by mouth 2 (two) times daily.     sodium zirconium cyclosilicate (LOKELMA) 10 g PACK packet Take 10 g by mouth daily.     No current facility-administered medications for this visit.     Marland Kitchen  PHYSICAL EXAMINATION:   Vitals:   07/19/23 1429  BP: 132/86  Pulse: 69  Temp: 97.6 F (36.4 C)    Filed Weights   07/19/23 1429  Weight: 200 lb (90.7 kg)     Physical Exam Vitals and nursing note reviewed.  HENT:     Head: Normocephalic and atraumatic.     Mouth/Throat:     Pharynx: Oropharynx is clear.  Eyes:     Extraocular Movements: Extraocular movements intact.     Pupils: Pupils are equal, round, and reactive to light.  Cardiovascular:     Rate and Rhythm: Normal rate and regular rhythm.  Pulmonary:     Comments: Decreased breath sounds bilaterally.  Abdominal:     Palpations: Abdomen is soft.  Musculoskeletal:        General: Normal range of motion.      Cervical back: Normal range of motion.  Skin:    General: Skin is warm.  Neurological:     General: No focal deficit present.     Mental Status: He is alert and oriented to person, place, and time.  Psychiatric:        Behavior: Behavior normal.        Judgment: Judgment normal.      LABORATORY DATA:  I have reviewed the data as listed Lab Results  Component Value Date   WBC 9.9 07/19/2023   HGB 10.7 (L) 07/19/2023   HCT 32.8 (L) 07/19/2023   MCV 90.1 07/19/2023   PLT 264 07/19/2023   Recent Labs    01/21/23 1454 04/18/23 1428 05/08/23 1512 07/19/23 1415  NA 131* 131* 132* 135  K 5.3* 5.4* 5.2* 5.3*  CL 99 104 105 102  CO2 21* 19* 18* 22  GLUCOSE 175* 68* 143* 146*  BUN 78* 75* 74* 57*  CREATININE 4.63* 4.53* 4.67* 4.04*  CALCIUM 8.8* 8.9 9.0 9.1  GFRNONAA 16* 17* 16* 19*  PROT 9.8* 9.1*  --  8.7*  ALBUMIN 3.5 3.9  --  3.9  AST 31 30  --  36  ALT 37 35  --  42  ALKPHOS 94 80  --  97  BILITOT <0.1* 0.4  --  0.4     No results found.  ASSESSMENT & PLAN:   Anemia of chronic kidney failure, stage 4 (severe) (HCC) # Chronic anemia-normocytic recently getting worse.  Patient is mildy symptomatic from anemia.  Etiology likely from chronic kidney disease.  May 2024-  iron saturation 12 ; ferritin 80 ; hemoglobin- 10.7- stable.  # HOLD Venofer [Poor IV access/reluctant]; continue gentle iron [iron biglycinate; 28 mg ] 2 pill a day.  Retacrit approved by insurance only at home; pt reluctant with home shots.   # IDDM- [32 years old]- on Insulin  [PCP]; HbA1c- 7.0-monitor closely.  # History of cocaine/marijuana -currently no IVDA-monitor closely for elevated blood pressures.counseled to quit.    # PVD/ Left foot s/p Dr.Dew evaluation- awaiting medical optimization holding off  surgery- stable.  # Hyperkalemia- chronic 5.4- reocmmend use lokelma [Dr.lateef]  # DISPOSITION: # HOLD venofer # follow up 4  months- MD;labs- cbc/cmp; iron studies; ferriitn; - possible  venofer- Dr.B    All questions were answered. The patient knows to call the clinic with any problems, questions or concerns.   Earna Coder, MD 07/19/2023 2:50 PM

## 2023-07-19 NOTE — Progress Notes (Signed)
Pt denies any dizziness or lightheadedness. More energy, not sleeping as much during the day now. Denies dyspnea. No blood in stool. Appetite is good.

## 2023-07-19 NOTE — Assessment & Plan Note (Signed)
#   Chronic anemia-normocytic recently getting worse.  Patient is mildy symptomatic from anemia.  Etiology likely from chronic kidney disease.  May 2024-  iron saturation 12 ; ferritin 80 ; hemoglobin- 10.7- stable.  # HOLD Venofer [Poor IV access/reluctant]; continue gentle iron [iron biglycinate; 28 mg ] 2 pill a day.  Retacrit approved by insurance only at home; pt reluctant with home shots.   # IDDM- [32 years old]- on Insulin  [PCP]; HbA1c- 7.0-monitor closely.  # History of cocaine/marijuana -currently no IVDA-monitor closely for elevated blood pressures.counseled to quit.    # PVD/ Left foot s/p Dr.Dew evaluation- awaiting medical optimization holding off surgery- stable.  # Hyperkalemia- chronic 5.4- reocmmend use lokelma [Dr.lateef]  # DISPOSITION: # HOLD venofer # follow up 4  months- MD;labs- cbc/cmp; iron studies; ferriitn; - possible venofer- Dr.B

## 2023-07-23 ENCOUNTER — Other Ambulatory Visit: Payer: Self-pay | Admitting: Nurse Practitioner

## 2023-07-23 DIAGNOSIS — N184 Chronic kidney disease, stage 4 (severe): Secondary | ICD-10-CM

## 2023-07-23 NOTE — Telephone Encounter (Signed)
Review.

## 2023-08-07 ENCOUNTER — Other Ambulatory Visit: Payer: Self-pay | Admitting: Nurse Practitioner

## 2023-08-07 DIAGNOSIS — R1114 Bilious vomiting: Secondary | ICD-10-CM

## 2023-08-31 ENCOUNTER — Encounter: Payer: Self-pay | Admitting: Internal Medicine

## 2023-09-01 ENCOUNTER — Other Ambulatory Visit (INDEPENDENT_AMBULATORY_CARE_PROVIDER_SITE_OTHER): Payer: Self-pay | Admitting: Nurse Practitioner

## 2023-09-01 DIAGNOSIS — N184 Chronic kidney disease, stage 4 (severe): Secondary | ICD-10-CM

## 2023-09-07 ENCOUNTER — Ambulatory Visit (INDEPENDENT_AMBULATORY_CARE_PROVIDER_SITE_OTHER): Payer: Commercial Managed Care - HMO

## 2023-09-07 DIAGNOSIS — N184 Chronic kidney disease, stage 4 (severe): Secondary | ICD-10-CM

## 2023-09-09 ENCOUNTER — Ambulatory Visit (INDEPENDENT_AMBULATORY_CARE_PROVIDER_SITE_OTHER): Payer: Managed Care, Other (non HMO) | Admitting: Vascular Surgery

## 2023-09-15 ENCOUNTER — Ambulatory Visit: Payer: Commercial Managed Care - HMO | Admitting: Nurse Practitioner

## 2023-09-20 ENCOUNTER — Ambulatory Visit: Payer: Commercial Managed Care - HMO | Admitting: Nurse Practitioner

## 2023-09-29 ENCOUNTER — Encounter: Payer: Self-pay | Admitting: Nurse Practitioner

## 2023-09-29 ENCOUNTER — Ambulatory Visit: Payer: Commercial Managed Care - HMO | Admitting: Nurse Practitioner

## 2023-09-29 VITALS — BP 128/81 | HR 66 | Temp 98.1°F | Resp 16 | Ht 68.0 in | Wt 203.8 lb

## 2023-09-29 DIAGNOSIS — N184 Chronic kidney disease, stage 4 (severe): Secondary | ICD-10-CM

## 2023-09-29 DIAGNOSIS — E1022 Type 1 diabetes mellitus with diabetic chronic kidney disease: Secondary | ICD-10-CM

## 2023-09-29 DIAGNOSIS — S161XXA Strain of muscle, fascia and tendon at neck level, initial encounter: Secondary | ICD-10-CM

## 2023-09-29 DIAGNOSIS — I152 Hypertension secondary to endocrine disorders: Secondary | ICD-10-CM | POA: Diagnosis not present

## 2023-09-29 DIAGNOSIS — E1159 Type 2 diabetes mellitus with other circulatory complications: Secondary | ICD-10-CM | POA: Diagnosis not present

## 2023-09-29 LAB — POCT GLYCOSYLATED HEMOGLOBIN (HGB A1C): Hemoglobin A1C: 7.4 % — AB (ref 4.0–5.6)

## 2023-09-29 MED ORDER — CYCLOBENZAPRINE HCL 10 MG PO TABS: 10.0000 mg | ORAL_TABLET | Freq: Two times a day (BID) | ORAL | 3 refills | Status: DC | PRN

## 2023-09-29 MED ORDER — INSULIN LISPRO 100 UNIT/ML IJ SOLN: INTRAMUSCULAR | 11 refills | Status: DC

## 2023-09-29 MED ORDER — OMNIPOD 5 DEXG7G6 PODS GEN 5 MISC: 1.0000 | 11 refills | Status: DC

## 2023-09-29 MED ORDER — DEXCOM G7 SENSOR MISC: 11 refills | Status: DC

## 2023-09-29 MED ORDER — OMNIPOD 5 DEXG7G6 INTRO GEN 5 KIT: 1.0000 | PACK | Freq: Once | 0 refills | Status: AC

## 2023-09-29 NOTE — Progress Notes (Unsigned)
[facility] [address] [place], [place] [address]  Internal MEDICINE  Office Visit Note  Patient Name: Jason Francis  [id number]  [id number]  Date of Service: 09/29/2023  Chief Complaint  Patient presents with   Diabetes   Gastroesophageal Reflux   Hypertension   Follow-up    HPI Jason Francis presents for a follow-up visit for diabetes, CKD,  and hypertension Diabetes -- A1c is elevated at 7.4. has had to go up on his insulin dose. He has not tried an insulin pump or CGM before but is interested in trying them if his insurance will cover it.  Hypertension -- BP is controlled with current medications CKD -- managed and monitored by nephrology. Reports that he will be getting a graft or AV fistula in preparation for starting dialysis in the future.     Current Medication: Outpatient Encounter Medications as of 09/29/2023  Medication Sig   amLODipine (NORVASC) 5 MG tablet Take 5 mg by mouth at bedtime.   calcitRIOL (ROCALTROL) 0.25 MCG capsule Take 0.25 mcg by mouth daily.   carvedilol (COREG) 6.25 MG tablet Take 6.25 mg by mouth 2 (two) times daily with a meal.   cloNIDine (CATAPRES) 0.1 MG tablet Take 0.1 mg by mouth 2 (two) times daily.   Continuous Glucose Sensor (DEXCOM G7 SENSOR) MISC Use one every 10 days for uncontrolled dm   Insulin Disposable Pump (OMNIPOD 5 DEXG7G6 INTRO GEN 5) KIT 1 kit by Does not apply route once for 1 dose.   Insulin Disposable Pump (OMNIPOD 5 DEXG7G6 PODS GEN 5) MISC 1 Device by Subcutaneous Infusion route every 3 (three) days.   insulin lispro (HUMALOG) 100 UNIT/ML injection Use insulin with omnipod, 200 units maximum every 72 hours   insulin NPH Human (NOVOLIN N) 100 UNIT/ML injection Inject 0.2 mLs (20 Units total) into the skin 2 (two) times daily before a meal.   Insulin Pen Needle (PEN NEEDLES 31GX5/16") 31G X 8 MM MISC 1 each by Does not apply route as directed.   insulin regular (HUMULIN R) 100 units/mL injection INJECT INTO SKIN  BEFORE MEALS ACCORDING TO SLIDING SCALE - MAX DOSE 48 UNITS IN 24 HOURS   methadone (DOLOPHINE) 1 MG/1ML solution Take 95 mg by mouth daily. Methadone clinic   metoCLOPramide (REGLAN) 10 MG tablet TAKE 1 TABLET BY MOUTH TWICE DAILY AS NEEDED FOR NAUSEA OR VOMITING   pantoprazole (PROTONIX) 40 MG tablet Take 1 tablet (40 mg total) by mouth daily.   sodium bicarbonate 650 MG tablet Take 650 mg by mouth 2 (two) times daily.   sodium zirconium cyclosilicate (LOKELMA) 10 g PACK packet Take 10 g by mouth daily.   [DISCONTINUED] cyclobenzaprine (FLEXERIL) 10 MG tablet Take 1 tablet (10 mg total) by mouth 2 (two) times daily as needed for muscle spasms.   cyclobenzaprine (FLEXERIL) 10 MG tablet Take 1 tablet (10 mg total) by mouth 2 (two) times daily as needed for muscle spasms.   No facility-administered encounter medications on file as of 09/29/2023.    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)     Family History: Family History  Problem Relation Age of Onset   Heart disease Mother    Hypertension Mother    Obesity Mother    Gout Mother    Thyroid disease Mother    High blood pressure Maternal Grandmother    Dementia Maternal Grandmother    Heart disease Maternal Grandfather     Social History   Socioeconomic History   Marital status: Single    Spouse name: Not on file   Number of children: Not on file    Years of education: Not on file   Highest education level: Not on file  Occupational History   Not on file  Tobacco Use   Smoking status: Every Day    Current packs/day: 2.00    Types: Cigarettes   Smokeless tobacco: Never   Tobacco comments:    1/2 pack daily.  Vaping Use   Vaping status: Former  Substance and Sexual Activity   Alcohol use: Not Currently   Drug use: Yes    Types: Marijuana, Cocaine    Comment: last stated cocaine use 07/27/22; marijuana daily   Sexual activity: Yes    Partners: Female  Other Topics Concern   Not on file  Social History Narrative   Not on file   Social Drivers of Health   Financial Resource Strain: Low Risk  (03/01/2018)   Received from [facility], [facility]   Overall Financial Resource Strain (CARDIA)    Difficulty of Paying Living Expenses: Not hard at all  Food Insecurity: No Food Insecurity (01/21/2023)   Hunger Vital Sign    Worried About Running Out of Food in the Last Year: Never true    Ran Out of Food in the Last Year: Never true  Transportation Needs: No Transportation Needs (01/21/2023)   PRAPARE - Administrator, Civil Service (Medical): No    Lack of Transportation (Non-Medical): No  Physical Activity: Inactive (03/01/2018)   Received from [facility], [facility]   Exercise Vital Sign    Days of Exercise per Week: 0 days    Minutes of Exercise per Session: 0 min  Stress: Stress Concern Present (03/01/2018)   Received from [facility], [facility]   [employer] of Occupational Health - Occupational Stress Questionnaire    Feeling of Stress : Rather much  Social Connections: Unknown (03/01/2018)   Received from [facility], [facility]   Social Connection and Isolation Panel [NHANES]    Frequency of Communication with Friends and Family: Three times a week    Frequency  of Social Gatherings with Friends and Family: Three times a week    Attends Religious Services: Never    Active Member of Clubs or Organizations: No    Attends Banker Meetings: Never    Marital Status: Not on file  Intimate Partner Violence: Not At Risk (01/21/2023)   Humiliation, Afraid, Rape, and Kick questionnaire    Fear of Current or Ex-Partner: No    Emotionally Abused: No    Physically Abused: No    Sexually Abused: No      Review of Systems  Constitutional:  Negative  for chills, fatigue and unexpected weight change.  HENT:  Positive for dental problem and postnasal drip. Negative for congestion, rhinorrhea, sneezing and sore throat.   Eyes:  Negative for redness.  Respiratory: Negative.  Negative for cough, chest tightness, shortness of breath and wheezing.   Cardiovascular: Negative.  Negative for chest pain and palpitations.  Gastrointestinal:  Negative for abdominal pain, constipation, diarrhea, nausea and vomiting.  Genitourinary:  Negative for dysuria and frequency.  Musculoskeletal:  Positive for arthralgias (Left neck pain), gait problem (Due to Charcot's joint of the left ankle, ambulates with crutches) and neck pain (Left side). Negative for back pain and joint swelling.  Skin: Negative.  Negative for rash.  Neurological:  Negative for tremors and numbness.  Hematological:  Negative for adenopathy. Does not bruise/bleed easily.  Psychiatric/Behavioral:  Negative for behavioral problems (Depression), sleep disturbance and suicidal ideas. The patient is not nervous/anxious.     Vital Signs: BP 128/81   Pulse 66   Temp 98.1 F (36.7 C)   Resp 16   Ht 5\' 8"  (1.727 m)   Wt 203 lb 12.8 oz (92.4 kg)   SpO2 98%   BMI 30.99 kg/m    Physical Exam Vitals reviewed.  Constitutional:      General: He is not in acute distress.    Appearance: Normal appearance. He is not ill-appearing.  HENT:     Head: Normocephalic and atraumatic.     Mouth/Throat:      Dentition: Abnormal dentition. Dental tenderness, gingival swelling and dental caries present.  Eyes:     Pupils: Pupils are equal, round, and reactive to light.  Cardiovascular:     Rate and Rhythm: Normal rate and regular rhythm.  Pulmonary:     Effort: Pulmonary effort is normal. No respiratory distress.  Neurological:     Mental Status: He is alert and oriented to person, place, and time.     Gait: Gait abnormal (Due to Charcot's joint of the left ankle).  Psychiatric:        Mood and Affect: Mood normal.        Behavior: Behavior normal.        Assessment/Plan: 1. 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 Will try to get omnipod and dexcom approved for patient and see if this will be a feasible method of diabetes management for him. His A1c increased some compared to last A1c, it is greater than 7.0.  - POCT glycosylated hemoglobin (Hb A1C) - Continuous Glucose Sensor (DEXCOM G7 SENSOR) MISC; Use one every 10 days for uncontrolled dm  Dispense: 3 each; Refill: 11 - Insulin Disposable Pump (OMNIPOD 5 DEXG7G6 PODS GEN 5) MISC; 1 Device by Subcutaneous Infusion route every 3 (three) days.  Dispense: 10 each; Refill: 11 - insulin lispro (HUMALOG) 100 UNIT/ML injection; Use insulin with omnipod, 200 units maximum every 72 hours  Dispense: 10 mL; Refill: 11 - Insulin Disposable Pump (OMNIPOD 5 DEXG7G6 INTRO GEN 5) KIT; 1 kit by Does not apply route once for 1 dose.  Dispense: 1 kit; Refill: 0  2. Hypertension associated with diabetes (HCC) Continue medications as prescribed.   3. Strain of neck muscle, initial encounter Continue prn cyclobenzaprine as prescribed  - cyclobenzaprine (FLEXERIL) 10 MG tablet; Take 1 tablet (10 mg total) by mouth 2 (two) times daily as needed for muscle spasms.  Dispense: 60 tablet; Refill: 3   General Counseling: Jason Francis understanding of the findings  of todays visit  and agrees with plan of treatment. I have discussed any further diagnostic evaluation that may be needed or ordered today. We also reviewed his medications today. he has been encouraged to call the office with any questions or concerns that should arise related to todays visit.    Orders Placed This Encounter  Procedures   POCT glycosylated hemoglobin (Hb A1C)    Meds ordered this encounter  Medications   cyclobenzaprine (FLEXERIL) 10 MG tablet    Sig: Take 1 tablet (10 mg total) by mouth 2 (two) times daily as needed for muscle spasms.    Dispense:  60 tablet    Refill:  3    For future refills   Continuous Glucose Sensor (DEXCOM G7 SENSOR) MISC    Sig: Use one every 10 days for uncontrolled dm    Dispense:  3 each    Refill:  11    Dx code E10.22   Insulin Disposable Pump (OMNIPOD 5 DEXG7G6 PODS GEN 5) MISC    Sig: 1 Device by Subcutaneous Infusion route every 3 (three) days.    Dispense:  10 each    Refill:  11    Dx E10.22   insulin lispro (HUMALOG) 100 UNIT/ML injection    Sig: Use insulin with omnipod, 200 units maximum every 72 hours    Dispense:  10 mL    Refill:  11    Dx code E10.22   Insulin Disposable Pump (OMNIPOD 5 DEXG7G6 INTRO GEN 5) KIT    Sig: 1 kit by Does not apply route once for 1 dose.    Dispense:  1 kit    Refill:  0    Dx code E10.22    Return in about 2 months (around 12/07/2023) for F/U, Melondy Blanchard PCP omnipod/dexcom .   Total time spent:30 Minutes Time spent includes review of chart, medications, test results, and follow up plan with the patient.   [place] Controlled Substance Database was reviewed by me.  This patient was seen by Sallyanne Kuster, FNP-C in collaboration with Dr. Beverely Risen as a part of collaborative care agreement.   Ayda Tancredi R. Tedd Sias, MSN, FNP-C Internal medicine

## 2023-10-04 ENCOUNTER — Ambulatory Visit (INDEPENDENT_AMBULATORY_CARE_PROVIDER_SITE_OTHER): Payer: Commercial Managed Care - HMO | Admitting: Vascular Surgery

## 2023-10-04 ENCOUNTER — Encounter (INDEPENDENT_AMBULATORY_CARE_PROVIDER_SITE_OTHER): Payer: Self-pay | Admitting: Vascular Surgery

## 2023-10-04 ENCOUNTER — Encounter: Payer: Self-pay | Admitting: Nurse Practitioner

## 2023-10-04 VITALS — BP 133/84 | HR 72 | Resp 16 | Wt 197.6 lb

## 2023-10-04 DIAGNOSIS — N184 Chronic kidney disease, stage 4 (severe): Secondary | ICD-10-CM | POA: Insufficient documentation

## 2023-10-04 DIAGNOSIS — E10618 Type 1 diabetes mellitus with other diabetic arthropathy: Secondary | ICD-10-CM | POA: Diagnosis not present

## 2023-10-04 DIAGNOSIS — K219 Gastro-esophageal reflux disease without esophagitis: Secondary | ICD-10-CM | POA: Insufficient documentation

## 2023-10-04 DIAGNOSIS — M86372 Chronic multifocal osteomyelitis, left ankle and foot: Secondary | ICD-10-CM | POA: Diagnosis not present

## 2023-10-04 DIAGNOSIS — I10 Essential (primary) hypertension: Secondary | ICD-10-CM | POA: Diagnosis not present

## 2023-10-04 NOTE — Assessment & Plan Note (Signed)
 blood pressure control important in reducing the progression of atherosclerotic disease. On appropriate oral medications.

## 2023-10-04 NOTE — Progress Notes (Signed)
 MRN : [id number]  Jason Francis is a 33 y.o. (02-17-1991) male who presents with chief complaint of  Chief Complaint  Patient presents with   New Patient (Initial Visit)    Ref lateef consult vein mapp. CKD4. AVF placement   .  History of Present Illness: Patient returns today in follow up on referral from his nephrologist for access placement.  He has CKD stage IV nearing dialysis dependence likely from his type 1 diabetes.  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  He was scheduled for left below-knee amputation but had issues with positive drug screens and this was never performed.  This is still pending.  At this point, he is not on dialysis yet, but it is expected he will transition to dialysis sometime in the next several months.  His nephrologist astutely wanted to get his fistula in place prior to any dialysis initiation.  This particular important with his chronic osteomyelitis and extremely high risk of catheter infection should he have a PermCath placed.  He is right-hand dominant.  He does have signs of previous needle marks at his left antecubital fossa from previous illicit drug use. His noninvasive studies showed normal arterial studies on each side.  His cephalic vein is marginal with a decent size basilic vein on each side as well.   Current Outpatient Medications  Medication Sig Dispense Refill   amLODipine  (NORVASC ) 5 MG tablet Take 5 mg by mouth at bedtime.     calcitRIOL (ROCALTROL) 0.25 MCG capsule Take 0.25 mcg by mouth daily.     carvedilol (COREG) 6.25 MG tablet Take 6.25 mg by mouth 2 (two) times daily with a meal.     cloNIDine (CATAPRES) 0.1 MG tablet Take 0.1 mg by mouth 2 (two) times daily.     Continuous Glucose Sensor (DEXCOM G7 SENSOR) MISC Use one every 10 days for uncontrolled dm 3 each 11   cyclobenzaprine  (FLEXERIL ) 10 MG tablet Take 1 tablet (10 mg total) by mouth 2 (two) times daily as needed for  muscle spasms. 60 tablet 3   Insulin  Disposable Pump (OMNIPOD 5 DEXG7G6 PODS GEN 5) MISC 1 Device by Subcutaneous Infusion route every 3 (three) days. 10 each 11   insulin  lispro (HUMALOG ) 100 UNIT/ML injection Use insulin  with omnipod, 200 units maximum every 72 hours 10 mL 11   insulin  NPH Human (NOVOLIN  N) 100 UNIT/ML injection Inject 0.2 mLs (20 Units total) into the skin 2 (two) times daily before a meal. 10 mL 11   Insulin  Pen Needle (PEN NEEDLES 31GX5/16) 31G X 8 MM MISC 1 each by Does not apply route as directed. 100 each 1   insulin  regular (HUMULIN R ) 100 units/mL injection INJECT INTO SKIN BEFORE MEALS ACCORDING TO SLIDING SCALE - MAX DOSE 48 UNITS IN 24 HOURS 20 mL 0   methadone  (DOLOPHINE ) 1 MG/1ML solution Take 95 mg by mouth daily. Methadone  clinic     metoCLOPramide  (REGLAN ) 10 MG tablet TAKE 1 TABLET BY MOUTH TWICE DAILY AS NEEDED FOR NAUSEA OR VOMITING 180 tablet 0   pantoprazole  (PROTONIX ) 40 MG tablet Take 1 tablet (40 mg total) by mouth daily. 90 tablet 1   sodium bicarbonate 650 MG tablet Take 650 mg by mouth 2 (two) times daily.     sodium zirconium cyclosilicate (LOKELMA) 10 g PACK packet Take 10 g by mouth daily.     No current facility-administered medications for this visit.  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)    CKD (chronic kidney disease), stage IV (HCC)    Diabetic Charcot foot (HCC)    GERD (gastroesophageal reflux disease)    Hepatitis C    Hypertension    MRSA infection    in blood per patient   Nausea & vomiting 02/14/2022   Polysubstance abuse (HCC)    a.) THC + cocaine + TCA   Secondary hyperparathyroidism (HCC)    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)     Past Surgical History:  Procedure Laterality Date   APPENDECTOMY     FOOT SURGERY Left 2019     Social History   Tobacco Use   Smoking status: Every Day    Current packs/day: 2.00    Types:  Cigarettes   Smokeless tobacco: Never   Tobacco comments:    1/2 pack daily.  Vaping Use   Vaping status: Former  Substance Use Topics   Alcohol use: Not Currently   Drug use: Yes    Types: Marijuana, Cocaine    Comment: last stated cocaine use 07/27/22; marijuana daily      Family History  Problem Relation Age of Onset   Heart disease Mother    Hypertension Mother    Obesity Mother    Gout Mother    Thyroid disease Mother    High blood pressure Maternal Grandmother    Dementia Maternal Grandmother    Heart disease Maternal Grandfather     Allergies  Allergen Reactions   Augmentin [Amoxicillin-Pot Clavulanate] Nausea And Vomiting   Sulfa Antibiotics Nausea And Vomiting     REVIEW OF SYSTEMS (Negative unless checked)  Constitutional: [] Weight loss  [] Fever  [] Chills Cardiac: [] Chest pain   [] Chest pressure   [] Palpitations   [] Shortness of breath when laying flat   [] Shortness of breath at rest   [] Shortness of breath with exertion. Vascular:  [] Pain in legs with walking   [] Pain in legs at rest   [] Pain in legs when laying flat   [] Claudication   [] Pain in feet when walking  [] Pain in feet at rest  [] Pain in feet when laying flat   [] History of DVT   [] Phlebitis   [] Swelling in legs   [] Varicose veins   [] Non-healing ulcers Pulmonary:   [] Uses home oxygen   [] Productive cough   [] Hemoptysis   [] Wheeze  [] COPD   [] Asthma Neurologic:  [] Dizziness  [] Blackouts   [] Seizures   [] History of stroke   [] History of TIA  [] Aphasia   [] Temporary blindness   [] Dysphagia   [] Weakness or numbness in arms   [] Weakness or numbness in legs Musculoskeletal:  [] Arthritis   [] Joint swelling   [x] Joint pain   [] Low back pain Hematologic:  [] Easy bruising  [] Easy bleeding   [] Hypercoagulable state   [x] Anemic   Gastrointestinal:  [] Blood in stool   [] Vomiting blood  [x] Gastroesophageal reflux/heartburn   [] Abdominal pain Genitourinary:  [x] Chronic kidney disease   [] Difficult urination   [] Frequent urination  [] Burning with urination   [] Hematuria Skin:  [] Rashes   [] Ulcers   [] Wounds Psychological:  [] History of anxiety   []  History of major depression.  Physical Examination  BP 133/84   Pulse 72   Resp 16   Wt 197 lb 9.6 oz (89.6 kg)   BMI 30.04 kg/m  Gen:  WD/WN, NAD. Appears older than stated age.  Head: [place]/AT, No temporalis wasting.  Ear/Nose/Throat: Hearing grossly intact, nares w/o erythema or drainage. Dentition poor. Eyes: Conjunctiva clear. Sclera non-icteric Neck: Supple.  Trachea midline Pulmonary:  Good air movement, no use of accessory muscles.  Cardiac: RRR, no JVD Vascular:  Vessel Right Left  Radial Palpable Palpable           Musculoskeletal: M/S 5/5 throughout.  No deformity or atrophy. Charcot changes in the left foot and ankle are severe. Mild LE edema.  Has track marks overlying his cephalic vein at the left antecubital fossa although the vein underneath was patent on duplex recently.  Allen's test is normal. Neurologic: Sensation grossly intact in extremities.  Symmetrical.  Speech is fluent.  Psychiatric: Judgment intact, Mood & affect appropriate for pt's clinical situation. Dermatologic: No rashes or ulcers noted.  No cellulitis or open wounds.      Labs Recent Results (from the past 2160 hours)  Ferritin     Status: None   Collection Time: 07/19/23  2:15 PM  Result Value Ref Range   Ferritin 96 24 - 336 ng/mL    Comment: Performed at [facility], [address] [place], [place] [address]  Iron  and TIBC     Status: None   Collection Time: 07/19/23  2:15 PM  Result Value Ref Range   Iron  105 45 - 182 ug/dL    Comment: HEMOLYSIS AT THIS LEVEL MAY AFFECT RESULT   TIBC 302 250 - 450 ug/dL   Saturation Ratios 35 17.9 - 39.5 %   UIBC 197 ug/dL    Comment: Performed at [facility], [address] [place], [place] [address]  CMP (Cancer Center only)     Status: Abnormal   Collection Time: 07/19/23  2:15 PM   Result Value Ref Range   Sodium 135 135 - 145 mmol/L   Potassium 5.3 (H) 3.5 - 5.1 mmol/L   Chloride 102 98 - 111 mmol/L   CO2 22 22 - 32 mmol/L   Glucose, Bld 146 (H) 70 - 99 mg/dL    Comment: Glucose reference range applies only to samples taken after fasting for at least 8 hours.   BUN 57 (H) 6 - 20 mg/dL   Creatinine 5.95 (H) 9.38 - 1.24 mg/dL   Calcium 9.1 8.9 - 89.6 mg/dL   Total Protein 8.7 (H) 6.5 - 8.1 g/dL   Albumin 3.9 3.5 - 5.0 g/dL   AST 36 15 - 41 U/L   ALT 42 0 - 44 U/L   Alkaline Phosphatase 97 38 - 126 U/L   Total Bilirubin 0.4 <1.2 mg/dL   GFR, Estimated 19 (L) >60 mL/min    Comment: (NOTE) Calculated using the CKD-EPI Creatinine Equation (2021)    Anion gap 11 5 - 15    Comment: Performed at [facility], [address] [place], [place] [address]  CBC with Differential (Cancer Center Only)     Status: Abnormal   Collection Time: 07/19/23  2:15 PM  Result Value Ref Range   WBC Count 9.9 4.0 - 10.5 K/uL   RBC 3.64 (L) 4.22 - 5.81 MIL/uL   Hemoglobin 10.7 (L) 13.0 - 17.0 g/dL   HCT 67.1 (L) 60.9 - 47.9 %   MCV 90.1 80.0 - 100.0 fL   MCH 29.4 26.0 - 34.0 pg   MCHC 32.6 30.0 - 36.0 g/dL   RDW 86.9 88.4 - 84.4 %   Platelet Count 264 150 - 400 K/uL   nRBC 0.0 0.0 - 0.2 %   Neutrophils  Relative % 42 %   Neutro Abs 4.2 1.7 - 7.7 K/uL   Lymphocytes Relative 41 %   Lymphs Abs 4.1 (H) 0.7 - 4.0 K/uL   Monocytes Relative 9 %   Monocytes Absolute 0.9 0.1 - 1.0 K/uL   Eosinophils Relative 7 %   Eosinophils Absolute 0.7 (H) 0.0 - 0.5 K/uL   Basophils Relative 1 %   Basophils Absolute 0.1 0.0 - 0.1 K/uL   Immature Granulocytes 0 %   Abs Immature Granulocytes 0.03 0.00 - 0.07 K/uL    Comment: Performed at [facility], [address] [place], [place] [address]  POCT glycosylated hemoglobin (Hb A1C)     Status: Abnormal   Collection Time: 09/29/23 10:14 AM  Result Value Ref Range   Hemoglobin A1C 7.4 (A) 4.0 - 5.6 %   HbA1c POC (<> result, manual  entry)     HbA1c, POC (prediabetic range)     HbA1c, POC (controlled diabetic range)      Radiology VAS US  UPPER EXTREMITY ARTERIAL DUPLEX Result Date: 09/07/2023  UPPER EXTREMITY DUPLEX STUDY Patient Name:  DRACO MALCZEWSKI  Date of Exam:   09/07/2023 Medical Rec #: [id number]     Accession #:    [id number] Date of Birth: 10/10/1990     Patient Gender: M Patient Age:   32 years Exam Location:  [facility] Procedure:      VAS US  UPPER EXTREMITY ARTERIAL DUPLEX Referring Phys: ORVIN DARING --------------------------------------------------------------------------------  Indications: Pre-Assessment for HDA.  Performing Technologist: Leafy Gibes RVS  Examination Guidelines: A complete evaluation includes B-mode imaging, spectral Doppler, color Doppler, and power Doppler as needed of all accessible portions of each vessel. Bilateral testing is considered an integral part of a complete examination. Limited examinations for reoccurring indications may be performed as noted.  Right Pre-Dialysis Findings: +-----------------------+----------+--------------------+---------+--------+ Location               PSV (cm/s)Intralum. Diam. (cm)Waveform Comments +-----------------------+----------+--------------------+---------+--------+ Brachial Antecub. fossa76        0.50                triphasic         +-----------------------+----------+--------------------+---------+--------+ Radial Art at Wrist    55        0.20                triphasic         +-----------------------+----------+--------------------+---------+--------+ Ulnar Art at Wrist     57        0.27                triphasic         +-----------------------+----------+--------------------+---------+--------+  Left Pre-Dialysis Findings: +-----------------------+----------+--------------------+---------+--------+ Location               PSV (cm/s)Intralum. Diam. (cm)Waveform Comments  +-----------------------+----------+--------------------+---------+--------+ Brachial Antecub. fossa93        0.47                triphasic         +-----------------------+----------+--------------------+---------+--------+ Radial Art at Wrist    60        0.26                triphasic         +-----------------------+----------+--------------------+---------+--------+ Ulnar Art at Wrist     68        0.22                triphasic         +-----------------------+----------+--------------------+---------+--------+  Summary:  Right: The Radial Artery, Ulnar Artery and Brachial Artery appear to        display Triphasic Waveforms. Left: The Radial Artery, Ulnar Artery and Brachial Artery appear to       display Triphasic Waveforms. *See table(s) above for measurements and observations. Electronically signed by Selinda Gu MD on 09/07/2023 at 2:14:05 PM.    Final    VAS US  UPPER EXT VEIN MAPPING (PRE-OP  AVF) Result Date: 09/07/2023 UPPER EXTREMITY VEIN MAPPING Patient Name:  ANDREU DRUDGE  Date of Exam:   09/07/2023 Medical Rec #: [id number]     Accession #:    [id number] Date of Birth: [birth year]     Patient Gender: M Patient Age:   1 years Exam Location:  [facility] Procedure:      VAS US  UPPER EXT VEIN MAPPING (PRE-OP  AVF) Referring Phys: ORVIN DARING --------------------------------------------------------------------------------  Indications: Pre-access. Performing Technologist: Leafy Gibes RVS  Examination Guidelines: A complete evaluation includes B-mode imaging, spectral Doppler, color Doppler, and power Doppler as needed of all accessible portions of each vessel. Bilateral testing is considered an integral part of a complete examination. Limited examinations for reoccurring indications may be performed as noted. +-----------------+-------------+----------+--------+ Right Cephalic   Diameter (cm)Depth (cm)Findings +-----------------+-------------+----------+--------+ Shoulder              0.30                        +-----------------+-------------+----------+--------+ Prox upper arm       0.34                        +-----------------+-------------+----------+--------+ Mid upper arm        0.24                        +-----------------+-------------+----------+--------+ Dist upper arm       0.15                        +-----------------+-------------+----------+--------+ Antecubital fossa    0.17                        +-----------------+-------------+----------+--------+ Prox forearm         0.15                        +-----------------+-------------+----------+--------+ Mid forearm          0.13                        +-----------------+-------------+----------+--------+ Dist forearm         0.16                        +-----------------+-------------+----------+--------+ +-----------------+-------------+----------+--------+ Right Basilic    Diameter (cm)Depth (cm)Findings +-----------------+-------------+----------+--------+ Mid upper arm        0.40                        +-----------------+-------------+----------+--------+ Dist upper arm       0.39                        +-----------------+-------------+----------+--------+ Antecubital fossa    0.44                        +-----------------+-------------+----------+--------+  Prox forearm         0.37                        +-----------------+-------------+----------+--------+ +-----------------+-------------+----------+--------+ Left Cephalic    Diameter (cm)Depth (cm)Findings +-----------------+-------------+----------+--------+ Shoulder             0.35                        +-----------------+-------------+----------+--------+ Prox upper arm       0.35                        +-----------------+-------------+----------+--------+ Mid upper arm        0.14                        +-----------------+-------------+----------+--------+ Dist upper arm        0.18                        +-----------------+-------------+----------+--------+ Antecubital fossa    0.22                        +-----------------+-------------+----------+--------+ Prox forearm         0.18                        +-----------------+-------------+----------+--------+ Mid forearm          0.20                        +-----------------+-------------+----------+--------+ Dist forearm         0.20                        +-----------------+-------------+----------+--------+ +-----------------+-------------+----------+--------+ Left Basilic     Diameter (cm)Depth (cm)Findings +-----------------+-------------+----------+--------+ Mid upper arm        0.40                        +-----------------+-------------+----------+--------+ Dist upper arm       0.42                        +-----------------+-------------+----------+--------+ Antecubital fossa    0.46                        +-----------------+-------------+----------+--------+ Prox forearm         0.40                        +-----------------+-------------+----------+--------+ Summary: Right: The Basilic Vein and Cephalic Vein appear to display no        evidence of Clot. Left: The Basilic Vein and Cephalic Vein appear to display no       evidence of Clot. *See table(s) above for measurements and observations.  Diagnosing physician: Selinda Gu MD Electronically signed by Selinda Gu MD on 09/07/2023 at 2:13:58 PM.    Final     Assessment/Plan  CKD (chronic kidney disease), stage IV (HCC) His noninvasive studies showed normal arterial studies on each side.  His cephalic vein is marginal with a decent size basilic vein on each side as well.  He is right-hand dominant.  I would plan a left arm AV fistula.  I would explore his antecubital fossa, and  if the cephalic vein is patent and of large enough size that we can use I will create a fistula with this.  If not, he will need a brachiobasilic AV  fistula which will require a second stage operation.  We explained this to him today.  Risks and benefits of the procedure were discussed with the patient and he is agreeable to proceed.  Essential hypertension, benign blood pressure control important in reducing the progression of atherosclerotic disease. On appropriate oral medications.   Insulin  dependent type 1 diabetes mellitus (HCC) blood glucose control important in reducing the progression of atherosclerotic disease. Also, involved in wound healing. On appropriate medications.   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  Still needs a left BKA at some point.      Selinda Gu, MD  10/04/2023 4:59 PM    This note was created with Dragon medical transcription system.  Any errors from dictation are purely unintentional

## 2023-10-04 NOTE — Assessment & Plan Note (Signed)
With Charcot foot.  Still needs a left BKA at some point.

## 2023-10-04 NOTE — Assessment & Plan Note (Signed)
blood glucose control important in reducing the progression of atherosclerotic disease. Also, involved in wound healing. On appropriate medications.  

## 2023-10-04 NOTE — Assessment & Plan Note (Signed)
 His noninvasive studies showed normal arterial studies on each side.  His cephalic vein is marginal with a decent size basilic vein on each side as well.  He is right-hand dominant.  I would plan a left arm AV fistula.  I would explore his antecubital fossa, and if the cephalic vein is patent and of large enough size that we can use I will create a fistula with this.  If not, he will need a brachiobasilic AV fistula which will require a second stage operation.  We explained this to him today.  Risks and benefits of the procedure were discussed with the patient and he is agreeable to proceed.

## 2023-10-17 ENCOUNTER — Telehealth (INDEPENDENT_AMBULATORY_CARE_PROVIDER_SITE_OTHER): Payer: Self-pay

## 2023-10-17 NOTE — Telephone Encounter (Signed)
 I attempted to contact the patient to give he and his mother the information regarding his left arm AV fistula surgery with Dr. Wyn Quaker on 11/17/23 at the [facility]. Pre-op is on 11/04/23 at 8:00 am at the [facility]. Pre-surgical instructions will be sent to Mychart and mailed.

## 2023-11-03 ENCOUNTER — Other Ambulatory Visit (INDEPENDENT_AMBULATORY_CARE_PROVIDER_SITE_OTHER): Payer: Self-pay | Admitting: Nurse Practitioner

## 2023-11-03 DIAGNOSIS — N184 Chronic kidney disease, stage 4 (severe): Secondary | ICD-10-CM

## 2023-11-04 ENCOUNTER — Other Ambulatory Visit: Payer: Self-pay

## 2023-11-04 ENCOUNTER — Encounter
Admission: RE | Admit: 2023-11-04 | Discharge: 2023-11-04 | Disposition: A | Discharge status: Home / Self Care | Payer: Commercial Managed Care - HMO | Source: Ambulatory Visit | Attending: Vascular Surgery | Admitting: Vascular Surgery

## 2023-11-04 VITALS — BP 118/73 | HR 65 | Resp 14 | Ht 68.0 in | Wt 209.0 lb

## 2023-11-04 DIAGNOSIS — B9562 Methicillin resistant Staphylococcus aureus infection as the cause of diseases classified elsewhere: Secondary | ICD-10-CM | POA: Insufficient documentation

## 2023-11-04 DIAGNOSIS — Z01818 Encounter for other preprocedural examination: Secondary | ICD-10-CM | POA: Diagnosis present

## 2023-11-04 DIAGNOSIS — E10618 Type 1 diabetes mellitus with other diabetic arthropathy: Secondary | ICD-10-CM | POA: Diagnosis not present

## 2023-11-04 DIAGNOSIS — Z8639 Personal history of other endocrine, nutritional and metabolic disease: Secondary | ICD-10-CM | POA: Insufficient documentation

## 2023-11-04 DIAGNOSIS — N184 Chronic kidney disease, stage 4 (severe): Secondary | ICD-10-CM | POA: Insufficient documentation

## 2023-11-04 DIAGNOSIS — R7881 Bacteremia: Secondary | ICD-10-CM | POA: Insufficient documentation

## 2023-11-04 LAB — CBC WITH DIFFERENTIAL/PLATELET
Abs Immature Granulocytes: 0.04 10*3/uL (ref 0.00–0.07)
Basophils Absolute: 0.1 10*3/uL (ref 0.0–0.1)
Basophils Relative: 1 %
Eosinophils Absolute: 0.7 10*3/uL — ABNORMAL HIGH (ref 0.0–0.5)
Eosinophils Relative: 7 %
HCT: 30.6 % — ABNORMAL LOW (ref 39.0–52.0)
Hemoglobin: 10 g/dL — ABNORMAL LOW (ref 13.0–17.0)
Immature Granulocytes: 0 %
Lymphocytes Relative: 31 %
Lymphs Abs: 3 10*3/uL (ref 0.7–4.0)
MCH: 29.4 pg (ref 26.0–34.0)
MCHC: 32.7 g/dL (ref 30.0–36.0)
MCV: 90 fL (ref 80.0–100.0)
Monocytes Absolute: 0.7 10*3/uL (ref 0.1–1.0)
Monocytes Relative: 7 %
Neutro Abs: 5.2 10*3/uL (ref 1.7–7.7)
Neutrophils Relative %: 54 %
Platelets: 273 10*3/uL (ref 150–400)
RBC: 3.4 MIL/uL — ABNORMAL LOW (ref 4.22–5.81)
RDW: 12.3 % (ref 11.5–15.5)
WBC: 9.8 10*3/uL (ref 4.0–10.5)
nRBC: 0 % (ref 0.0–0.2)

## 2023-11-04 LAB — TYPE AND SCREEN
ABO/RH(D): O POS
Antibody Screen: NEGATIVE

## 2023-11-04 LAB — BASIC METABOLIC PANEL
Anion gap: 11 (ref 5–15)
BUN: 57 mg/dL — ABNORMAL HIGH (ref 6–20)
CO2: 23 mmol/L (ref 22–32)
Calcium: 8.9 mg/dL (ref 8.9–10.3)
Chloride: 99 mmol/L (ref 98–111)
Creatinine, Ser: 4.28 mg/dL — ABNORMAL HIGH (ref 0.61–1.24)
GFR, Estimated: 18 mL/min — ABNORMAL LOW (ref >60)
Glucose, Bld: 290 mg/dL — ABNORMAL HIGH (ref 70–99)
Potassium: 5.2 mmol/L — ABNORMAL HIGH (ref 3.5–5.1)
Sodium: 133 mmol/L — ABNORMAL LOW (ref 135–145)

## 2023-11-04 LAB — SURGICAL PCR SCREEN
MRSA, PCR: NEGATIVE
Staphylococcus aureus: NEGATIVE

## 2023-11-04 NOTE — Patient Instructions (Addendum)
 Your procedure is scheduled on: Thursday 11/17/23 To find out your arrival time, please call [phone] between 1PM - 3PM on:  Wednesday 11/16/23  Report to the Registration Desk on the 1st floor of the [facility]. FREE Valet parking is available.  If your arrival time is 6:00 am, do not arrive before that time as the [facility] entrance doors do not open until 6:00 am.  REMEMBER: Instructions that are not followed completely may result in serious medical risk, up to and including death; or upon the discretion of your surgeon and anesthesiologist your surgery may need to be rescheduled.  Do not eat food or drink any liquiuds after midnight the night before surgery.  No gum chewing or hard candies  One week prior to surgery: Stop Anti-inflammatories (NSAIDS) such as Advil, Aleve, Ibuprofen, Motrin, Naproxen, Naprosyn and Aspirin based products such as Excedrin, Goody's Powder, BC Powder. You may however, continue to take Tylenol if needed for pain up until the day of surgery.  Continue taking all prescribed medications except: If your take your Humulin N closer to bedtime decrease dose by half.  TAKE ONLY THESE MEDICATIONS THE MORNING OF SURGERY WITH A SIP OF WATER:  carvedilol (COREG) 6.25 MG tablet  cloNIDine (CATAPRES) 0.1 MG tablet  pantoprazole (PROTONIX) 40 MG tablet  Methadone  No Alcohol for 24 hours before or after surgery.  No Smoking including e-cigarettes for 24 hours before surgery.  No chewable tobacco products for at least 6 hours before surgery.  No nicotine patches on the day of surgery.  Do not use any "recreational" drugs for at least a week (preferably 2 weeks) before your surgery.  Please be advised that the combination of cocaine and anesthesia may have negative outcomes, up to and including death. If you test positive for cocaine, your surgery will be cancelled.  On the morning of surgery brush your teeth with toothpaste and water, you may rinse your  mouth with mouthwash if you wish. Do not swallow any toothpaste or mouthwash.  Use CHG Soap or wipes as directed on instruction sheet.  Do not wear lotions, powders, or perfumes. No Deodorant on day of surgery  Do not shave body hair from the neck down 48 hours before surgery.  Wear comfortable clothing (specific to your surgery type) to the hospital.  Do not wear jewelry, make-up, hairpins, clips or nail polish.  For welded (permanent) jewelry: bracelets, anklets, waist bands, etc.  Please have this removed prior to surgery.  If it is not removed, there is a chance that hospital personnel will need to cut it off on the day of surgery. Contact lenses, hearing aids and dentures may not be worn into surgery.  Do not bring valuables to the hospital. [facility] is not responsible for any missing/lost belongings or valuables.   Notify your doctor if there is any change in your medical condition (cold, fever, infection).  If you are being discharged the day of surgery, you will not be allowed to drive home. You will need a responsible individual to drive you home and stay with you for 24 hours after surgery.   If you are taking public transportation, you will need to have a responsible individual with you.  If you are being admitted to the hospital overnight, leave your suitcase in the car. After surgery it may be brought to your room.  In case of increased patient census, it may be necessary for you, the patient, to continue your postoperative care in  the Same Day Surgery department.  After surgery, you can help prevent lung complications by doing breathing exercises.  Take deep breaths and cough every 1-2 hours. Your doctor may order a device called an Incentive Spirometer to help you take deep breaths. When coughing or sneezing, hold a pillow firmly against your incision with both hands. This is called "splinting." Doing this helps protect your incision. It also decreases belly  discomfort.  Surgery Visitation Policy:  Patients undergoing a surgery or procedure may have two family members or support persons with them as long as the person is not COVID-19 positive or experiencing its symptoms.   Inpatient Visitation:    Visiting hours are 7 a.m. to 8 p.m. Up to four visitors are allowed at one time in a patient room. The visitors may rotate out with other people during the day. One designated support person (adult) may remain overnight.  Due to an increase in RSV and influenza rates and associated hospitalizations, children ages 82 and under will not be able to visit patients in [facility]. Masks continue to be strongly recommended.  Please call the Pre-admissions Testing Dept. at [phone] if you have any questions about these instructions.  Preparing the Skin Before Surgery  Sunday 11/13/23 - Thursday 11/17/23    To help prevent the risk of infection at your surgical site, we are now providing you with rinse-free Sage 2% Chlorhexidine Gluconate (CHG) disposable wipes.  Chlorhexidine Gluconate (CHG) Soap  o An antiseptic cleaner that kills germs and bonds with the skin to continue killing germs even after washing  o Used for showering the night before surgery and morning of surgery  The night before surgery: Shower or bathe with warm water. Do not apply perfume, lotions, powders. Wait one hour after shower. Skin should be dry and cool. Open Sage wipe package - use 6 disposable cloths. Wipe body using one cloth for the right arm, one cloth for the left arm, one cloth for the right leg, one cloth for the left leg, one cloth for the chest/abdomen area, and one cloth for the back. Do not use on open wounds or sores. Do not use on face or genitals (private parts). If you are breast feeding, do not use on breasts. 5. Do not rinse, allow to dry. 6. Skin may feel "tacky" for several minutes. 7. Dress in clean clothes. 8. Place clean sheets on  your bed and do not sleep with pets.  REPEAT ABOVE ON THE MORNING OF SURGERY BEFORE ARRIVING TO THE HOSPITAL.

## 2023-11-05 ENCOUNTER — Other Ambulatory Visit: Payer: Self-pay | Admitting: Nurse Practitioner

## 2023-11-05 DIAGNOSIS — R1114 Bilious vomiting: Secondary | ICD-10-CM

## 2023-11-16 ENCOUNTER — Telehealth: Payer: Self-pay

## 2023-11-16 ENCOUNTER — Inpatient Hospital Stay (HOSPITAL_BASED_OUTPATIENT_CLINIC_OR_DEPARTMENT_OTHER): Payer: Managed Care, Other (non HMO) | Admitting: Internal Medicine

## 2023-11-16 ENCOUNTER — Inpatient Hospital Stay: Payer: Managed Care, Other (non HMO)

## 2023-11-16 ENCOUNTER — Inpatient Hospital Stay: Payer: Managed Care, Other (non HMO) | Attending: Internal Medicine

## 2023-11-16 ENCOUNTER — Encounter: Payer: Self-pay | Admitting: Internal Medicine

## 2023-11-16 VITALS — BP 147/90 | HR 73 | Temp 98.1°F | Resp 17

## 2023-11-16 DIAGNOSIS — E1022 Type 1 diabetes mellitus with diabetic chronic kidney disease: Secondary | ICD-10-CM | POA: Insufficient documentation

## 2023-11-16 DIAGNOSIS — N184 Chronic kidney disease, stage 4 (severe): Secondary | ICD-10-CM | POA: Diagnosis not present

## 2023-11-16 DIAGNOSIS — D631 Anemia in chronic kidney disease: Secondary | ICD-10-CM | POA: Insufficient documentation

## 2023-11-16 DIAGNOSIS — F17210 Nicotine dependence, cigarettes, uncomplicated: Secondary | ICD-10-CM | POA: Diagnosis not present

## 2023-11-16 DIAGNOSIS — Z79899 Other long term (current) drug therapy: Secondary | ICD-10-CM | POA: Insufficient documentation

## 2023-11-16 DIAGNOSIS — I120 Hypertensive chronic kidney disease with stage 5 chronic kidney disease or end stage renal disease: Secondary | ICD-10-CM | POA: Insufficient documentation

## 2023-11-16 DIAGNOSIS — N186 End stage renal disease: Secondary | ICD-10-CM | POA: Insufficient documentation

## 2023-11-16 DIAGNOSIS — Z794 Long term (current) use of insulin: Secondary | ICD-10-CM | POA: Diagnosis not present

## 2023-11-16 LAB — CMP (CANCER CENTER ONLY)
ALT: 40 U/L (ref 0–44)
AST: 33 U/L (ref 15–41)
Albumin: 3.9 g/dL (ref 3.5–5.0)
Alkaline Phosphatase: 97 U/L (ref 38–126)
Anion gap: 9 (ref 5–15)
BUN: 52 mg/dL — ABNORMAL HIGH (ref 6–20)
CO2: 23 mmol/L (ref 22–32)
Calcium: 9 mg/dL (ref 8.9–10.3)
Chloride: 101 mmol/L (ref 98–111)
Creatinine: 4.38 mg/dL — ABNORMAL HIGH (ref 0.61–1.24)
GFR, Estimated: 17 mL/min — ABNORMAL LOW (ref >60)
Glucose, Bld: 146 mg/dL — ABNORMAL HIGH (ref 70–99)
Potassium: 4.7 mmol/L (ref 3.5–5.1)
Sodium: 133 mmol/L — ABNORMAL LOW (ref 135–145)
Total Bilirubin: 0.5 mg/dL (ref 0.0–1.2)
Total Protein: 8.8 g/dL — ABNORMAL HIGH (ref 6.5–8.1)

## 2023-11-16 LAB — CBC WITH DIFFERENTIAL (CANCER CENTER ONLY)
Abs Immature Granulocytes: 0.04 10*3/uL (ref 0.00–0.07)
Basophils Absolute: 0.1 10*3/uL (ref 0.0–0.1)
Basophils Relative: 1 %
Eosinophils Absolute: 0.6 10*3/uL — ABNORMAL HIGH (ref 0.0–0.5)
Eosinophils Relative: 7 %
HCT: 34 % — ABNORMAL LOW (ref 39.0–52.0)
Hemoglobin: 10.9 g/dL — ABNORMAL LOW (ref 13.0–17.0)
Immature Granulocytes: 0 %
Lymphocytes Relative: 38 %
Lymphs Abs: 3.7 10*3/uL (ref 0.7–4.0)
MCH: 29 pg (ref 26.0–34.0)
MCHC: 32.1 g/dL (ref 30.0–36.0)
MCV: 90.4 fL (ref 80.0–100.0)
Monocytes Absolute: 0.8 10*3/uL (ref 0.1–1.0)
Monocytes Relative: 8 %
Neutro Abs: 4.4 10*3/uL (ref 1.7–7.7)
Neutrophils Relative %: 46 %
Platelet Count: 269 10*3/uL (ref 150–400)
RBC: 3.76 MIL/uL — ABNORMAL LOW (ref 4.22–5.81)
RDW: 12.5 % (ref 11.5–15.5)
WBC Count: 9.6 10*3/uL (ref 4.0–10.5)
nRBC: 0 % (ref 0.0–0.2)

## 2023-11-16 LAB — IRON AND TIBC
Iron: 89 ug/dL (ref 45–182)
Saturation Ratios: 30 % (ref 17.9–39.5)
TIBC: 300 ug/dL (ref 250–450)
UIBC: 211 ug/dL

## 2023-11-16 LAB — FERRITIN: Ferritin: 118 ng/mL (ref 24–336)

## 2023-11-16 NOTE — Progress Notes (Signed)
 Pt states his pain is tolerable. Rated 2-3/10 on pain scale. Plans on amputation surgery in July. Pt denies lightheadedness. He is mostly fatigued and inactivity is hard for him. No blood in sttol. Appetite is good..Having dialysis fistula placed tomorrow.

## 2023-11-16 NOTE — Assessment & Plan Note (Addendum)
#   Chronic anemia-normocytic recently getting worse.  Patient is mildy symptomatic from anemia.  Etiology likely from chronic kidney disease.  May 2024-  iron saturation 12 ; ferritin 80 ;   # Today- hemoglobin- 10.9.  stable; continue  gentle iron [iron biglycinate; 28 mg ] 2 pill a day.  # HOLD Venofer [Poor IV access/reluctant];   Retacrit approved by insurance only at home; pt reluctant with home shots.   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  # PVD/ Left foot s/p Dr.Dew evaluation- awaiting medical optimization holding off surgery- stable.  # Hyperkalemia- chronic 4.3  reocmmend use lokelma [Dr.lateef]- stable.  # DISPOSITION: # HOLD venofer # follow up 4  months- MD;labs- cbc/cmp; iron studies; ferriitn; - possible venofer- Dr.B

## 2023-11-16 NOTE — Telephone Encounter (Signed)
 Pt walk in today that she need letter from alyssa that he is unable to work advised him that as per alyssa it will take 2 weeks as per pt he need by end of week he will talk to alyssa at Thursday

## 2023-11-16 NOTE — Progress Notes (Signed)
  [facility] Center CONSULT NOTE  Patient Care Team: Sallyanne Kuster, NP as PCP - General (Nurse Practitioner) Earna Coder, MD as Consulting Physician (Oncology)  CHIEF COMPLAINTS/PURPOSE OF CONSULTATION: ANEMIA   HEMATOLOGY HISTORY  # ANEMIA[Hb; MCV-platelets- WBC; Iron sat; ferritin;  GFR- CT/US; EGD/colonoscopy-  # CKD-IV [Dr.Lateef]  HISTORY OF PRESENTING ILLNESS: Patient ambulating-with assistance/crutches.  Accompanied by girl friend.   Jason Francis 33 y.o.  male pleasant patient with multiple medical problems including- hx of cocaine and marijuana abuse, chronic kidney disease stage IV/insulin-dependent diabetes; history of hep C/-today for follow-up.  Pt states his pain is tolerable. Rated 2-3/10 on pain scale. Plans on amputation surgery in July. Pt denies lightheadedness.   He is mostly fatigued and inactivity is hard for him. No blood in stool. Appetite is good.  Having dialysis fistula placed tomorrow.  C/o needing lt foot amputated- not yet.    Review of Systems  Constitutional:  Positive for malaise/fatigue. Negative for chills, diaphoresis, fever and weight loss.  HENT:  Negative for nosebleeds and sore throat.   Eyes:  Negative for double vision.  Respiratory:  Negative for cough, hemoptysis, sputum production, shortness of breath and wheezing.   Cardiovascular:  Negative for chest pain, palpitations, orthopnea and leg swelling.  Gastrointestinal:  Negative for abdominal pain, blood in stool, constipation, diarrhea, heartburn, melena, nausea and vomiting.  Genitourinary:  Negative for dysuria, frequency and urgency.  Musculoskeletal:  Positive for back pain and joint pain.  Skin: Negative.  Negative for itching and rash.  Neurological:  Negative for dizziness, tingling, focal weakness, weakness and headaches.  Endo/Heme/Allergies:  Does not bruise/bleed easily.  Psychiatric/Behavioral:  Negative for depression. The patient is not nervous/anxious  and does not have insomnia.      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)     SURGICAL HISTORY: Past Surgical History:  Procedure Laterality Date   APPENDECTOMY     FOOT SURGERY Left 2019   MYRINGOTOMY      SOCIAL HISTORY: Social History   Socioeconomic History   Marital status: Media planner    Spouse name: Not on file   Number of children: Not on file   Years of education: Not on file   Highest education level: Not on file  Occupational History   Not on file  Tobacco Use   Smoking status: Every Day    Current packs/day: 1.00    Average packs/day: 1 pack/day for 21.2 years (21.2 ttl pk-yrs)    Types: Cigarettes    Start date: 08/30/2002   Smokeless tobacco: Never   Tobacco comments:    1/2 pack daily.  Vaping Use   Vaping status: Former  Substance and Sexual Activity   Alcohol use: Not Currently   Drug use: Yes    Types: Marijuana, Cocaine, Heroin    Comment: last stated cocaine use ; marijuana daily   Sexual activity: Yes    Partners:  Female  Other Topics Concern   Not on file  Social History Narrative   Not on file   Social Drivers of Health   Financial Resource Strain: Low Risk  (03/01/2018)   Received from [facility], [facility]   Overall Financial Resource Strain (CARDIA)    Difficulty of Paying Living Expenses: Not hard at all  Food Insecurity: No Food Insecurity (01/21/2023)   Hunger Vital Sign    Worried About Running Out of Food in the Last  Year: Never true    Ran Out of Food in the Last Year: Never true  Transportation Needs: No Transportation Needs (01/21/2023)   PRAPARE - Administrator, Civil Service (Medical): No    Lack of Transportation (Non-Medical): No  Physical Activity: Inactive (03/01/2018)   Received from [facility], [facility]   Exercise Vital Sign    Days of Exercise per Week: 0 days    Minutes of Exercise per Session: 0 min  Stress: Stress Concern Present (03/01/2018)   Received from [facility], [facility]   [employer] of Occupational Health - Occupational Stress Questionnaire    Feeling of Stress : Rather much  Social Connections: Unknown (03/01/2018)   Received from [facility], [facility]   Social Connection and Isolation Panel [NHANES]    Frequency of Communication with Friends and Family: Three times a week    Frequency of Social Gatherings with Friends and Family: Three times a week    Attends Religious Services: Never    Active Member of Clubs or Organizations: No    Attends Banker Meetings: Never    Marital Status: Not on file  Intimate Partner Violence: Not At Risk (01/21/2023)   Humiliation, Afraid, Rape, and Kick questionnaire    Fear of Current or Ex-Partner: No    Emotionally Abused: No    Physically Abused: No    Sexually Abused: No    FAMILY HISTORY: Family History  Problem Relation Age of Onset   Heart disease Mother    Hypertension Mother    Obesity Mother    Gout Mother    Thyroid disease Mother    High blood pressure Maternal Grandmother    Dementia Maternal Grandmother    Heart disease Maternal Grandfather     ALLERGIES:  is allergic to augmentin [amoxicillin-pot clavulanate] and sulfa antibiotics.  MEDICATIONS:  Current Outpatient Medications  Medication Sig Dispense Refill   amLODipine (NORVASC) 10 MG tablet Take 10 mg by mouth  every evening.     calcitRIOL (ROCALTROL) 0.25 MCG capsule Take 0.25 mcg by mouth daily.     carvedilol (COREG) 6.25 MG tablet Take 6.25 mg by mouth 2 (two) times daily with a meal.     cloNIDine (CATAPRES) 0.1 MG tablet Take 0.1 mg by mouth 2 (two) times daily.     cyclobenzaprine (FLEXERIL) 10 MG tablet Take 1 tablet (10 mg total) by mouth 2 (two) times daily as needed for muscle spasms. 60 tablet 3   Ferrous Sulfate (IRON) 28 MG TABS Take 2 tablets by mouth daily.     insulin lispro (HUMALOG) 100 UNIT/ML injection Use insulin with omnipod, 200 units maximum every 72 hours 10 mL 11   insulin NPH Human (NOVOLIN N) 100 UNIT/ML injection Inject 0.2 mLs (20 Units total) into the skin 2 (two) times daily before a meal. (Patient taking differently: Inject 15 Units into the  skin 2 (two) times daily before a meal.) 10 mL 11   Insulin Pen Needle (PEN NEEDLES 31GX5/16") 31G X 8 MM MISC 1 each by Does not apply route as directed. 100 each 1   insulin regular (HUMULIN R) 100 units/mL injection INJECT INTO SKIN BEFORE MEALS ACCORDING TO SLIDING SCALE - MAX DOSE 48 UNITS IN 24 HOURS 20 mL 0   methadone (DOLOPHINE) 1 MG/1ML solution Take 95 mg by mouth daily. Methadone clinic     metoCLOPramide (REGLAN) 10 MG tablet TAKE 1 TABLET BY MOUTH TWICE DAILY AS NEEDED FOR NAUSEA OR VOMITING 180 tablet 0   pantoprazole (PROTONIX) 40 MG tablet Take 1 tablet (40 mg total) by mouth daily. 90 tablet 1   sodium bicarbonate 650 MG tablet Take 650 mg by mouth 2 (two) times daily.     sodium zirconium cyclosilicate (LOKELMA) 10 g PACK packet Take 10 g by mouth every other day.     Continuous Glucose Sensor (DEXCOM G7 SENSOR) MISC Use one every 10 days for uncontrolled dm (Patient not taking: Reported on 11/04/2023) 3 each 11   No current facility-administered medications for this visit.     PHYSICAL EXAMINATION:   Vitals:   11/16/23 1430  BP: (!) 147/90  Pulse: 73  Resp: 17  Temp: 98.1 F (36.7 C)  SpO2: 100%     There were no vitals filed for this visit.   Physical Exam Vitals and nursing note reviewed.  HENT:     Head: Normocephalic and atraumatic.     Mouth/Throat:     Pharynx: Oropharynx is clear.  Eyes:     Extraocular Movements: Extraocular movements intact.     Pupils: Pupils are equal, round, and reactive to light.  Cardiovascular:     Rate and Rhythm: Normal rate and regular rhythm.  Pulmonary:     Comments: Decreased breath sounds bilaterally.  Abdominal:     Palpations: Abdomen is soft.  Musculoskeletal:        General: Normal range of motion.     Cervical back: Normal range of motion.  Skin:    General: Skin is warm.  Neurological:     General: No focal deficit present.     Mental Status: He is alert and oriented to person, place, and time.  Psychiatric:        Behavior: Behavior normal.        Judgment: Judgment normal.      LABORATORY DATA:  I have reviewed the data as listed Lab Results  Component Value Date   WBC 9.6 11/16/2023   HGB 10.9 (L) 11/16/2023   HCT 34.0 (L) 11/16/2023   MCV 90.4 11/16/2023   PLT 269 11/16/2023   Recent Labs    04/18/23 1428 05/08/23 1512 07/19/23 1415 11/04/23 0922 11/16/23 1409  NA 131*   < > 135 133* 133*  K 5.4*   < > 5.3* 5.2* 4.7  CL 104   < > 102 99 101  CO2 19*   < > 22 23 23   GLUCOSE 68*   < > 146* 290* 146*  BUN 75*   < > 57* 57* 52*  CREATININE 4.53*   < > 4.04* 4.28* 4.38*  CALCIUM 8.9   < > 9.1 8.9 9.0  GFRNONAA 17*   < > 19* 18* 17*  PROT 9.1*  --  8.7*  --  8.8*  ALBUMIN 3.9  --  3.9  --  3.9  AST 30  --  36  --  33  ALT 35  --  42  --  40  ALKPHOS 80  --  97  --  97  BILITOT 0.4  --  0.4  --  0.5   < > = values in this interval not displayed.     No results found.  ASSESSMENT & PLAN:   Anemia of chronic kidney failure, stage 4 (severe) (HCC) # Chronic anemia-normocytic recently getting worse.  Patient is mildy symptomatic from anemia.  Etiology likely from chronic kidney disease.  May  2024-  iron saturation 12 ; ferritin 80 ;   # Today- hemoglobin- 10.9.  stable; continue  gentle iron [iron biglycinate; 28 mg ] 2 pill a day.  # HOLD Venofer [Poor IV access/reluctant];   Retacrit approved by insurance only at home; pt reluctant with home shots.   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  # PVD/ Left foot s/p Dr.Dew evaluation- awaiting medical optimization holding off surgery- stable.  # Hyperkalemia- chronic 4.3  reocmmend use lokelma [Dr.lateef]- stable.  # DISPOSITION: # HOLD venofer # follow up 4  months- MD;labs- cbc/cmp; iron studies; ferriitn; - possible venofer- Dr.B   All questions were answered. The patient knows to call the clinic with any problems, questions or concerns.   Earna Coder, MD 11/16/2023 3:13 PM

## 2023-11-17 ENCOUNTER — Encounter: Payer: Self-pay | Admitting: Internal Medicine

## 2023-11-17 ENCOUNTER — Other Ambulatory Visit: Payer: Self-pay

## 2023-11-17 ENCOUNTER — Ambulatory Visit
Admission: RE | Admit: 2023-11-17 | Discharge: 2023-11-17 | Disposition: A | Discharge status: Home / Self Care | Payer: Commercial Managed Care - HMO | Source: Ambulatory Visit | Attending: Vascular Surgery | Admitting: Vascular Surgery

## 2023-11-17 ENCOUNTER — Ambulatory Visit: Payer: Self-pay | Admitting: Urgent Care

## 2023-11-17 ENCOUNTER — Encounter: Payer: Self-pay | Admitting: Vascular Surgery

## 2023-11-17 ENCOUNTER — Encounter
Admission: RE | Disposition: A | Discharge status: Home / Self Care | Payer: Self-pay | Source: Ambulatory Visit | Attending: Vascular Surgery

## 2023-11-17 ENCOUNTER — Ambulatory Visit: Payer: Self-pay | Admitting: Anesthesiology

## 2023-11-17 DIAGNOSIS — Z8639 Personal history of other endocrine, nutritional and metabolic disease: Secondary | ICD-10-CM

## 2023-11-17 DIAGNOSIS — N184 Chronic kidney disease, stage 4 (severe): Secondary | ICD-10-CM | POA: Diagnosis not present

## 2023-11-17 DIAGNOSIS — I129 Hypertensive chronic kidney disease with stage 1 through stage 4 chronic kidney disease, or unspecified chronic kidney disease: Secondary | ICD-10-CM | POA: Insufficient documentation

## 2023-11-17 DIAGNOSIS — I878 Other specified disorders of veins: Secondary | ICD-10-CM

## 2023-11-17 DIAGNOSIS — F17210 Nicotine dependence, cigarettes, uncomplicated: Secondary | ICD-10-CM | POA: Diagnosis not present

## 2023-11-17 DIAGNOSIS — E1022 Type 1 diabetes mellitus with diabetic chronic kidney disease: Secondary | ICD-10-CM | POA: Insufficient documentation

## 2023-11-17 DIAGNOSIS — K219 Gastro-esophageal reflux disease without esophagitis: Secondary | ICD-10-CM | POA: Insufficient documentation

## 2023-11-17 DIAGNOSIS — Z794 Long term (current) use of insulin: Secondary | ICD-10-CM | POA: Diagnosis not present

## 2023-11-17 DIAGNOSIS — M86372 Chronic multifocal osteomyelitis, left ankle and foot: Secondary | ICD-10-CM | POA: Diagnosis not present

## 2023-11-17 DIAGNOSIS — Z87898 Personal history of other specified conditions: Secondary | ICD-10-CM

## 2023-11-17 DIAGNOSIS — Z8619 Personal history of other infectious and parasitic diseases: Secondary | ICD-10-CM | POA: Insufficient documentation

## 2023-11-17 DIAGNOSIS — E10610 Type 1 diabetes mellitus with diabetic neuropathic arthropathy: Secondary | ICD-10-CM | POA: Insufficient documentation

## 2023-11-17 DIAGNOSIS — E1069 Type 1 diabetes mellitus with other specified complication: Secondary | ICD-10-CM | POA: Insufficient documentation

## 2023-11-17 DIAGNOSIS — E10618 Type 1 diabetes mellitus with other diabetic arthropathy: Secondary | ICD-10-CM

## 2023-11-17 HISTORY — PX: AV FISTULA PLACEMENT: SHX1204

## 2023-11-17 LAB — POCT I-STAT, CHEM 8
BUN: 49 mg/dL — ABNORMAL HIGH (ref 6–20)
Calcium, Ion: 1.06 mmol/L — ABNORMAL LOW (ref 1.15–1.40)
Chloride: 103 mmol/L (ref 98–111)
Creatinine, Ser: 4.6 mg/dL — ABNORMAL HIGH (ref 0.61–1.24)
Glucose, Bld: 200 mg/dL — ABNORMAL HIGH (ref 70–99)
HCT: 35 % — ABNORMAL LOW (ref 39.0–52.0)
Hemoglobin: 11.9 g/dL — ABNORMAL LOW (ref 13.0–17.0)
Potassium: 4.7 mmol/L (ref 3.5–5.1)
Sodium: 134 mmol/L — ABNORMAL LOW (ref 135–145)
TCO2: 21 mmol/L — ABNORMAL LOW (ref 22–32)

## 2023-11-17 LAB — GLUCOSE, CAPILLARY: Glucose-Capillary: 294 mg/dL — ABNORMAL HIGH (ref 70–99)

## 2023-11-17 SURGERY — ARTERIOVENOUS (AV) FISTULA CREATION
Anesthesia: General | Site: Arm Upper | Laterality: Left

## 2023-11-17 MED ORDER — ORAL CARE MOUTH RINSE: 15.0000 mL | Freq: Once | OROMUCOSAL | Status: AC

## 2023-11-17 MED ORDER — CHLORHEXIDINE GLUCONATE 0.12 % MT SOLN
OROMUCOSAL | Status: AC
  Filled 2023-11-17: qty 15

## 2023-11-17 MED ORDER — SODIUM CHLORIDE 0.9 % IV SOLN
INTRAVENOUS | Status: DC | PRN
  Administered 2023-11-17: 500 mL

## 2023-11-17 MED ORDER — FENTANYL CITRATE (PF) 100 MCG/2ML IJ SOLN
INTRAMUSCULAR | Status: AC
  Filled 2023-11-17: qty 2

## 2023-11-17 MED ORDER — DROPERIDOL 2.5 MG/ML IJ SOLN: 0.6250 mg | Freq: Once | INTRAMUSCULAR | Status: DC | PRN

## 2023-11-17 MED ORDER — OXYCODONE-ACETAMINOPHEN 7.5-325 MG PO TABS: 1.0000 | ORAL_TABLET | ORAL | 0 refills | Status: DC | PRN

## 2023-11-17 MED ORDER — HEPARIN SODIUM (PORCINE) 1000 UNIT/ML IJ SOLN
INTRAMUSCULAR | Status: DC | PRN
  Administered 2023-11-17: 3000 [IU] via INTRAVENOUS

## 2023-11-17 MED ORDER — CHLORHEXIDINE GLUCONATE CLOTH 2 % EX PADS
6.0000 | MEDICATED_PAD | Freq: Once | CUTANEOUS | Status: AC
Start: 2023-11-17 — End: 2023-11-17
  Administered 2023-11-17: 6 via TOPICAL

## 2023-11-17 MED ORDER — PHENYLEPHRINE HCL-NACL 20-0.9 MG/250ML-% IV SOLN
INTRAVENOUS | Status: AC
  Filled 2023-11-17: qty 250

## 2023-11-17 MED ORDER — HYDROMORPHONE HCL 1 MG/ML IJ SOLN
1.0000 mg | Freq: Once | INTRAMUSCULAR | Status: AC | PRN
  Administered 2023-11-17: 1 mg via INTRAVENOUS

## 2023-11-17 MED ORDER — HYDROMORPHONE HCL 1 MG/ML IJ SOLN
INTRAMUSCULAR | Status: AC
  Filled 2023-11-17: qty 1

## 2023-11-17 MED ORDER — MIDAZOLAM HCL 2 MG/2ML IJ SOLN
INTRAMUSCULAR | Status: AC
  Filled 2023-11-17: qty 2

## 2023-11-17 MED ORDER — OXYCODONE HCL 5 MG PO TABS
ORAL_TABLET | ORAL | Status: AC
  Filled 2023-11-17: qty 1

## 2023-11-17 MED ORDER — DEXMEDETOMIDINE HCL IN NACL 80 MCG/20ML IV SOLN
INTRAVENOUS | Status: DC | PRN
Start: 2023-11-17 — End: 2023-11-17
  Administered 2023-11-17: 8 ug via INTRAVENOUS

## 2023-11-17 MED ORDER — LIDOCAINE HCL (PF) 2 % IJ SOLN
INTRAMUSCULAR | Status: AC
  Filled 2023-11-17: qty 5

## 2023-11-17 MED ORDER — FENTANYL CITRATE (PF) 100 MCG/2ML IJ SOLN
50.0000 ug | Freq: Once | INTRAMUSCULAR | Status: AC
  Administered 2023-11-17: 50 ug via INTRAVENOUS

## 2023-11-17 MED ORDER — GLYCOPYRROLATE 0.2 MG/ML IJ SOLN
INTRAMUSCULAR | Status: DC | PRN
  Administered 2023-11-17: .2 mg via INTRAVENOUS

## 2023-11-17 MED ORDER — FENTANYL CITRATE (PF) 100 MCG/2ML IJ SOLN
INTRAMUSCULAR | Status: DC | PRN
  Administered 2023-11-17 (×2): 25 ug via INTRAVENOUS
  Administered 2023-11-17: 50 ug via INTRAVENOUS

## 2023-11-17 MED ORDER — BUPIVACAINE-EPINEPHRINE (PF) 0.5% -1:200000 IJ SOLN
INTRAMUSCULAR | Status: AC
  Filled 2023-11-17: qty 30

## 2023-11-17 MED ORDER — ACETAMINOPHEN 10 MG/ML IV SOLN
INTRAVENOUS | Status: DC | PRN
  Administered 2023-11-17: 1000 mg via INTRAVENOUS

## 2023-11-17 MED ORDER — FENTANYL CITRATE (PF) 100 MCG/2ML IJ SOLN
25.0000 ug | INTRAMUSCULAR | Status: DC | PRN
  Administered 2023-11-17: 25 ug via INTRAVENOUS
  Administered 2023-11-17: 50 ug via INTRAVENOUS
  Administered 2023-11-17 (×3): 25 ug via INTRAVENOUS

## 2023-11-17 MED ORDER — FIBRIN SEALANT 2 ML SINGLE DOSE KIT
PACK | CUTANEOUS | Status: DC | PRN
Start: 2023-11-17 — End: 2023-11-17
  Administered 2023-11-17: 2 mL via TOPICAL

## 2023-11-17 MED ORDER — HYDROMORPHONE HCL 1 MG/ML IJ SOLN
INTRAMUSCULAR | Status: DC | PRN
  Administered 2023-11-17: .5 mg via INTRAVENOUS

## 2023-11-17 MED ORDER — ACETAMINOPHEN 10 MG/ML IV SOLN
INTRAVENOUS | Status: DC | PRN
Start: 2023-11-17 — End: 2023-11-17

## 2023-11-17 MED ORDER — ACETAMINOPHEN 10 MG/ML IV SOLN
INTRAVENOUS | Status: AC
  Filled 2023-11-17: qty 100

## 2023-11-17 MED ORDER — ONDANSETRON HCL 4 MG/2ML IJ SOLN
INTRAMUSCULAR | Status: DC | PRN
  Administered 2023-11-17: 4 mg via INTRAVENOUS

## 2023-11-17 MED ORDER — PROPOFOL 10 MG/ML IV BOLUS
INTRAVENOUS | Status: AC
  Filled 2023-11-17: qty 40

## 2023-11-17 MED ORDER — HEPARIN SODIUM (PORCINE) 5000 UNIT/ML IJ SOLN
INTRAMUSCULAR | Status: AC
  Filled 2023-11-17: qty 1

## 2023-11-17 MED ORDER — CHLORHEXIDINE GLUCONATE CLOTH 2 % EX PADS
6.0000 | MEDICATED_PAD | Freq: Once | CUTANEOUS | Status: AC
  Administered 2023-11-17: 6 via TOPICAL

## 2023-11-17 MED ORDER — OXYCODONE HCL 5 MG PO TABS
5.0000 mg | ORAL_TABLET | Freq: Once | ORAL | Status: AC
  Administered 2023-11-17: 5 mg via ORAL

## 2023-11-17 MED ORDER — PROPOFOL 10 MG/ML IV BOLUS
INTRAVENOUS | Status: DC | PRN
  Administered 2023-11-17: 200 mg via INTRAVENOUS

## 2023-11-17 MED ORDER — CHLORHEXIDINE GLUCONATE 0.12 % MT SOLN
15.0000 mL | Freq: Once | OROMUCOSAL | Status: AC
  Administered 2023-11-17: 15 mL via OROMUCOSAL

## 2023-11-17 MED ORDER — ONDANSETRON HCL 4 MG/2ML IJ SOLN: 4.0000 mg | Freq: Four times a day (QID) | INTRAMUSCULAR | Status: DC | PRN

## 2023-11-17 MED ORDER — SODIUM CHLORIDE 0.9 % IV SOLN: INTRAVENOUS | Status: DC

## 2023-11-17 MED ORDER — LIDOCAINE HCL (CARDIAC) PF 100 MG/5ML IV SOSY
PREFILLED_SYRINGE | INTRAVENOUS | Status: DC | PRN
Start: 2023-11-17 — End: 2023-11-17
  Administered 2023-11-17: 100 mg via INTRAVENOUS

## 2023-11-17 MED ORDER — CEFAZOLIN SODIUM-DEXTROSE 2-4 GM/100ML-% IV SOLN
2.0000 g | INTRAVENOUS | Status: AC
  Administered 2023-11-17: 2 g via INTRAVENOUS

## 2023-11-17 MED ORDER — MIDAZOLAM HCL 2 MG/2ML IJ SOLN
INTRAMUSCULAR | Status: DC | PRN
  Administered 2023-11-17: 2 mg via INTRAVENOUS

## 2023-11-17 MED ORDER — HEPARIN SODIUM (PORCINE) 1000 UNIT/ML IJ SOLN
INTRAMUSCULAR | Status: AC
  Filled 2023-11-17: qty 10

## 2023-11-17 MED ORDER — CEFAZOLIN SODIUM-DEXTROSE 2-4 GM/100ML-% IV SOLN
INTRAVENOUS | Status: AC
  Filled 2023-11-17: qty 100

## 2023-11-17 MED ORDER — GLYCOPYRROLATE 0.2 MG/ML IJ SOLN
INTRAMUSCULAR | Status: AC
  Filled 2023-11-17: qty 1

## 2023-11-17 MED ORDER — PHENYLEPHRINE HCL-NACL 20-0.9 MG/250ML-% IV SOLN
INTRAVENOUS | Status: DC | PRN
Start: 2023-11-17 — End: 2023-11-17
  Administered 2023-11-17: 35 ug/min via INTRAVENOUS

## 2023-11-17 MED ORDER — FIBRIN SEALANT 2 ML SINGLE DOSE KIT
PACK | CUTANEOUS | Status: AC
  Filled 2023-11-17: qty 2

## 2023-11-17 MED ORDER — ONDANSETRON HCL 4 MG/2ML IJ SOLN
INTRAMUSCULAR | Status: AC
  Filled 2023-11-17: qty 2

## 2023-11-17 SURGICAL SUPPLY — 42 items
BAG DECANTER FOR FLEXI CONT (MISCELLANEOUS) ×1 IMPLANT
BLADE SURG SZ11 CARB STEEL (BLADE) ×1 IMPLANT
BRUSH SCRUB EZ 4% CHG (MISCELLANEOUS) ×1 IMPLANT
CHLORAPREP W/TINT 26 (MISCELLANEOUS) ×1 IMPLANT
CLAMP SUTURE YELLOW 5 PAIRS (MISCELLANEOUS) ×1 IMPLANT
CLIP SPRNG 6 S-JAW DBL (CLIP) ×1 IMPLANT
CLIP SPRNG 6MM S-JAW DBL (CLIP) ×1 IMPLANT
DERMABOND ADVANCED .7 DNX12 (GAUZE/BANDAGES/DRESSINGS) ×1 IMPLANT
DRESSING SURGICEL FIBRLLR 1X2 (HEMOSTASIS) ×1 IMPLANT
DRSG SURGICEL FIBRILLAR 1X2 (HEMOSTASIS) ×1 IMPLANT
ELECT CAUTERY BLADE 6.4 (BLADE) ×1 IMPLANT
ELECT REM PT RETURN 9FT ADLT (ELECTROSURGICAL) ×1 IMPLANT
ELECTRODE REM PT RTRN 9FT ADLT (ELECTROSURGICAL) ×1 IMPLANT
GEL ULTRASOUND 20GR AQUASONIC (MISCELLANEOUS) IMPLANT
GLOVE BIO SURGEON STRL SZ7 (GLOVE) ×2 IMPLANT
GOWN STRL REUS W/ TWL LRG LVL3 (GOWN DISPOSABLE) ×2 IMPLANT
GOWN STRL REUS W/TWL 2XL LVL3 (GOWN DISPOSABLE) ×2 IMPLANT
IV NS 500ML BAXH (IV SOLUTION) ×1 IMPLANT
KIT TURNOVER KIT A (KITS) ×1 IMPLANT
LABEL OR SOLS (LABEL) ×1 IMPLANT
LOOP VESSEL MAXI 1X406 RED (MISCELLANEOUS) ×1 IMPLANT
LOOP VESSEL MINI 0.8X406 BLUE (MISCELLANEOUS) ×1 IMPLANT
MANIFOLD NEPTUNE II (INSTRUMENTS) ×1 IMPLANT
NDL FILTER BLUNT 18X1 1/2 (NEEDLE) ×1 IMPLANT
NEEDLE FILTER BLUNT 18X1 1/2 (NEEDLE) ×1 IMPLANT
NS IRRIG 500ML POUR BTL (IV SOLUTION) ×1 IMPLANT
PACK EXTREMITY ARMC (MISCELLANEOUS) ×1 IMPLANT
PAD PREP OB/GYN DISP 24X41 (PERSONAL CARE ITEMS) ×1 IMPLANT
STOCKINETTE 48X4 2 PLY STRL (GAUZE/BANDAGES/DRESSINGS) ×1 IMPLANT
STOCKINETTE STRL 4IN 9604848 (GAUZE/BANDAGES/DRESSINGS) ×1 IMPLANT
SUT MNCRL AB 4-0 PS2 18 (SUTURE) ×1 IMPLANT
SUT PROLENE 6 0 BV (SUTURE) ×4 IMPLANT
SUT SILK 2-0 18XBRD TIE 12 (SUTURE) ×1 IMPLANT
SUT SILK 3-0 18XBRD TIE 12 (SUTURE) ×1 IMPLANT
SUT SILK 4-0 18XBRD TIE 12 (SUTURE) ×1 IMPLANT
SUT VIC AB 3-0 SH 27X BRD (SUTURE) ×1 IMPLANT
SYR 20ML LL LF (SYRINGE) ×1 IMPLANT
SYR 3ML LL SCALE MARK (SYRINGE) ×1 IMPLANT
SYR TB 1ML LL NO SAFETY (SYRINGE) IMPLANT
TAG SUTURE CLAMP YLW 5PR (MISCELLANEOUS) ×1 IMPLANT
TRAP FLUID SMOKE EVACUATOR (MISCELLANEOUS) ×1 IMPLANT
WATER STERILE IRR 500ML POUR (IV SOLUTION) ×1 IMPLANT

## 2023-11-17 NOTE — Anesthesia Preprocedure Evaluation (Signed)
 Anesthesia Evaluation  Patient identified by MRN, date of birth, ID band Patient awake    Reviewed: Allergy & Precautions, H&P , NPO status , Patient's Chart, lab work & pertinent test results, reviewed documented beta blocker date and time   History of Anesthesia Complications Negative for: history of anesthetic complications  Airway Mallampati: III  TM Distance: >3 FB Neck ROM: full    Dental  (+) Dental Advidsory Given, Missing, Poor Dentition, Chipped   Pulmonary neg shortness of breath, neg COPD, neg recent URI, Current Smoker   Pulmonary exam normal breath sounds clear to auscultation       Cardiovascular Exercise Tolerance: Good hypertension, (-) angina (-) Past MI and (-) Cardiac Stents negative cardio ROS Normal cardiovascular exam(-) dysrhythmias (-) Valvular Problems/Murmurs Rhythm:regular Rate:Normal     Neuro/Psych negative neurological ROS  negative psych ROS   GI/Hepatic ,GERD  ,,(+) Hepatitis -, C  Endo/Other  diabetes, Type 1    Renal/GU ESRFRenal disease  negative genitourinary   Musculoskeletal   Abdominal   Peds  Hematology  (+) Blood dyscrasia, anemia   Anesthesia Other Findings Past Medical History: No date: Anemia No date: 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) No date: CKD (chronic kidney disease), stage IV (HCC) No date: Diabetic Charcot foot (HCC) No date: GERD (gastroesophageal reflux disease) No date: Hepatitis C No date: Hypertension No date: MRSA infection     Comment:  in blood per patient 02/14/2022: Nausea & vomiting No date: Polysubstance abuse (HCC)     Comment:  a.) THC + cocaine + TCA + IVDU No date: Secondary hyperparathyroidism (HCC) No date: 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)   Reproductive/Obstetrics negative OB ROS                             Anesthesia Physical Anesthesia Plan  ASA: 3  Anesthesia  Plan: General   Post-op Pain Management:    Induction: Intravenous  PONV Risk Score and Plan: 1 and Ondansetron, Dexamethasone, Midazolam and Treatment may vary due to age or medical condition  Airway Management Planned: LMA and Oral ETT  Additional Equipment:   Intra-op Plan:   Post-operative Plan: Extubation in OR  Informed Consent: I have reviewed the patients History and Physical, chart, labs and discussed the procedure including the risks, benefits and alternatives for the proposed anesthesia with the patient or authorized representative who has indicated his/her understanding and acceptance.     Dental Advisory Given  Plan Discussed with: Anesthesiologist, CRNA and Surgeon  Anesthesia Plan Comments:         Anesthesia Quick Evaluation

## 2023-11-17 NOTE — H&P (Signed)
 [facility] Admission History & Physical  MRN : [id number]  Jason Francis is a 33 y.o. (August 13, 1991) male who presents with chief complaint of No chief complaint on file. Marland Kitchen  History of Present Illness: Patient presents for access placement.  No new issues today. Left arm AVF planned  Current Facility-Administered Medications  Medication Dose Route Frequency Provider Last Rate Last Admin   0.9 %  sodium chloride infusion   Intravenous Continuous Yevette Edwards, MD       ceFAZolin (ANCEF) IVPB 2g/100 mL premix  2 g Intravenous On Call to OR Georgiana Spinner, NP        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)    CKD (chronic kidney disease), stage IV (HCC)    Diabetic Charcot foot (HCC)    GERD (gastroesophageal reflux disease)    Hepatitis C    Hypertension    MRSA infection    in blood per patient   Nausea & vomiting 02/14/2022   Polysubstance abuse (HCC)    a.) THC + cocaine + TCA + IVDU   Secondary hyperparathyroidism (HCC)    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  Vaping Use   Vaping status: Former  Substance Use Topics   Alcohol use: Not Currently   Drug use: Yes    Types: Marijuana, Cocaine, Heroin    Comment: last stated cocaine use ; marijuana daily     Family History  Problem Relation Age of Onset   Heart disease Mother    Hypertension Mother    Obesity Mother    Gout Mother    Thyroid disease Mother    High blood pressure Maternal Grandmother    Dementia Maternal  Grandmother    Heart disease Maternal Grandfather     Allergies  Allergen Reactions   Augmentin [Amoxicillin-Pot Clavulanate] Nausea And Vomiting   Sulfa Antibiotics Nausea And Vomiting     REVIEW OF SYSTEMS (Negative unless checked)   Constitutional: [] Weight loss  [] Fever  [] Chills Cardiac: [] Chest pain   [] Chest pressure   [] Palpitations   [] Shortness of breath when laying flat   [] Shortness of breath at rest   [] Shortness of breath with exertion. Vascular:  [] Pain in legs with walking   [] Pain in legs at rest   [] Pain in legs when laying flat   [] Claudication   [] Pain in feet when walking  [] Pain in feet at rest  [] Pain in feet when laying flat   [] History of DVT   [] Phlebitis   [] Swelling in legs   [] Varicose veins   [] Non-healing ulcers Pulmonary:   [] Uses home oxygen   [] Productive cough   [] Hemoptysis   [] Wheeze  [] COPD   [] Asthma Neurologic:  [] Dizziness  [] Blackouts   [] Seizures   [] History of stroke   [] History of TIA  [] Aphasia   [] Temporary blindness   [] Dysphagia   [] Weakness or numbness in  arms   [] Weakness or numbness in legs Musculoskeletal:  [] Arthritis   [] Joint swelling   [x] Joint pain   [] Low back pain Hematologic:  [] Easy bruising  [] Easy bleeding   [] Hypercoagulable state   [x] Anemic   Gastrointestinal:  [] Blood in stool   [] Vomiting blood  [x] Gastroesophageal reflux/heartburn   [] Abdominal pain Genitourinary:  [x] Chronic kidney disease   [] Difficult urination  [] Frequent urination  [] Burning with urination   [] Hematuria Skin:  [] Rashes   [] Ulcers   [] Wounds Psychological:  [] History of anxiety   []  History of major depression.  Physical Examination  Vitals:   11/17/23 0623  BP: (!) 143/86  Pulse: 83  Resp: 18  Temp: 97.9 F (36.6 C)  SpO2: 100%  Weight: 94.8 kg  Height: 5\' 8"  (1.727 m)   Body mass index is 31.78 kg/m. Gen: WD/WN, NAD. Appears older than stated age. Head: [place]/AT, No temporalis wasting.  Ear/Nose/Throat: Hearing grossly intact, nares w/o  erythema or drainage, oropharynx w/o Erythema/Exudate, poor dentition Eyes: Conjunctiva clear, sclera non-icteric Neck: Trachea midline.  No JVD.  Pulmonary:  Good air movement, respirations not labored, no use of accessory muscles.  Cardiac: RRR, normal S1, S2. Vascular:  Vessel Right Left  Radial Palpable Palpable               Musculoskeletal: M/S 5/5 throughout.  Extremities without ischemic changes.  No deformity or atrophy.  Neurologic: Sensation grossly intact in extremities.  Symmetrical.  Speech is fluent. Motor exam as listed above. Psychiatric: Judgment intact, Mood & affect appropriate for pt's clinical situation. Dermatologic: No rashes or ulcers noted.  No cellulitis or open wounds.      CBC Lab Results  Component Value Date   WBC 9.6 11/16/2023   HGB 11.9 (L) 11/17/2023   HCT 35.0 (L) 11/17/2023   MCV 90.4 11/16/2023   PLT 269 11/16/2023    BMET    Component Value Date/Time   NA 134 (L) 11/17/2023 0646   K 4.7 11/17/2023 0646   CL 103 11/17/2023 0646   CO2 23 11/16/2023 1409   GLUCOSE 200 (H) 11/17/2023 0646   BUN 49 (H) 11/17/2023 0646   CREATININE 4.60 (H) 11/17/2023 0646   CREATININE 4.38 (H) 11/16/2023 1409   CALCIUM 9.0 11/16/2023 1409   GFRNONAA 17 (L) 11/16/2023 1409   Estimated Creatinine Clearance: 25.3 mL/min (A) (by C-G formula based on SCr of 4.6 mg/dL (H)).  COAG No results found for: "INR", "PROTIME"  Radiology No results found.   Assessment/Plan CKD (chronic kidney disease), stage IV (HCC) His noninvasive studies showed normal arterial studies on each side.  His cephalic vein is marginal with a decent size basilic vein on each side as well.  He is right-hand dominant.  I would plan a left arm AV fistula.  I would explore his antecubital fossa, and if the cephalic vein is patent and of large enough size that we can use I will create a fistula with this.  If not, he will need a brachiobasilic AV fistula which will require a second  stage operation.  We explained this to him today.  Risks and benefits of the procedure were discussed with the patient and he is agreeable to proceed.   Essential hypertension, benign blood pressure control important in reducing the progression of atherosclerotic disease. On appropriate oral medications.     Insulin dependent type 1 diabetes mellitus (HCC) blood glucose control important in reducing the progression of atherosclerotic disease. Also, involved in wound healing. On appropriate medications.  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  Still needs a left BKA at some point.    Festus Barren, MD  11/17/2023 7:20 AM

## 2023-11-17 NOTE — Anesthesia Procedure Notes (Signed)
 Procedure Name: LMA Insertion Date/Time: 11/17/2023 7:35 AM  Performed by: Malva Cogan, CRNAPre-anesthesia Checklist: Patient identified, Patient being monitored, Timeout performed, Emergency Drugs available and Suction available Patient Re-evaluated:Patient Re-evaluated prior to induction Oxygen Delivery Method: Circle system utilized Preoxygenation: Pre-oxygenation with 100% oxygen Induction Type: IV induction Ventilation: Mask ventilation without difficulty LMA: LMA inserted LMA Size: 4.0 Tube type: Oral Number of attempts: 1 Placement Confirmation: positive ETCO2 and breath sounds checked- equal and bilateral Tube secured with: Tape Dental Injury: Teeth and Oropharynx as per pre-operative assessment

## 2023-11-17 NOTE — Transfer of Care (Signed)
 Immediate Anesthesia Transfer of Care Note  Patient: Jason Francis  Procedure(s) Performed: ARTERIOVENOUS (AV) FISTULA CREATION (Left: Arm Upper)  Patient Location: PACU  Anesthesia Type:General  Level of Consciousness: awake and alert   Airway & Oxygen Therapy: Patient Spontanous Breathing and Patient connected to face mask oxygen  Post-op Assessment: Report given to RN and Post -op Vital signs reviewed and stable  Post vital signs: Reviewed and stable  Last Vitals:  Vitals Value Taken Time  BP 142/87 11/17/23 0932  Temp    Pulse 75 11/17/23 0936  Resp 16 11/17/23 0936  SpO2 100 % 11/17/23 0936  Vitals shown include unfiled device data.  Last Pain:  Vitals:   11/17/23 0623  PainSc: 3          Complications: No notable events documented.

## 2023-11-17 NOTE — Op Note (Signed)
 [facility]   OPERATIVE NOTE   PROCEDURE: Left brachiocephalic arteriovenous fistula placement  PRE-OPERATIVE DIAGNOSIS: 1.  CKD stage 4  POST-OPERATIVE DIAGNOSIS: 1. CKD stage 4  SURGEON: Festus Barren, MD  ASSISTANT(S): none  ANESTHESIA: general  ESTIMATED BLOOD LOSS: 25 cc  FINDING(S): none  SPECIMEN(S):  none  INDICATIONS:   Jason Francis is a 33 y.o. male who presents with renal failure in need of pemanent dialysis acces.  The patient is scheduled for left arm AVF placement.  He is not yet on dialysis, so a graft would not be placed.  He has a history of IV drug use and there is concern that his veins may not be adequate for fistula creation, but since he is not yet on dialysis the hope is that if we place a fistula this will have plenty of time to mature.  The patient is aware the risks include but are not limited to: bleeding, infection, steal syndrome, nerve damage, ischemic monomelic neuropathy, failure to mature, and need for additional procedures.  The patient is aware of the risks of the procedure and elects to proceed forward.  DESCRIPTION: After full informed written consent was obtained from the patient, the patient was brought back to the operating room and placed supine upon the operating table.  Prior to induction, the patient received IV antibiotics.   After obtaining adequate anesthesia, the patient was then prepped and draped in the standard fashion for a left arm access procedure.  I made a curvilinear incision at the level of the antecubital fossa and dissected through the subcutaneous tissue and fascia to gain exposure of the brachial artery.  This was noted to be patent and adequate in size for fistula creation.  This was dissected out proximally and distally and prepared for control with vessel loops .  I then dissected out the cephalic vein.  This was noted to be patent but somewhat sclerotic from the multiple previous accesses from his  history of IV drug use.  It was hoped that the basilic vein would be larger, but it was also smaller and sclerotic and actually the cephalic vein was the larger of the 2 veins.  As he was not yet on dialysis, the graft is not appropriate and an attempt to the fistula today would be made as he hopefully has many months before he would actually start dialysis.  I then gave the patient 3000 units of intravenous heparin.  The vein was marked for orientation and the distal segment of the vein was ligated with a  2-0 silk, and the vein was transected.  I then performed serial.  Dilators for the thickened vein.  I was able to serially dilate the vein up to 3.5 mm in diameter.  I then instilled the heparinized saline into the vein and clamped it.  At this point, I reset my exposure of the brachial artery and pulled up control on the vessel loops.  I made an arteriotomy with a #11 blade, and then I extended the arteriotomy with a Potts scissor.  I injected heparinized saline proximal and distal to this arteriotomy.  The vein was then sewn to the artery in an end-to-side configuration with a running stitch of 6-0 Prolene.  Prior to completing this anastomosis, I allowed the vein and artery to backbleed.  There was no evidence of clot from any vessels.  I completed the anastomosis in the usual fashion and then released all vessel loops and clamps.  There was  a palpable  thrill in the venous outflow, and there was a palpable pulse in the artery distal to the anastomosis.  Due to the sclerotic nature of the vein and the extensive scar tissue, there were several areas on the vein that required 6-0 Prolene sutures for hemostasis.  There was also some hematoma up the arm and so I extended the incision proximally and dissected this out.  Again, a couple of areas on the vein required 6-0 Prolene sutures.  At this point, I irrigated out the surgical wound.  Surgicel  and Vistacel were placed. There was no further active bleeding.  The  subcutaneous tissue was reapproximated with a running stitch of 3-0 Vicryl.  The skin was then closed with a 4-0 Monocryl suture.  The skin was then cleaned, dried, and reinforced with Dermabond.  The patient tolerated this procedure well and was taken to the recovery room in stable condition  COMPLICATIONS: None  CONDITION: Stable   Festus Barren    11/17/2023, 10:19 AM  This note was created with Dragon Medical transcription system. Any errors in dictation are purely unintentional.

## 2023-11-18 ENCOUNTER — Encounter: Payer: Self-pay | Admitting: Vascular Surgery

## 2023-11-28 NOTE — Anesthesia Postprocedure Evaluation (Signed)
 Anesthesia Post Note  Patient: Jason Francis  Procedure(s) Performed: ARTERIOVENOUS (AV) FISTULA CREATION (Left: Arm Upper)  Patient location during evaluation: PACU Anesthesia Type: General Level of consciousness: awake and alert Pain management: pain level controlled Vital Signs Assessment: post-procedure vital signs reviewed and stable Respiratory status: spontaneous breathing, nonlabored ventilation, respiratory function stable and patient connected to nasal cannula oxygen Cardiovascular status: blood pressure returned to baseline and stable Postop Assessment: no apparent nausea or vomiting Anesthetic complications: no   No notable events documented.   Last Vitals:  Vitals:   11/17/23 1030 11/17/23 1037  BP:  (!) 139/91  Pulse: 83 73  Resp: 13 16  Temp:  37.2 C  SpO2: 96% 95%    Last Pain:  Vitals:   11/17/23 1037  TempSrc: Temporal  PainSc: 6                  Lenard Simmer

## 2023-12-01 ENCOUNTER — Encounter: Payer: Self-pay | Admitting: Internal Medicine

## 2023-12-08 ENCOUNTER — Encounter: Payer: Self-pay | Admitting: Nurse Practitioner

## 2023-12-08 ENCOUNTER — Ambulatory Visit (INDEPENDENT_AMBULATORY_CARE_PROVIDER_SITE_OTHER): Payer: Commercial Managed Care - HMO | Admitting: Nurse Practitioner

## 2023-12-08 VITALS — BP 120/80 | HR 72 | Temp 98.3°F | Resp 16 | Ht 68.0 in | Wt 207.6 lb

## 2023-12-08 DIAGNOSIS — K219 Gastro-esophageal reflux disease without esophagitis: Secondary | ICD-10-CM

## 2023-12-08 DIAGNOSIS — E1022 Type 1 diabetes mellitus with diabetic chronic kidney disease: Secondary | ICD-10-CM

## 2023-12-08 DIAGNOSIS — N184 Chronic kidney disease, stage 4 (severe): Secondary | ICD-10-CM

## 2023-12-08 DIAGNOSIS — I10 Essential (primary) hypertension: Secondary | ICD-10-CM

## 2023-12-08 MED ORDER — PANTOPRAZOLE SODIUM 40 MG PO TBEC: 40.0000 mg | DELAYED_RELEASE_TABLET | Freq: Every day | ORAL | 1 refills | Status: DC

## 2023-12-08 MED ORDER — INSULIN NPH (HUMAN) (ISOPHANE) 100 UNIT/ML ~~LOC~~ SUSP: 20.0000 [IU] | Freq: Two times a day (BID) | SUBCUTANEOUS | 11 refills | Status: DC

## 2023-12-08 MED ORDER — INSULIN REGULAR HUMAN 100 UNIT/ML IJ SOLN: INTRAMUSCULAR | 6 refills | Status: AC

## 2023-12-08 NOTE — Progress Notes (Signed)
 [facility] [address] [place], [place] [address]  Internal MEDICINE  Office Visit Note  Patient Name: Jason Francis  [id number]  [id number]  Date of Service: 12/08/2023  Chief Complaint  Patient presents with   Diabetes   Gastroesophageal Reflux   Hypertension   Follow-up    HPI Jason Francis presents for a follow-up visit for diabetes, CKD, and GERD.  Diabetes -- A1c is elevated at 7.4, not time to recheck this yet.  CKD -- has a new AV fistula on the left arm. Will follow up with vascular surgery in early may and then start hemodialysis sometime after that. GERD -- takes pantoprazole  Hypertension -- controlled with current medications.     Current Medication: Outpatient Encounter Medications as of 12/08/2023  Medication Sig   amLODipine (NORVASC) 10 MG tablet Take 10 mg by mouth every evening.   calcitRIOL (ROCALTROL) 0.25 MCG capsule Take 0.25 mcg by mouth daily.   carvedilol (COREG) 6.25 MG tablet Take 6.25 mg by mouth 2 (two) times daily with a meal.   cloNIDine (CATAPRES) 0.1 MG tablet Take 0.1 mg by mouth 2 (two) times daily.   Continuous Glucose Sensor (DEXCOM G7 SENSOR) MISC Use one every 10 days for uncontrolled dm   cyclobenzaprine (FLEXERIL) 10 MG tablet Take 1 tablet (10 mg total) by mouth 2 (two) times daily as needed for muscle spasms.   Ferrous Sulfate (IRON) 28 MG TABS Take 2 tablets by mouth daily.   methadone (DOLOPHINE) 1 MG/1ML solution Take 95 mg by mouth daily. Methadone clinic   metoCLOPramide (REGLAN) 10 MG tablet TAKE 1 TABLET BY MOUTH TWICE DAILY AS NEEDED FOR NAUSEA OR VOMITING   sodium bicarbonate 650 MG tablet Take 650 mg by mouth 2 (two) times daily.   sodium zirconium cyclosilicate (LOKELMA) 10 g PACK packet Take 10 g by mouth every other day.   [DISCONTINUED] insulin lispro (HUMALOG) 100 UNIT/ML injection Use insulin with omnipod, 200 units maximum every 72 hours   [DISCONTINUED] insulin NPH Human (NOVOLIN N) 100 UNIT/ML injection  Inject 0.2 mLs (20 Units total) into the skin 2 (two) times daily before a meal. (Patient taking differently: Inject 15 Units into the skin 2 (two) times daily before a meal.)   [DISCONTINUED] Insulin Pen Needle (PEN NEEDLES 31GX5/16") 31G X 8 MM MISC 1 each by Does not apply route as directed.   [DISCONTINUED] insulin regular (HUMULIN R) 100 units/mL injection INJECT INTO SKIN BEFORE MEALS ACCORDING TO SLIDING SCALE - MAX DOSE 48 UNITS IN 24 HOURS   [DISCONTINUED] oxyCODONE-acetaminophen (PERCOCET) 7.5-325 MG tablet Take 1 tablet by mouth every 4 (four) hours as needed for severe pain (pain score 7-10).   [DISCONTINUED] pantoprazole (PROTONIX) 40 MG tablet Take 1 tablet (40 mg total) by mouth daily.   insulin NPH Human (NOVOLIN N) 100 UNIT/ML injection Inject 0.2 mLs (20 Units total) into the skin 2 (two) times daily before a meal.   insulin regular (HUMULIN R) 100 units/mL injection INJECT INTO SKIN BEFORE MEALS ACCORDING TO SLIDING SCALE - MAX DOSE 48 UNITS IN 24 HOURS   pantoprazole (PROTONIX) 40 MG tablet Take 1 tablet (40 mg total) by mouth daily.   No facility-administered encounter medications on file as of 12/08/2023.    Surgical History: Past Surgical History:  Procedure Laterality Date   APPENDECTOMY     AV FISTULA PLACEMENT Left 11/17/2023   Procedure: ARTERIOVENOUS (AV) FISTULA CREATION;  Surgeon: Annice Needy, MD;  Location: [facility] ORS;  Service: Vascular;  Laterality:  Left;   FOOT SURGERY Left 2019   MYRINGOTOMY      Medical History: 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)    CKD (chronic kidney disease), stage IV (HCC)    Diabetic Charcot foot (HCC)    GERD (gastroesophageal reflux disease)    Hepatitis C    Hypertension    MRSA infection    in blood per patient   Nausea & vomiting 02/14/2022   Polysubstance abuse (HCC)    a.) THC + cocaine + TCA + IVDU   Secondary hyperparathyroidism (HCC)    Type 1  diabetes mellitus with chronic kidney disease (HCC)     Family History: Family History  Problem Relation Age of Onset   Heart disease Mother    Hypertension Mother    Obesity Mother    Gout Mother    Thyroid disease Mother    High blood pressure Maternal Grandmother    Dementia Maternal Grandmother    Heart disease Maternal Grandfather     Social History   Socioeconomic History   Marital status: Media planner    Spouse name: Not on file   Number of children: Not on file   Years of education: Not on file   Highest education level: Not on file  Occupational History   Not on file  Tobacco Use   Smoking status: Every Day    Current packs/day: 1.00    Average packs/day: 1 pack/day for 21.3 years (21.3 ttl pk-yrs)    Types: Cigarettes    Start date: 08/30/2002   Smokeless tobacco: Never   Tobacco comments:    1/2 pack daily.  Vaping Use   Vaping status: Former  Substance and Sexual Activity   Alcohol use: Not Currently   Drug use: Yes    Types: Marijuana, Cocaine, Heroin    Comment: last stated cocaine use ; marijuana daily   Sexual activity: Yes    Partners: Female  Other Topics Concern   Not on file  Social History Narrative   Not on file   Social Drivers of Health   Financial Resource Strain: Low Risk  (03/01/2018)   Received from [facility], [facility]   Overall Financial Resource Strain (CARDIA)    Difficulty of Paying Living Expenses: Not hard at all  Food Insecurity: No Food Insecurity (01/21/2023)   Hunger Vital Sign    Worried About Running Out of Food in the Last Year: Never true    Ran Out of Food in the Last Year: Never true  Transportation Needs: No Transportation Needs (01/21/2023)   PRAPARE - Administrator, Civil Service (Medical): No    Lack of Transportation (Non-Medical): No  Physical Activity: Inactive (03/01/2018)   Received from [facility], [facility]    Exercise Vital Sign    Days of Exercise per Week: 0 days    Minutes of Exercise per Session: 0 min  Stress: Stress Concern Present (03/01/2018)   Received from [facility], [facility]   [employer] of Occupational Health - Occupational Stress Questionnaire    Feeling of Stress : Rather much  Social Connections: Unknown (03/01/2018)   Received from [facility], [facility]   Social Connection and Isolation Panel [NHANES]    Frequency of Communication with Friends and Family: Three times a week    Frequency  of Social Gatherings with Friends and Family: Three times a week    Attends Religious Services: Never    Active Member of Clubs or Organizations: No    Attends Banker Meetings: Never    Marital Status: Not on file  Intimate Partner Violence: Not At Risk (01/21/2023)   Humiliation, Afraid, Rape, and Kick questionnaire    Fear of Current or Ex-Partner: No    Emotionally Abused: No    Physically Abused: No    Sexually Abused: No      Review of Systems  Constitutional:  Negative for chills, fatigue and unexpected weight change.  HENT:  Positive for dental problem and postnasal drip. Negative for congestion, rhinorrhea, sneezing and sore throat.   Eyes:  Negative for redness.  Respiratory: Negative.  Negative for cough, chest tightness, shortness of breath and wheezing.   Cardiovascular: Negative.  Negative for chest pain and palpitations.  Gastrointestinal:  Negative for abdominal pain, constipation, diarrhea, nausea and vomiting.  Genitourinary:  Negative for dysuria and frequency.  Musculoskeletal:  Positive for arthralgias (Left neck pain), gait problem (Due to Charcot's joint of the left ankle, ambulates with crutches) and neck pain (Left side). Negative for back pain and joint swelling.  Skin: Negative.  Negative for rash.  Neurological:  Negative for tremors and numbness.  Hematological:   Negative for adenopathy. Does not bruise/bleed easily.  Psychiatric/Behavioral:  Negative for behavioral problems (Depression), sleep disturbance and suicidal ideas. The patient is not nervous/anxious.     Vital Signs: BP 120/80 Comment: 140/78  Pulse 72   Temp 98.3 F (36.8 C)   Resp 16   Ht 5\' 8"  (1.727 m)   Wt 207 lb 9.6 oz (94.2 kg)   SpO2 99%   BMI 31.57 kg/m    Physical Exam Vitals reviewed.  Constitutional:      General: He is not in acute distress.    Appearance: Normal appearance. He is not ill-appearing.  HENT:     Head: Normocephalic and atraumatic.     Mouth/Throat:     Dentition: Abnormal dentition. Dental tenderness, gingival swelling and dental caries present.  Eyes:     Pupils: Pupils are equal, round, and reactive to light.  Cardiovascular:     Rate and Rhythm: Normal rate and regular rhythm.  Pulmonary:     Effort: Pulmonary effort is normal. No respiratory distress.  Neurological:     Mental Status: He is alert and oriented to person, place, and time.     Gait: Gait abnormal (Due to Charcot's joint of the left ankle).  Psychiatric:        Mood and Affect: Mood normal.        Behavior: Behavior normal.        Assessment/Plan: 1. 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 Take insulin as prescribed.  - insulin NPH Human (NOVOLIN N) 100 UNIT/ML injection; Inject 0.2 mLs (20 Units total) into the skin 2 (two) times daily before a meal.  Dispense: 20 mL; Refill: 11 - insulin regular (HUMULIN R) 100 units/mL injection; INJECT INTO SKIN BEFORE MEALS ACCORDING TO SLIDING SCALE - MAX DOSE 48 UNITS IN 24 HOURS  Dispense: 20 mL; Refill: 6  2. Essential hypertension, benign Stable, continue medications as prescribed.   3. CKD (chronic kidney disease), stage IV (HCC) Follow up with nephrology about when to start hemodialysis.   4. Gastroesophageal reflux disease without esophagitis Continue  pantoprazole as  prescribed.  - pantoprazole (PROTONIX) 40 MG tablet; Take 1 tablet (40 mg total) by mouth daily.  Dispense: 90 tablet; Refill: 1   General Counseling: tiegan terpstra understanding of the findings of todays visit and agrees with plan of treatment. I have discussed any further diagnostic evaluation that may be needed or ordered today. We also reviewed his medications today. he has been encouraged to call the office with any questions or concerns that should arise related to todays visit.    No orders of the defined types were placed in this encounter.   Meds ordered this encounter  Medications   pantoprazole (PROTONIX) 40 MG tablet    Sig: Take 1 tablet (40 mg total) by mouth daily.    Dispense:  90 tablet    Refill:  1   insulin NPH Human (NOVOLIN N) 100 UNIT/ML injection    Sig: Inject 0.2 mLs (20 Units total) into the skin 2 (two) times daily before a meal.    Dispense:  20 mL    Refill:  11    Dx code E11.65, for future refills   insulin regular (HUMULIN R) 100 units/mL injection    Sig: INJECT INTO SKIN BEFORE MEALS ACCORDING TO SLIDING SCALE - MAX DOSE 48 UNITS IN 24 HOURS    Dispense:  20 mL    Refill:  6    Dx code E11.65, for future refills    Return in about 3 months (around 03/08/2024) for F/U, Recheck A1C, Shenelle Klas PCP.   Total time spent:30 Minutes Time spent includes review of chart, medications, test results, and follow up plan with the patient.   [place] Controlled Substance Database was reviewed by me.  This patient was seen by Sallyanne Kuster, FNP-C in collaboration with Dr. Beverely Risen as a part of collaborative care agreement.   Seairra Otani R. Tedd Sias, MSN, FNP-C Internal medicine

## 2023-12-09 ENCOUNTER — Telehealth (INDEPENDENT_AMBULATORY_CARE_PROVIDER_SITE_OTHER): Payer: Self-pay | Admitting: Nurse Practitioner

## 2023-12-09 NOTE — Telephone Encounter (Signed)
 Pt had surgery on 11/17/23 for fistula placement with Dr Wyn Quaker. Pt states that on 12/07/23 where the fistula was placed just started profusely bleeding and would not stop after the glue came off. Pt states he called 911 and they stopped the bleeding and wrapped it up. Pt states that is still bleeding some. Please advise. Pt call back number is [phone].

## 2023-12-09 NOTE — Telephone Encounter (Signed)
 Bring him in for a nurse visit, I'll try to see if I can dermabond it.  However if it is bleeding heavily we would need to take him to the ED

## 2023-12-12 ENCOUNTER — Encounter: Payer: Self-pay | Admitting: Internal Medicine

## 2023-12-12 ENCOUNTER — Ambulatory Visit (INDEPENDENT_AMBULATORY_CARE_PROVIDER_SITE_OTHER)

## 2023-12-14 ENCOUNTER — Encounter (INDEPENDENT_AMBULATORY_CARE_PROVIDER_SITE_OTHER): Payer: Self-pay

## 2023-12-14 ENCOUNTER — Ambulatory Visit (INDEPENDENT_AMBULATORY_CARE_PROVIDER_SITE_OTHER): Admitting: Nurse Practitioner

## 2023-12-14 ENCOUNTER — Telehealth (INDEPENDENT_AMBULATORY_CARE_PROVIDER_SITE_OTHER): Payer: Self-pay

## 2023-12-14 VITALS — BP 131/79 | HR 74 | Resp 18

## 2023-12-14 DIAGNOSIS — N184 Chronic kidney disease, stage 4 (severe): Secondary | ICD-10-CM

## 2023-12-14 NOTE — Telephone Encounter (Signed)
Please contact patient to schedule appointment.

## 2023-12-14 NOTE — Telephone Encounter (Signed)
 Is it bright red blood or kind of pink/yellow ish

## 2023-12-14 NOTE — Telephone Encounter (Signed)
 Patient states not significant bleeding but is changing dressing every 6 to 8 hours

## 2023-12-14 NOTE — Telephone Encounter (Signed)
 Ok, bring him in and we can try to re-glue and do something a bit different dressing wise

## 2023-12-14 NOTE — Telephone Encounter (Signed)
 Nurse scheduled for today

## 2023-12-14 NOTE — Telephone Encounter (Signed)
 Patient left a message stating that the bleeding started back yesterday at the incision area. Patient wrapped the area and removed this morning. The patient states the incision is still bleeding and the glue is loose. Please Advise

## 2023-12-14 NOTE — Progress Notes (Signed)
 Scab came off of patient's wound.  Noted bruising and small amount of lidocaine with epi was applied to the area.  Slight compression bandage applied.

## 2023-12-14 NOTE — Telephone Encounter (Signed)
red

## 2023-12-14 NOTE — Telephone Encounter (Signed)
 Is he having significant bleeding or just little dribbles?

## 2023-12-19 NOTE — Telephone Encounter (Signed)
Put him on my schedule tomorrow

## 2023-12-20 ENCOUNTER — Encounter (INDEPENDENT_AMBULATORY_CARE_PROVIDER_SITE_OTHER): Payer: Self-pay | Admitting: Nurse Practitioner

## 2023-12-20 ENCOUNTER — Ambulatory Visit (INDEPENDENT_AMBULATORY_CARE_PROVIDER_SITE_OTHER): Payer: MEDICAID | Admitting: Nurse Practitioner

## 2023-12-20 VITALS — BP 131/78 | HR 68 | Resp 16

## 2023-12-20 DIAGNOSIS — N184 Chronic kidney disease, stage 4 (severe): Secondary | ICD-10-CM

## 2023-12-21 ENCOUNTER — Encounter (INDEPENDENT_AMBULATORY_CARE_PROVIDER_SITE_OTHER): Payer: Self-pay | Admitting: Nurse Practitioner

## 2023-12-21 NOTE — Progress Notes (Signed)
 Subjective:    Patient ID: Jason Francis, male    DOB: 1991/07/09, 33 y.o.   MRN: [id number] Chief Complaint  Patient presents with   Follow-up    Wound check    The patient returns today for wound evaluation due to a persistent ooze following his recent left brachiocephalic AV fistula placement.  It was previously Dermabond but this is commonly used and he continues to have oozing and a small wound in the area.  Today we have placed several on the wound but it is noticed that the patient no longer has a palpable thrill or audible bruit.  When he was seen last week he had a strong thrill at that time.    Review of Systems  Skin:  Positive for wound.  Neurological:  Positive for weakness.  All other systems reviewed and are negative.      Objective:   Physical Exam Vitals reviewed.  HENT:     Head: Normocephalic.  Cardiovascular:     Rate and Rhythm: Normal rate.  Pulmonary:     Effort: Pulmonary effort is normal.  Skin:    General: Skin is warm and dry.  Neurological:     Mental Status: He is alert and oriented to person, place, and time.  Psychiatric:        Mood and Affect: Mood normal.        Behavior: Behavior normal.        Thought Content: Thought content normal.        Judgment: Judgment normal.     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  Vaping Use   Vaping status: Former  Substance and Sexual Activity   Alcohol use: Not Currently   Drug use: Yes    Types: Marijuana, Cocaine, Heroin    Comment: last stated cocaine use ; marijuana daily   Sexual activity: Yes    Partners: Female  Other Topics Concern   Not on file  Social History Narrative   Not on file   Social Drivers of Health   Financial Resource Strain: Low Risk  (03/01/2018)   Received from [facility], [facility]   Overall Financial Resource Strain (CARDIA)  Difficulty of Paying Living Expenses: Not hard at all  Food Insecurity: No Food Insecurity (01/21/2023)   Hunger Vital Sign    Worried About Running Out of Food in the Last Year: Never true    Ran Out of Food in the Last Year: Never true  Transportation Needs: No Transportation Needs (01/21/2023)   PRAPARE - Administrator, Civil Service (Medical): No    Lack of Transportation (Non-Medical): No  Physical Activity: Inactive (03/01/2018)   Received from [facility], [facility]   Exercise Vital Sign    Days of Exercise per Week: 0 days    Minutes of Exercise per Session: 0 min  Stress: Stress Concern Present (03/01/2018)   Received from [facility], [facility]   [employer] of Occupational Health - Occupational Stress Questionnaire    Feeling of Stress : Rather much  Social Connections: Unknown (03/01/2018)   Received from [facility], [facility]   Social Connection and Isolation  Panel [NHANES]    Frequency of Communication with Friends and Family: Three times a week    Frequency of Social Gatherings with Friends and Family: Three times a week    Attends Religious Services: Never    Active Member of Clubs or Organizations: No    Attends Banker Meetings: Never    Marital Status: Not on file  Intimate Partner Violence: Not At Risk (01/21/2023)   Humiliation, Afraid, Rape, and Kick questionnaire    Fear of Current or Ex-Partner: No    Emotionally Abused: No    Physically Abused: No    Sexually Abused: No    Past Surgical History:  Procedure Laterality Date   APPENDECTOMY     AV FISTULA PLACEMENT Left 11/17/2023   Procedure: ARTERIOVENOUS (AV) FISTULA CREATION;  Surgeon: Celso College, MD;  Location: [facility] ORS;  Service: Vascular;  Laterality: Left;   FOOT SURGERY Left 2019   MYRINGOTOMY      Family History  Problem Relation Age of Onset   Heart disease Mother    Hypertension Mother    Obesity Mother    Gout Mother    Thyroid disease Mother    High blood pressure Maternal Grandmother    Dementia Maternal Grandmother    Heart disease Maternal Grandfather     Allergies  Allergen Reactions   Augmentin [Amoxicillin-Pot Clavulanate] Nausea And Vomiting   Sulfa Antibiotics Nausea And Vomiting       Latest Ref Rng & Units 11/17/2023    6:46 AM 11/16/2023    2:09 PM 11/04/2023    9:22 AM  CBC  WBC 4.0 - 10.5 K/uL  9.6  9.8   Hemoglobin 13.0 - 17.0 g/dL 16.1  09.6  04.5   Hematocrit 39.0 - 52.0 % 35.0  34.0  30.6   Platelets 150 - 400 K/uL  269  273       CMP     Component Value Date/Time   NA 134 (L) 11/17/2023 0646   K 4.7 11/17/2023 0646   CL 103 11/17/2023 0646   CO2 23 11/16/2023 1409   GLUCOSE 200 (H) 11/17/2023 0646   BUN 49 (H) 11/17/2023 0646   CREATININE 4.60 (H) 11/17/2023 0646   CREATININE 4.38 (H) 11/16/2023 1409   CALCIUM 9.0 11/16/2023 1409   PROT 8.8 (H) 11/16/2023 1409   ALBUMIN 3.9 11/16/2023 1409   AST 33  11/16/2023 1409   ALT 40 11/16/2023  1409   ALKPHOS 97 11/16/2023 1409   BILITOT 0.5 11/16/2023 1409   GFRNONAA 17 (L) 11/16/2023 1409     No results found.     Assessment & Plan:   1. CKD (chronic kidney disease), stage IV (HCC) (Primary) Recommend:  The patient is experiencing increasing problems with their dialysis access.  Patient should have a fistulagram with the intention for intervention.  The intention for intervention is to restore appropriate flow and prevent thrombosis and possible loss of the access.  As well as improve the quality of dialysis therapy.  The risks, benefits and alternative therapies were reviewed in detail with the patient.  All questions were answered.  The patient agrees to proceed with angio/intervention.    The patient will follow up with me in the office after the procedure.   We will also evaluate the patient's wound at that time.   Current Outpatient Medications on File Prior to Visit  Medication Sig Dispense Refill   amLODipine  (NORVASC ) 10 MG tablet Take 10 mg by mouth every evening.     calcitRIOL (ROCALTROL) 0.25 MCG capsule Take 0.25 mcg by mouth daily.     carvedilol (COREG) 6.25 MG tablet Take 6.25 mg by mouth 2 (two) times daily with a meal.     cloNIDine (CATAPRES) 0.1 MG tablet Take 0.1 mg by mouth 2 (two) times daily.     Continuous Glucose Sensor (DEXCOM G7 SENSOR) MISC Use one every 10 days for uncontrolled dm 3 each 11   cyclobenzaprine  (FLEXERIL ) 10 MG tablet Take 1 tablet (10 mg total) by mouth 2 (two) times daily as needed for muscle spasms. 60 tablet 3   Ferrous Sulfate (IRON ) 28 MG TABS Take 2 tablets by mouth daily.     insulin  NPH Human (NOVOLIN  N) 100 UNIT/ML injection Inject 0.2 mLs (20 Units total) into the skin 2 (two) times daily before a meal. 20 mL 11   insulin  regular (HUMULIN R ) 100 units/mL injection INJECT INTO SKIN BEFORE MEALS ACCORDING TO SLIDING SCALE - MAX DOSE 48 UNITS IN 24 HOURS 20 mL 6   methadone   (DOLOPHINE ) 1 MG/1ML solution Take 95 mg by mouth daily. Methadone  clinic     metoCLOPramide  (REGLAN ) 10 MG tablet TAKE 1 TABLET BY MOUTH TWICE DAILY AS NEEDED FOR NAUSEA OR VOMITING 180 tablet 0   pantoprazole  (PROTONIX ) 40 MG tablet Take 1 tablet (40 mg total) by mouth daily. 90 tablet 1   sodium bicarbonate 650 MG tablet Take 650 mg by mouth 2 (two) times daily.     sodium zirconium cyclosilicate (LOKELMA) 10 g PACK packet Take 10 g by mouth every other day.     No current facility-administered medications on file prior to visit.    There are no Patient Instructions on file for this visit. No follow-ups on file.   Jolly Bleicher E Adalynn Corne, NP

## 2023-12-26 ENCOUNTER — Telehealth (INDEPENDENT_AMBULATORY_CARE_PROVIDER_SITE_OTHER): Payer: Self-pay

## 2023-12-26 ENCOUNTER — Other Ambulatory Visit (INDEPENDENT_AMBULATORY_CARE_PROVIDER_SITE_OTHER): Payer: Self-pay | Admitting: Vascular Surgery

## 2023-12-26 DIAGNOSIS — N184 Chronic kidney disease, stage 4 (severe): Secondary | ICD-10-CM

## 2023-12-26 DIAGNOSIS — Z9889 Other specified postprocedural states: Secondary | ICD-10-CM

## 2023-12-26 NOTE — Telephone Encounter (Signed)
 Has it been bleeding through the wrap he had placed? If not I would leave it in place for now

## 2023-12-26 NOTE — Telephone Encounter (Signed)
 Soft is ok, tell him to place a dressing and leave it there.  Changing it repeatedly actually can cause him to bleed more

## 2023-12-26 NOTE — Telephone Encounter (Signed)
 Message given

## 2023-12-26 NOTE — Telephone Encounter (Signed)
 He changed it and it had a clot on it, but hadn't bleed through. He said should the clot be hard or or soft in the wrapping? He thinks its soft because of the blood that was in the gauze.

## 2023-12-26 NOTE — Telephone Encounter (Signed)
 Patient stated he came in and his arm was bleeding (Dialysis access) and his arm was wrapped. he He's been waiting on an appt for surgery, but was wondering when he can change the wrap or will he have to come her to have it unwrapped because of the bleeding.

## 2023-12-27 ENCOUNTER — Ambulatory Visit (INDEPENDENT_AMBULATORY_CARE_PROVIDER_SITE_OTHER): Admitting: Vascular Surgery

## 2023-12-27 ENCOUNTER — Telehealth (INDEPENDENT_AMBULATORY_CARE_PROVIDER_SITE_OTHER): Payer: Self-pay

## 2023-12-27 VITALS — BP 156/88 | HR 71 | Resp 17 | Ht 68.0 in | Wt 207.0 lb

## 2023-12-27 DIAGNOSIS — E43 Unspecified severe protein-calorie malnutrition: Secondary | ICD-10-CM

## 2023-12-27 DIAGNOSIS — N184 Chronic kidney disease, stage 4 (severe): Secondary | ICD-10-CM

## 2023-12-27 DIAGNOSIS — M86372 Chronic multifocal osteomyelitis, left ankle and foot: Secondary | ICD-10-CM

## 2023-12-27 DIAGNOSIS — E10618 Type 1 diabetes mellitus with other diabetic arthropathy: Secondary | ICD-10-CM

## 2023-12-27 DIAGNOSIS — I10 Essential (primary) hypertension: Secondary | ICD-10-CM

## 2023-12-27 NOTE — Assessment & Plan Note (Signed)
 Have planned left BKA on this for many months.

## 2023-12-27 NOTE — Telephone Encounter (Signed)
 See if he can come and see JD today

## 2023-12-27 NOTE — Assessment & Plan Note (Signed)
 Attempts at his left brachiocephalic AV fistula have failed.  The vein was poor.  He has an open wound with some hematoma that needs evacuated and the wound managed.  He will likely need a VAC for several weeks to get the wound healed.  He has multiple issues making his healing poor including low protein levels and his type 1 diabetes with suboptimal control.  No new access attempt will be considered until this wound is completely closed for several weeks.

## 2023-12-27 NOTE — Assessment & Plan Note (Signed)
 Affects wound healing

## 2023-12-27 NOTE — Assessment & Plan Note (Signed)
 blood glucose control important in reducing the progression of atherosclerotic disease. Also, involved in wound healing. On appropriate medications.

## 2023-12-27 NOTE — Progress Notes (Signed)
 MRN : [id number]  Jason Francis is a 33 y.o. (March 20, 1991) male who presents with chief complaint of No chief complaint on file. Aaron Aas  History of Present Illness: Patient returns today in follow up of his left brachiocephalic AV fistula.  This was created about 6 weeks ago due to stage IV chronic kidney disease.  He had a poor vein seen at the time of surgery, but given the fact that he was not yet on dialysis we attempted a fistula in hopes of avoiding a graft as he had not yet started dialysis.  The fistula has failed.  He now has a dime sized wound at the antecubital fossa with old hematoma present.  Current Outpatient Medications  Medication Sig Dispense Refill   amLODipine  (NORVASC ) 10 MG tablet Take 10 mg by mouth every evening.     calcitRIOL (ROCALTROL) 0.25 MCG capsule Take 0.25 mcg by mouth daily.     carvedilol (COREG) 6.25 MG tablet Take 6.25 mg by mouth 2 (two) times daily with a meal.     cloNIDine (CATAPRES) 0.1 MG tablet Take 0.1 mg by mouth 2 (two) times daily.     Continuous Glucose Sensor (DEXCOM G7 SENSOR) MISC Use one every 10 days for uncontrolled dm 3 each 11   cyclobenzaprine  (FLEXERIL ) 10 MG tablet Take 1 tablet (10 mg total) by mouth 2 (two) times daily as needed for muscle spasms. 60 tablet 3   Ferrous Sulfate (IRON ) 28 MG TABS Take 2 tablets by mouth daily.     insulin  NPH Human (NOVOLIN  N) 100 UNIT/ML injection Inject 0.2 mLs (20 Units total) into the skin 2 (two) times daily before a meal. 20 mL 11   insulin  regular (HUMULIN R ) 100 units/mL injection INJECT INTO SKIN BEFORE MEALS ACCORDING TO SLIDING SCALE - MAX DOSE 48 UNITS IN 24 HOURS 20 mL 6   methadone  (DOLOPHINE ) 1 MG/1ML solution Take 95 mg by mouth daily. Methadone  clinic     metoCLOPramide  (REGLAN ) 10 MG tablet TAKE 1 TABLET BY MOUTH TWICE DAILY AS NEEDED FOR NAUSEA OR VOMITING 180 tablet 0   pantoprazole  (PROTONIX ) 40 MG tablet Take 1 tablet (40 mg total) by mouth daily. 90 tablet 1   sodium bicarbonate 650  MG tablet Take 650 mg by mouth 2 (two) times daily.     sodium zirconium cyclosilicate (LOKELMA) 10 g PACK packet Take 10 g by mouth every other day.     No current facility-administered medications for this visit.    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  Vaping Use   Vaping status: Former  Substance Use Topics   Alcohol use: Not Currently   Drug use: Yes    Types: Marijuana, Cocaine, Heroin    Comment: last stated cocaine use ; marijuana daily      Family History  Problem Relation Age of Onset   Heart disease Mother    Hypertension Mother    Obesity Mother    Gout Mother    Thyroid disease Mother    High blood pressure Maternal Grandmother    Dementia Maternal Grandmother    Heart disease Maternal Grandfather     Allergies  Allergen Reactions   Augmentin [Amoxicillin-Pot Clavulanate] Nausea And Vomiting   Sulfa Antibiotics Nausea And Vomiting     REVIEW  OF SYSTEMS (Negative unless checked)  Constitutional: [] Weight loss  [] Fever  [] Chills Cardiac: [] Chest pain   [] Chest pressure   [] Palpitations   [] Shortness of breath when laying flat   [] Shortness of breath at rest   [] Shortness of breath with exertion. Vascular:  [] Pain in legs with walking   [] Pain in legs at rest   [] Pain in legs when laying flat   [] Claudication   [] Pain in feet when walking  [] Pain in feet at rest  [] Pain in feet when laying flat   [] History of DVT   [] Phlebitis   [] Swelling in legs   [] Varicose veins   [x] Non-healing ulcers Pulmonary:   [] Uses home oxygen   [] Productive cough   [] Hemoptysis   [] Wheeze  [] COPD   [] Asthma Neurologic:  [] Dizziness  [] Blackouts   [] Seizures   [] History of stroke   [] History of TIA  [] Aphasia   [] Temporary blindness   [] Dysphagia   [] Weakness or numbness in arms   [] Weakness or numbness in legs Musculoskeletal:  [x] Arthritis   [] Joint swelling   [x] Joint pain   [] Low back pain Hematologic:  [] Easy bruising  [] Easy bleeding   [] Hypercoagulable state   [x] Anemic   Gastrointestinal:  [] Blood in stool   [] Vomiting blood  [] Gastroesophageal reflux/heartburn   [] Abdominal pain Genitourinary:  [x] Chronic kidney disease   [] Difficult urination  [] Frequent urination  [] Burning with urination   [] Hematuria Skin:  [] Rashes   [x] Ulcers   [] Wounds Psychological:  [] History of anxiety   []  History of major depression.  Physical Examination  BP (!) 156/88 (BP Location: Right Arm, Patient Position: Sitting, Cuff Size: Large)   Pulse 71   Resp 17   Ht 5\' 8"  (1.727 m)   Wt 207 lb (93.9 kg)   BMI 31.47 kg/m  Gen:  WD/WN, NAD Head: [place]/AT, No temporalis wasting. Ear/Nose/Throat: Hearing grossly intact, nares w/o erythema or drainage Eyes: Conjunctiva clear. Sclera non-icteric Neck: Supple.  Trachea midline Pulmonary:  Good air movement, no use of accessory muscles.  Cardiac: RRR, no JVD Vascular: Left arm AV fistula without thrill or bruit.  Open wound  at the antecubital incision site with old hematoma present.  No significant erythema.  No active bleeding. Dermatologic: Left arm wound as above.      Labs Recent Results (from the past 2160 hours)  POCT glycosylated hemoglobin (Hb A1C)     Status: Abnormal   Collection Time: 09/29/23 10:14 AM  Result Value Ref  Range   Hemoglobin A1C 7.4 (A) 4.0 - 5.6 %   HbA1c POC (<> result, manual entry)     HbA1c, POC (prediabetic range)     HbA1c, POC (controlled diabetic range)    Type and screen     Status: None   Collection Time: 11/04/23  9:22 AM  Result Value Ref Range   ABO/RH(D) O POS    Antibody Screen NEG    Sample Expiration 11/18/2023,2359    Extend sample reason      NO TRANSFUSIONS OR PREGNANCY IN THE PAST 3 MONTHS Performed at [facility], [address] [place], [place] [address]   CBC with Differential/Platelet     Status: Abnormal   Collection Time: 11/04/23  9:22 AM  Result Value Ref Range   WBC 9.8 4.0 - 10.5 K/uL   RBC 3.40 (L) 4.22 - 5.81 MIL/uL   Hemoglobin 10.0 (L) 13.0 - 17.0 g/dL   HCT 19.1 (L) 47.8 - 29.5 %   MCV 90.0 80.0 - 100.0 fL   MCH 29.4 26.0 - 34.0 pg   MCHC 32.7 30.0 - 36.0 g/dL   RDW 62.1 30.8 - 65.7 %   Platelets 273 150 - 400 K/uL   nRBC 0.0 0.0 - 0.2 %   Neutrophils Relative % 54 %   Neutro Abs 5.2 1.7 - 7.7 K/uL   Lymphocytes Relative 31 %   Lymphs Abs 3.0 0.7 - 4.0 K/uL   Monocytes Relative 7 %   Monocytes Absolute 0.7 0.1 - 1.0 K/uL   Eosinophils Relative 7 %   Eosinophils Absolute 0.7 (H) 0.0 - 0.5 K/uL   Basophils Relative 1 %   Basophils Absolute 0.1 0.0 - 0.1 K/uL   Immature Granulocytes 0 %   Abs Immature Granulocytes 0.04 0.00 - 0.07 K/uL    Comment: Performed at [facility], [address] [place], [place] [address]  Basic metabolic panel     Status: Abnormal   Collection Time: 11/04/23  9:22 AM  Result Value Ref Range   Sodium 133 (L) 135 - 145 mmol/L   Potassium 5.2 (H) 3.5 - 5.1 mmol/L   Chloride  99 98 - 111 mmol/L   CO2 23 22 - 32 mmol/L   Glucose, Bld 290 (H) 70 - 99 mg/dL    Comment: Glucose reference range applies only to samples taken after fasting for at least 8 hours.   BUN 57 (H) 6 - 20 mg/dL   Creatinine, Ser 2.95 (H) 0.61 - 1.24 mg/dL   Calcium 8.9 8.9 - 28.4 mg/dL   GFR, Estimated 18 (L) >60 mL/min    Comment: (NOTE) Calculated using the CKD-EPI Creatinine Equation (2021)    Anion gap 11 5 - 15    Comment: Performed at [facility], [address], [place], [place] [address]  Surgical pcr screen     Status: None   Collection Time: 11/04/23  9:38 AM   Specimen: Nasal Mucosa; Nasal Swab  Result Value Ref Range   MRSA, PCR NEGATIVE NEGATIVE   Staphylococcus aureus NEGATIVE NEGATIVE    Comment: (NOTE) The Xpert SA Assay (FDA approved for NASAL specimens in patients 28 years of age and older), is one component of a comprehensive surveillance program. It is not intended to diagnose infection nor to guide or monitor treatment. Performed at [facility], [address], [place], [place] [address]   Ferritin     Status: None   Collection Time: 11/16/23  2:09 PM  Result Value Ref Range   Ferritin 118 24 - 336 ng/mL    Comment: Performed at [facility], [address] [place], [place] [address]  Iron  and TIBC     Status: None   Collection Time: 11/16/23  2:09 PM  Result Value Ref Range   Iron  89 45 - 182 ug/dL   TIBC 119 147 - 829 ug/dL   Saturation Ratios 30 17.9 - 39.5 %   UIBC 211 ug/dL    Comment: Performed at [facility] [place], [address] [place], [place] [address]  CMP (Cancer Center only)     Status: Abnormal   Collection Time: 11/16/23  2:09 PM  Result Value Ref Range   Sodium 133 (L) 135 - 145 mmol/L   Potassium 4.7 3.5 - 5.1 mmol/L   Chloride 101 98 - 111 mmol/L   CO2 23 22 - 32 mmol/L   Glucose, Bld 146 (H) 70 - 99 mg/dL    Comment: Glucose reference range applies only to samples taken after fasting for at  least 8 hours.   BUN 52 (H) 6 - 20 mg/dL   Creatinine 0.86 (H) 5.78 - 1.24 mg/dL   Calcium 9.0 8.9 - 46.9 mg/dL   Total Protein 8.8 (H) 6.5 - 8.1 g/dL   Albumin 3.9 3.5 - 5.0 g/dL   AST 33 15 - 41 U/L   ALT 40 0 - 44 U/L   Alkaline Phosphatase 97 38 - 126 U/L   Total Bilirubin 0.5 0.0 - 1.2 mg/dL   GFR, Estimated 17 (L) >60 mL/min    Comment: (NOTE) Calculated using the CKD-EPI Creatinine Equation (2021)    Anion gap 9 5 - 15    Comment: Performed at [facility], [address] [place], [place] [address]  CBC with Differential (Cancer Center Only)     Status: Abnormal   Collection Time: 11/16/23  2:09 PM  Result Value Ref Range   WBC Count 9.6 4.0 - 10.5 K/uL   RBC 3.76 (L) 4.22 - 5.81 MIL/uL   Hemoglobin 10.9 (L) 13.0 - 17.0 g/dL   HCT 84.1 (L) 32.4 - 40.1 %   MCV 90.4 80.0 - 100.0 fL   MCH 29.0 26.0 - 34.0 pg   MCHC 32.1 30.0 - 36.0 g/dL   RDW 02.7 25.3 - 66.4 %   Platelet Count 269 150 - 400 K/uL   nRBC 0.0 0.0 - 0.2 %   Neutrophils Relative % 46 %   Neutro Abs 4.4 1.7 - 7.7 K/uL   Lymphocytes Relative 38 %   Lymphs Abs 3.7 0.7 - 4.0 K/uL   Monocytes Relative 8 %   Monocytes Absolute 0.8 0.1 - 1.0 K/uL   Eosinophils Relative 7 %   Eosinophils Absolute 0.6 (H) 0.0 - 0.5 K/uL   Basophils Relative 1 %   Basophils Absolute 0.1 0.0 - 0.1 K/uL   Immature Granulocytes 0 %   Abs Immature Granulocytes 0.04 0.00 - 0.07 K/uL    Comment: Performed at [facility], [address] [place], [place] [address]  I-STAT, [place] 8     Status: Abnormal   Collection Time: 11/17/23  6:46 AM  Result Value Ref Range   Sodium 134 (L) 135 - 145 mmol/L   Potassium 4.7 3.5 - 5.1 mmol/L   Chloride 103 98 - 111 mmol/L   BUN 49 (H) 6 - 20 mg/dL   Creatinine, Ser 4.25 (H) 0.61 - 1.24 mg/dL   Glucose, Bld 956 (H)  70 - 99 mg/dL    Comment: Glucose reference range applies only to samples taken after fasting for at least 8 hours.   Calcium, Ion 1.06 (L) 1.15 - 1.40 mmol/L   TCO2 21  (L) 22 - 32 mmol/L   Hemoglobin 11.9 (L) 13.0 - 17.0 g/dL   HCT 40.9 (L) 81.1 - 91.4 %  Glucose, capillary     Status: Abnormal   Collection Time: 11/17/23  9:36 AM  Result Value Ref Range   Glucose-Capillary 294 (H) 70 - 99 mg/dL    Comment: Glucose reference range applies only to samples taken after fasting for at least 8 hours.    Radiology No results found.  Assessment/Plan  CKD (chronic kidney disease), stage IV (HCC) Attempts at his left brachiocephalic AV fistula have failed.  The vein was poor.  He has an open wound with some hematoma that needs evacuated and the wound managed.  He will likely need a VAC for several weeks to get the wound healed.  He has multiple issues making his healing poor including low protein levels and his type 1 diabetes with suboptimal control.  No new access attempt will be considered until this wound is completely closed for several weeks.  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  Insulin  dependent type 1 diabetes mellitus (HCC) blood glucose control important in reducing the progression of atherosclerotic disease. Also, involved in wound healing. On appropriate medications.   Essential hypertension, benign blood pressure control important in reducing the progression of atherosclerotic disease. On appropriate oral medications.    Mikki Alexander, MD  12/27/2023 3:43 PM    This note was created with Dragon medical transcription system.  Any errors from dictation are purely unintentional

## 2023-12-27 NOTE — Assessment & Plan Note (Signed)
 blood pressure control important in reducing the progression of atherosclerotic disease. On appropriate oral medications.

## 2023-12-28 ENCOUNTER — Telehealth (INDEPENDENT_AMBULATORY_CARE_PROVIDER_SITE_OTHER): Payer: Self-pay

## 2023-12-28 NOTE — Telephone Encounter (Signed)
 I contacted the patient and he is scheduled with Dr. Vonna Guardian for a wound debridement left arm and wound vac placement with on 01/12/24 at the [facility]. Pre-op  will call to set an appt. Pre-surgical instructions were discussed and will be mailed to patient. Patient was initially scheduled for 01/05/24 and declined wanting to be moved further out.

## 2023-12-29 ENCOUNTER — Ambulatory Visit (INDEPENDENT_AMBULATORY_CARE_PROVIDER_SITE_OTHER): Admitting: Nurse Practitioner

## 2023-12-29 ENCOUNTER — Encounter (INDEPENDENT_AMBULATORY_CARE_PROVIDER_SITE_OTHER)

## 2024-01-05 ENCOUNTER — Other Ambulatory Visit (INDEPENDENT_AMBULATORY_CARE_PROVIDER_SITE_OTHER): Payer: Self-pay | Admitting: Nurse Practitioner

## 2024-01-05 DIAGNOSIS — N184 Chronic kidney disease, stage 4 (severe): Secondary | ICD-10-CM

## 2024-01-06 ENCOUNTER — Other Ambulatory Visit: Payer: Self-pay

## 2024-01-06 ENCOUNTER — Encounter: Payer: Self-pay | Admitting: Internal Medicine

## 2024-01-06 ENCOUNTER — Encounter
Admission: RE | Admit: 2024-01-06 | Discharge: 2024-01-06 | Disposition: A | Discharge status: Home / Self Care | Payer: MEDICAID | Source: Ambulatory Visit | Attending: Vascular Surgery | Admitting: Vascular Surgery

## 2024-01-06 VITALS — BP 111/76 | HR 71 | Resp 18 | Ht 68.0 in

## 2024-01-06 DIAGNOSIS — E1022 Type 1 diabetes mellitus with diabetic chronic kidney disease: Secondary | ICD-10-CM | POA: Insufficient documentation

## 2024-01-06 DIAGNOSIS — Z01812 Encounter for preprocedural laboratory examination: Secondary | ICD-10-CM | POA: Diagnosis present

## 2024-01-06 DIAGNOSIS — E109 Type 1 diabetes mellitus without complications: Secondary | ICD-10-CM

## 2024-01-06 DIAGNOSIS — N184 Chronic kidney disease, stage 4 (severe): Secondary | ICD-10-CM | POA: Diagnosis not present

## 2024-01-06 HISTORY — DX: Personal history of urinary calculi: Z87.442

## 2024-01-06 HISTORY — DX: Other psychoactive substance abuse, uncomplicated: F19.10

## 2024-01-06 HISTORY — DX: Unspecified asthma, uncomplicated: J45.909

## 2024-01-06 LAB — CBC WITH DIFFERENTIAL/PLATELET
Abs Immature Granulocytes: 0.04 10*3/uL (ref 0.00–0.07)
Basophils Absolute: 0.1 10*3/uL (ref 0.0–0.1)
Basophils Relative: 1 %
Eosinophils Absolute: 0.7 10*3/uL — ABNORMAL HIGH (ref 0.0–0.5)
Eosinophils Relative: 7 %
HCT: 30.1 % — ABNORMAL LOW (ref 39.0–52.0)
Hemoglobin: 9.6 g/dL — ABNORMAL LOW (ref 13.0–17.0)
Immature Granulocytes: 0 %
Lymphocytes Relative: 32 %
Lymphs Abs: 3.1 10*3/uL (ref 0.7–4.0)
MCH: 29.1 pg (ref 26.0–34.0)
MCHC: 31.9 g/dL (ref 30.0–36.0)
MCV: 91.2 fL (ref 80.0–100.0)
Monocytes Absolute: 0.7 10*3/uL (ref 0.1–1.0)
Monocytes Relative: 8 %
Neutro Abs: 5.2 10*3/uL (ref 1.7–7.7)
Neutrophils Relative %: 52 %
Platelets: 304 10*3/uL (ref 150–400)
RBC: 3.3 MIL/uL — ABNORMAL LOW (ref 4.22–5.81)
RDW: 13.1 % (ref 11.5–15.5)
WBC: 9.8 10*3/uL (ref 4.0–10.5)
nRBC: 0 % (ref 0.0–0.2)

## 2024-01-06 LAB — BASIC METABOLIC PANEL WITH GFR
Anion gap: 9 (ref 5–15)
BUN: 57 mg/dL — ABNORMAL HIGH (ref 6–20)
CO2: 21 mmol/L — ABNORMAL LOW (ref 22–32)
Calcium: 9.1 mg/dL (ref 8.9–10.3)
Chloride: 103 mmol/L (ref 98–111)
Creatinine, Ser: 4.69 mg/dL — ABNORMAL HIGH (ref 0.61–1.24)
GFR, Estimated: 16 mL/min — ABNORMAL LOW (ref >60)
Glucose, Bld: 208 mg/dL — ABNORMAL HIGH (ref 70–99)
Potassium: 4.6 mmol/L (ref 3.5–5.1)
Sodium: 133 mmol/L — ABNORMAL LOW (ref 135–145)

## 2024-01-06 LAB — TYPE AND SCREEN
ABO/RH(D): O POS
Antibody Screen: NEGATIVE

## 2024-01-06 LAB — SURGICAL PCR SCREEN
MRSA, PCR: NEGATIVE
Staphylococcus aureus: NEGATIVE

## 2024-01-06 NOTE — Patient Instructions (Addendum)
 Your procedure is scheduled on: 01/12/24 - Thursday Report to the Registration Desk on the 1st floor of the [facility]. To find out your arrival time, please call [phone] between 1PM - 3PM on: 01/11/24 - Wednesday If your arrival time is 6:00 am, do not arrive before that time as the [facility] Mall entrance doors do not open until 6:00 am.  REMEMBER: Instructions that are not followed completely may result in serious medical risk, up to and including death; or upon the discretion of your surgeon and anesthesiologist your surgery may need to be rescheduled.  Do not eat food or drink any liquids after midnight the night before surgery.  No gum chewing or hard candies.  One week prior to surgery: Stop Anti-inflammatories (NSAIDS) such as Advil, Aleve, Ibuprofen, Motrin, Naproxen, Naprosyn and Aspirin based products such as Excedrin, Goody's Powder, BC Powder. You may take Tylenol  if needed for pain up until the day of surgery.  Stop ANY OVER THE COUNTER supplements until after surgery.  ON THE DAY OF SURGERY ONLY TAKE THESE MEDICATIONS WITH SIPS OF WATER:  carvedilol (COREG)  cloNIDine (CATAPRES)  methadone  (DOLOPHINE )  pantoprazole  (PROTONIX )  sodium bicarbonate    No Alcohol for 24 hours before or after surgery.  No Smoking including e-cigarettes for 24 hours before surgery.  No chewable tobacco products for at least 6 hours before surgery.  No nicotine  patches on the day of surgery.  Do not use any "recreational" drugs for at least a week (preferably 2 weeks) before your surgery.  Please be advised that the combination of cocaine and anesthesia may have negative outcomes, up to and including death. If you test positive for cocaine, your surgery will be cancelled.  On the morning of surgery brush your teeth with toothpaste and water, you may rinse your mouth with mouthwash if you wish. Do not swallow any toothpaste or mouthwash.  Use CHG Soap or wipes as directed on  instruction sheet.  Do not wear jewelry, make-up, hairpins, clips or nail polish.  For welded (permanent) jewelry: bracelets, anklets, waist bands, etc.  Please have this removed prior to surgery.  If it is not removed, there is a chance that hospital personnel will need to cut it off on the day of surgery.  Do not wear lotions, powders, or perfumes.   Do not shave body hair from the neck down 48 hours before surgery.  Contact lenses, hearing aids and dentures may not be worn into surgery.  Do not bring valuables to the hospital. [facility] is not responsible for any missing/lost belongings or valuables.   Notify your doctor if there is any change in your medical condition (cold, fever, infection).  Wear comfortable clothing (specific to your surgery type) to the hospital.  After surgery, you can help prevent lung complications by doing breathing exercises.  Take deep breaths and cough every 1-2 hours. Your doctor may order a device called an Incentive Spirometer to help you take deep breaths.  When coughing or sneezing, hold a pillow firmly against your incision with both hands. This is called "splinting." Doing this helps protect your incision. It also decreases belly discomfort.  If you are being admitted to the hospital overnight, leave your suitcase in the car. After surgery it may be brought to your room.  In case of increased patient census, it may be necessary for you, the patient, to continue your postoperative care in the Same Day Surgery department.  If you are being discharged the day  of surgery, you will not be allowed to drive home. You will need a responsible individual to drive you home and stay with you for 24 hours after surgery.   If you are taking public transportation, you will need to have a responsible individual with you.  Please call the Pre-admissions Testing Dept. at [phone] if you have any questions about these instructions.  Surgery Visitation  Policy:  Patients having surgery or a procedure may have two visitors.  Children under the age of 97 must have an adult with them who is not the patient.  Inpatient Visitation:    Visiting hours are 7 a.m. to 8 p.m. Up to four visitors are allowed at one time in a patient room. The visitors may rotate out with other people during the day.  One visitor age 5 or older may stay with the patient overnight and must be in the room by 8 p.m.  Preparing the Skin Before Surgery     To help prevent the risk of infection at your surgical site, we are now providing you with rinse-free Sage 2% Chlorhexidine  Gluconate (CHG) disposable wipes.  Chlorhexidine  Gluconate (CHG) Soap  o An antiseptic cleaner that kills germs and bonds with the skin to continue killing germs even after washing  o Used for showering the night before surgery and morning of surgery  The night before surgery: Shower or bathe with warm water. Do not apply perfume, lotions, powders. Wait one hour after shower. Skin should be dry and cool. Open Sage wipe package - use 6 disposable cloths. Wipe body using one cloth for the right arm, one cloth for the left arm, one cloth for the right leg, one cloth for the left leg, one cloth for the chest/abdomen area, and one cloth for the back. Do not use on open wounds or sores. Do not use on face or genitals (private parts). If you are breast feeding, do not use on breasts. 5. Do not rinse, allow to dry. 6. Skin may feel "tacky" for several minutes. 7. Dress in clean clothes. 8. Place clean sheets on your bed and do not sleep with pets.  REPEAT ABOVE ON THE MORNING OF SURGERY BEFORE ARRIVING TO THE HOSPITAL.

## 2024-01-09 ENCOUNTER — Encounter: Payer: Self-pay | Admitting: Internal Medicine

## 2024-01-12 ENCOUNTER — Encounter: Admission: RE | Payer: Self-pay | Source: Home / Self Care

## 2024-01-12 ENCOUNTER — Encounter: Payer: Self-pay | Admitting: Urgent Care

## 2024-01-12 ENCOUNTER — Ambulatory Visit: Admission: RE | Admit: 2024-01-12 | Payer: MEDICAID | Source: Home / Self Care | Admitting: Vascular Surgery

## 2024-01-12 SURGERY — DEBRIDEMENT, WOUND
Anesthesia: General | Laterality: Left

## 2024-01-17 ENCOUNTER — Encounter (INDEPENDENT_AMBULATORY_CARE_PROVIDER_SITE_OTHER): Payer: Self-pay

## 2024-01-31 ENCOUNTER — Ambulatory Visit (INDEPENDENT_AMBULATORY_CARE_PROVIDER_SITE_OTHER): Payer: MEDICAID | Admitting: Vascular Surgery

## 2024-01-31 VITALS — BP 128/77 | HR 75 | Ht 70.0 in | Wt 210.0 lb

## 2024-01-31 DIAGNOSIS — N184 Chronic kidney disease, stage 4 (severe): Secondary | ICD-10-CM

## 2024-01-31 NOTE — Assessment & Plan Note (Signed)
 We attempted a fistula but his superficial veins were extremely poor likely due to his previous intravenous drug use and he had wound issues.  No new access will be planned until he is on dialysis and at that point a graft will likely need to be placed.  Follow-up in 1 month to recheck his wound.  He may need some silver nitrate for the granulation tissue that is a bit heaped up.

## 2024-01-31 NOTE — Progress Notes (Signed)
 Patient ID: Jason Francis, male   DOB: 1991/01/17, 33 y.o.   MRN: [id number]  Chief Complaint  Patient presents with   Wound Check    HPI Jason Francis is a 33 y.o. male.  Patient returns in follow-up of his left arm wound.  This has markedly decreased in size.  The drainage is also decreased.  There is now a less than a centimeter skin separation with good granulation tissue present.  No erythema or significant drainage is present today.   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)     Past Surgical History:  Procedure Laterality Date   APPENDECTOMY     AV FISTULA PLACEMENT Left 11/17/2023   Procedure: ARTERIOVENOUS (AV) FISTULA CREATION;  Surgeon: Celso College, MD;  Location: [facility] ORS;  Service: Vascular;  Laterality: Left;   CYSTOSCOPY, WITH RETROGRADE PYELOGRAM, URETEROSCOPY, URINARY CALCULUS LASER LITHOTRIPSY, AND STENT INSERT     FOOT SURGERY Left 2019   MYRINGOTOMY        Allergies  Allergen Reactions   Augmentin [Amoxicillin-Pot Clavulanate] Nausea And Vomiting   Sulfa Antibiotics Nausea And Vomiting    Current Outpatient Medications  Medication Sig Dispense Refill   acetaminophen  (TYLENOL ) 500 MG tablet Take 1,000 mg by mouth every 6 (six) hours as needed.     amLODipine  (NORVASC ) 10 MG tablet Take 10 mg by mouth every evening.     calcitRIOL (ROCALTROL) 0.25 MCG capsule Take 0.25 mcg by mouth daily.     carvedilol (COREG) 6.25 MG tablet Take 6.25 mg  by mouth 2 (two) times daily with a meal.     cloNIDine (CATAPRES) 0.1 MG tablet Take 0.1 mg by mouth 2 (two) times daily.     Continuous Glucose Sensor (DEXCOM G7 SENSOR) MISC Use one every 10 days for uncontrolled dm 3 each 11   cyclobenzaprine  (FLEXERIL ) 10 MG tablet Take 1 tablet (10 mg total) by mouth 2 (two) times daily as needed for muscle spasms. 60 tablet 3   Ferrous Sulfate (IRON ) 28 MG TABS Take 2 tablets by mouth daily.     insulin  NPH Human (NOVOLIN  N) 100 UNIT/ML injection Inject 0.2 mLs (20 Units total) into the skin 2 (two) times daily before a meal. 20 mL 11   insulin  regular (HUMULIN R ) 100 units/mL injection INJECT INTO SKIN BEFORE MEALS ACCORDING TO SLIDING SCALE - MAX DOSE 48 UNITS IN 24 HOURS 20 mL 6   methadone  (DOLOPHINE ) 1 MG/1ML solution Take 100 mg by mouth daily. Methadone  clinic     metoCLOPramide  (REGLAN ) 10 MG tablet TAKE 1 TABLET BY MOUTH TWICE DAILY AS NEEDED FOR NAUSEA OR VOMITING 180 tablet 0   pantoprazole  (PROTONIX ) 40 MG tablet Take 1 tablet (40 mg total) by mouth daily. 90 tablet 1   sodium bicarbonate 650 MG tablet Take 650 mg by mouth  2 (two) times daily.     sodium zirconium cyclosilicate (LOKELMA) 10 g PACK packet Take 10 g by mouth every other day.     No current facility-administered medications for this visit.        Physical Exam BP 128/77 (BP Location: Right Arm)   Pulse 75   Ht 5\' 10"  (1.778 m)   Wt 210 lb (95.3 kg)   BMI 30.13 kg/m  Gen:  WD/WN, NAD Skin: There is now a less than a centimeter skin separation with good granulation tissue present.  No erythema or significant drainage is present today.     Assessment/Plan:  CKD (chronic kidney disease), stage IV (HCC) We attempted a fistula but his superficial veins were extremely poor likely due to his previous intravenous drug use and he had wound issues.  No new access will be planned until he is on dialysis and at that point a graft will likely need to be placed.  Follow-up in 1  month to recheck his wound.  He may need some silver nitrate for the granulation tissue that is a bit heaped up.      Mikki Alexander 01/31/2024, 4:17 PM   This note was created with Dragon medical transcription system.  Any errors from dictation are unintentional.

## 2024-02-09 ENCOUNTER — Other Ambulatory Visit: Payer: Self-pay | Admitting: Nurse Practitioner

## 2024-02-09 DIAGNOSIS — R1114 Bilious vomiting: Secondary | ICD-10-CM

## 2024-02-09 NOTE — Telephone Encounter (Signed)
 Ok to send

## 2024-02-21 ENCOUNTER — Encounter: Payer: Self-pay | Admitting: Internal Medicine

## 2024-02-28 ENCOUNTER — Ambulatory Visit (INDEPENDENT_AMBULATORY_CARE_PROVIDER_SITE_OTHER): Payer: MEDICAID | Admitting: Nurse Practitioner

## 2024-02-28 ENCOUNTER — Encounter (INDEPENDENT_AMBULATORY_CARE_PROVIDER_SITE_OTHER): Payer: Self-pay | Admitting: Nurse Practitioner

## 2024-02-28 VITALS — BP 131/82 | HR 71 | Resp 18

## 2024-02-28 DIAGNOSIS — N184 Chronic kidney disease, stage 4 (severe): Secondary | ICD-10-CM

## 2024-02-28 DIAGNOSIS — I10 Essential (primary) hypertension: Secondary | ICD-10-CM | POA: Diagnosis not present

## 2024-02-28 NOTE — Progress Notes (Signed)
 Subjective:    Patient ID: Jason Francis, male    DOB: December 07, 1990, 33 y.o.   MRN: [id number] Chief Complaint  Patient presents with   Follow-up    1 month wound check    Jason Francis is a 33 y.o. male.  Patient returns in follow-up of his left arm wound.  This has markedly decreased in size.  The drainage is also decreased.  There is no skin separation just small amount of granulation less than centimeter in size.  No erythema or significant drainage is present today.    Review of Systems  Skin:  Positive for wound.  All other systems reviewed and are negative.      Objective:   Physical Exam Vitals reviewed.  HENT:     Head: Normocephalic.   Cardiovascular:     Rate and Rhythm: Normal rate.     Pulses:          Radial pulses are 1+ on the left side.  Pulmonary:     Effort: Pulmonary effort is normal.   Skin:    General: Skin is warm and dry.   Neurological:     Mental Status: He is alert and oriented to person, place, and time.     Gait: Gait abnormal.   Psychiatric:        Mood and Affect: Mood normal.        Behavior: Behavior normal.        Thought Content: Thought content normal.        Judgment: Judgment normal.     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  Vaping Use   Vaping status: Former  Substance and Sexual Activity   Alcohol use: Not Currently   Drug use: Yes    Types: Marijuana, Cocaine, Heroin    Comment: last stated cocaine use ; marijuana daily   Sexual activity: Yes    Partners: Female  Other Topics Concern   Not on file  Social History Narrative   Not on file   Social Drivers of Health   Financial Resource Strain: Low Risk  (03/01/2018)   Received  from [facility]   Overall Financial Resource Strain (CARDIA)    Difficulty of Paying Living Expenses: Not hard at all  Food Insecurity: No Food Insecurity (01/21/2023)   Hunger Vital Sign    Worried About Running Out of Food in the Last Year: Never true    Ran Out of Food in the Last Year: Never true  Transportation Needs: No Transportation Needs (01/21/2023)   PRAPARE - Administrator, Civil Service (Medical): No    Lack of Transportation (Non-Medical): No  Physical Activity: Inactive (03/01/2018)   Received from [facility]   Exercise Vital Sign    Days of Exercise per Week: 0 days    Minutes of Exercise per Session: 0 min  Stress: Stress Concern Present (03/01/2018)   Received from [facility] of Occupational Health - Occupational Stress Questionnaire    Feeling of Stress : Rather much  Social Connections: Unknown (03/01/2018)   Received from [facility]   Social Connection and Isolation Panel    Frequency of Communication with Friends and Family: Three times a week    Frequency of Social Gatherings with Friends and  Family: Three times a week    Attends Religious Services: Never    Active Member of Clubs or Organizations: No    Attends Banker Meetings: Never    Marital Status: Not on file  Intimate Partner Violence: Not At Risk (01/21/2023)   Humiliation, Afraid, Rape, and Kick questionnaire    Fear of Current or Ex-Partner: No    Emotionally Abused: No    Physically Abused: No    Sexually Abused: No    Past Surgical History:  Procedure Laterality Date   APPENDECTOMY     AV FISTULA PLACEMENT Left 11/17/2023   Procedure: ARTERIOVENOUS (AV) FISTULA CREATION;  Surgeon: Marea Selinda RAMAN, MD;  Location: [facility] ORS;  Service: Vascular;  Laterality: Left;   CYSTOSCOPY, WITH RETROGRADE PYELOGRAM, URETEROSCOPY, URINARY CALCULUS LASER LITHOTRIPSY, AND STENT INSERT     FOOT SURGERY Left 2019   MYRINGOTOMY      Family History  Problem Relation Age of Onset   Heart disease Mother    Hypertension Mother    Obesity Mother    Gout Mother    Thyroid disease Mother    High blood pressure Maternal Grandmother    Dementia Maternal Grandmother    Heart disease Maternal Grandfather     Allergies  Allergen Reactions   Augmentin [Amoxicillin-Pot Clavulanate] Nausea And Vomiting   Sulfa Antibiotics Nausea And Vomiting       Latest Ref Rng & Units 01/06/2024    9:37 AM 11/17/2023    6:46 AM 11/16/2023    2:09 PM  CBC  WBC 4.0 - 10.5 K/uL 9.8   9.6   Hemoglobin 13.0 - 17.0 g/dL 9.6  88.0  89.0   Hematocrit 39.0 - 52.0 % 30.1  35.0  34.0   Platelets 150 - 400 K/uL 304   269       CMP     Component Value Date/Time   NA 133 (L) 01/06/2024 0937   K 4.6 01/06/2024 0937   CL 103 01/06/2024 0937   CO2 21 (L) 01/06/2024 0937   GLUCOSE 208 (H) 01/06/2024 0937   BUN 57 (H) 01/06/2024 0937   CREATININE 4.69 (H) 01/06/2024 0937   CREATININE 4.38 (H) 11/16/2023 1409   CALCIUM 9.1 01/06/2024 0937   PROT 8.8 (H) 11/16/2023  1409   ALBUMIN 3.9 11/16/2023 1409   AST 33 11/16/2023 1409   ALT 40  11/16/2023 1409   ALKPHOS 97 11/16/2023 1409   BILITOT 0.5 11/16/2023 1409   GFRNONAA 16 (L) 01/06/2024 0937   GFRNONAA 17 (L) 11/16/2023 1409     No results found.     Assessment & Plan:   1. CKD (chronic kidney disease), stage IV (HCC) (Primary) The wound is very nearly healed he has a small area less than 1 cm that is open.  I recommend use of hydrocolloid bandage as this is very easy to utilize and will hopefully spare wound healing soon.  I have discussed and reiterated with the patient that given his previous issues he will need a graft for dialysis.  Based on this we will not plan on insertion until we are certain the patient will need to begin with dialysis.  Will have him return in 1 month for wound evaluation  2. Essential hypertension, benign Continue antihypertensive medications as already ordered, these medications have been reviewed and there are no changes at this time.   Current Outpatient Medications on File Prior to Visit  Medication Sig Dispense Refill   acetaminophen  (TYLENOL ) 500 MG tablet Take 1,000 mg by mouth every 6 (six) hours as needed.     amLODipine  (NORVASC ) 10 MG tablet Take 10 mg by mouth every evening.     calcitRIOL (ROCALTROL) 0.25 MCG capsule Take 0.25 mcg by mouth daily.     carvedilol (COREG) 6.25 MG tablet Take 6.25 mg by mouth 2 (two) times daily with a meal.     cloNIDine (CATAPRES) 0.1 MG tablet Take 0.1 mg by mouth 2 (two) times daily.     Continuous Glucose Sensor (DEXCOM G7 SENSOR) MISC Use one every 10 days for uncontrolled dm 3 each 11   cyclobenzaprine  (FLEXERIL ) 10 MG tablet Take 1 tablet (10 mg total) by mouth 2 (two) times daily as needed for muscle spasms. 60 tablet 3   Ferrous Sulfate (IRON ) 28 MG TABS Take 2 tablets by mouth daily.     insulin  NPH Human (NOVOLIN  N) 100 UNIT/ML injection Inject 0.2 mLs (20 Units total) into the skin 2 (two) times daily before a meal. 20 mL 11   insulin  regular (HUMULIN R ) 100 units/mL injection  INJECT INTO SKIN BEFORE MEALS ACCORDING TO SLIDING SCALE - MAX DOSE 48 UNITS IN 24 HOURS 20 mL 6   methadone  (DOLOPHINE ) 1 MG/1ML solution Take 100 mg by mouth daily. Methadone  clinic     metoCLOPramide  (REGLAN ) 10 MG tablet TAKE 1 TABLET BY MOUTH TWICE DAILY AS NEEDED FOR NAUSEA OR VOMITING 180 tablet 0   pantoprazole  (PROTONIX ) 40 MG tablet Take 1 tablet (40 mg total) by mouth daily. 90 tablet 1   sodium bicarbonate 650 MG tablet Take 650 mg by mouth 2 (two) times daily.     sodium zirconium cyclosilicate (LOKELMA) 10 g PACK packet Take 10 g by mouth every other day.     No current facility-administered medications on file prior to visit.    There are no Patient Instructions on file for this visit. No follow-ups on file.   Raaga Maeder E Liya Strollo, NP

## 2024-03-01 ENCOUNTER — Ambulatory Visit (INDEPENDENT_AMBULATORY_CARE_PROVIDER_SITE_OTHER): Admitting: Nurse Practitioner

## 2024-03-09 ENCOUNTER — Encounter: Payer: Self-pay | Admitting: Nurse Practitioner

## 2024-03-09 ENCOUNTER — Ambulatory Visit: Payer: MEDICAID | Admitting: Nurse Practitioner

## 2024-03-09 VITALS — BP 136/80 | HR 70 | Temp 97.9°F | Resp 16 | Ht 70.0 in | Wt 215.2 lb

## 2024-03-09 DIAGNOSIS — N184 Chronic kidney disease, stage 4 (severe): Secondary | ICD-10-CM

## 2024-03-09 DIAGNOSIS — E1159 Type 2 diabetes mellitus with other circulatory complications: Secondary | ICD-10-CM

## 2024-03-09 DIAGNOSIS — E1022 Type 1 diabetes mellitus with diabetic chronic kidney disease: Secondary | ICD-10-CM | POA: Diagnosis not present

## 2024-03-09 DIAGNOSIS — M86372 Chronic multifocal osteomyelitis, left ankle and foot: Secondary | ICD-10-CM

## 2024-03-09 DIAGNOSIS — I152 Hypertension secondary to endocrine disorders: Secondary | ICD-10-CM

## 2024-03-09 DIAGNOSIS — K219 Gastro-esophageal reflux disease without esophagitis: Secondary | ICD-10-CM

## 2024-03-09 LAB — POCT GLYCOSYLATED HEMOGLOBIN (HGB A1C): Hemoglobin A1C: 7.5 % — AB (ref 4.0–5.6)

## 2024-03-09 MED ORDER — ACCU-CHEK GUIDE TEST VI STRP: ORAL_STRIP | 12 refills | Status: AC

## 2024-03-09 MED ORDER — ACCU-CHEK SOFTCLIX LANCETS MISC
12 refills | Status: AC
Start: 2024-03-09 — End: ?

## 2024-03-09 MED ORDER — PANTOPRAZOLE SODIUM 40 MG PO TBEC
40.0000 mg | DELAYED_RELEASE_TABLET | Freq: Every day | ORAL | 1 refills | Status: DC
Start: 2024-03-09 — End: 2024-07-13

## 2024-03-09 MED ORDER — ACCU-CHEK GUIDE ME W/DEVICE KIT
PACK | 0 refills | Status: DC
Start: 2024-03-09 — End: 2024-04-02

## 2024-03-09 NOTE — Progress Notes (Signed)
 [facility] [address] [place], [place] [address]  Internal MEDICINE  Office Visit Note  Patient Name: Jason Francis  [id number]  [id number]  Date of Service: 03/09/2024  Chief Complaint  Patient presents with   Diabetes   Gastroesophageal Reflux   Hypertension   Follow-up    HPI Jason Francis presents for a follow-up visit for diabetes, charcot ankle, CKD, hypertension and GERD.  Diabetes -- A1c is still elevated at 7.5. on insulin .  CKD -- sees nephrology. AV fistula failed. Possibly getting a graft for dialysis access.   Charcot ankle -- waiting for surgery to be scheduled for amputation.  Hypertension -- controlled on current medications  GERD -- controlled with pantoprazole .     Current Medication: Outpatient Encounter Medications as of 03/09/2024  Medication Sig   Accu-Chek Softclix Lancets lancets Use 1 lancet to check glucose 4 times daily and as needed for diabetes E11.65   Blood Glucose Monitoring Suppl (ACCU-CHEK GUIDE ME) w/Device KIT Use as directed ( e11.65)   glucose blood (ACCU-CHEK GUIDE TEST) test strip Use 1 test strip to check glucose 4 times daily and as needed for diabetes E11.65   acetaminophen  (TYLENOL ) 500 MG tablet Take 1,000 mg by mouth every 6 (six) hours as needed.   amLODipine  (NORVASC ) 10 MG tablet Take 10 mg by mouth every evening.   calcitRIOL (ROCALTROL) 0.25 MCG capsule Take 0.25 mcg by mouth daily.   carvedilol (COREG) 6.25 MG tablet Take 6.25 mg by mouth 2 (two) times daily with a meal.   cloNIDine (CATAPRES) 0.1 MG tablet Take 0.1 mg by mouth 2 (two) times daily.   cyclobenzaprine  (FLEXERIL ) 10 MG tablet Take 1 tablet (10 mg total) by mouth 2 (two) times daily as needed for muscle spasms.   Ferrous Sulfate (IRON ) 28 MG TABS Take 2 tablets by mouth daily.   insulin  NPH Human (NOVOLIN  N) 100 UNIT/ML injection Inject 0.2 mLs (20 Units total) into the skin 2 (two) times daily before a meal.   insulin  regular (HUMULIN R ) 100 units/mL  injection INJECT INTO SKIN BEFORE MEALS ACCORDING TO SLIDING SCALE - MAX DOSE 48 UNITS IN 24 HOURS   methadone  (DOLOPHINE ) 1 MG/1ML solution Take 100 mg by mouth daily. Methadone  clinic   metoCLOPramide  (REGLAN ) 10 MG tablet TAKE 1 TABLET BY MOUTH TWICE DAILY AS NEEDED FOR NAUSEA OR VOMITING   pantoprazole  (PROTONIX ) 40 MG tablet Take 1 tablet (40 mg total) by mouth daily.   sodium bicarbonate 650 MG tablet Take 650 mg by mouth 2 (two) times daily.   sodium zirconium cyclosilicate (LOKELMA) 10 g PACK packet Take 10 g by mouth every other day.   [DISCONTINUED] Continuous Glucose Sensor (DEXCOM G7 SENSOR) MISC Use one every 10 days for uncontrolled dm   [DISCONTINUED] pantoprazole  (PROTONIX ) 40 MG tablet Take 1 tablet (40 mg total) by mouth daily.   No facility-administered encounter medications on file as of 03/09/2024.    Surgical History: Past Surgical History:  Procedure Laterality Date   APPENDECTOMY     AV FISTULA PLACEMENT Left 11/17/2023   Procedure: ARTERIOVENOUS (AV) FISTULA CREATION;  Surgeon: Marea Selinda RAMAN, MD;  Location: [facility] ORS;  Service: Vascular;  Laterality: Left;   CYSTOSCOPY, WITH RETROGRADE PYELOGRAM, URETEROSCOPY, URINARY CALCULUS LASER LITHOTRIPSY, AND STENT INSERT     FOOT SURGERY Left 2019   MYRINGOTOMY      Medical History: Past Medical History:  Diagnosis Date   Anemia    Asthma    Chronic multifocal osteomyelitis of left  ankle (HCC)    CKD (chronic kidney disease), stage IV (HCC)    Diabetic Charcot foot (HCC)    GERD (gastroesophageal reflux disease)    Hepatitis C    History of kidney stones    Hypertension    MRSA infection    in blood per patient   Nausea & vomiting 02/14/2022   Polysubstance abuse (HCC)    a.) THC + cocaine + TCA + IVDU   Secondary hyperparathyroidism (HCC)    Substance abuse (HCC)    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00    Average packs/day: 1 pack/day for 21.5 years (21.5 ttl pk-yrs)    Types: Cigarettes    Start date: 08/30/2002   Smokeless tobacco: Never   Tobacco comments:    1/2 pack daily.  Vaping Use   Vaping status: Former  Substance and Sexual Activity   Alcohol use: Not Currently   Drug use: Yes    Types: Marijuana, Cocaine, Heroin    Comment: last stated cocaine use ; marijuana daily   Sexual activity: Yes    Partners: Female  Other Topics Concern   Not on file  Social History Narrative   Not on file   Social Drivers of Health   Financial Resource Strain: Low Risk  (03/01/2018)   Received from [facility]   Overall Financial Resource Strain (CARDIA)    Difficulty of Paying Living Expenses: Not hard at all  Food Insecurity: No Food Insecurity (01/21/2023)   Hunger Vital Sign    Worried About Running Out of Food in the Last Year: Never true    Ran Out of Food in the Last Year: Never true  Transportation Needs: No Transportation Needs (01/21/2023)   PRAPARE - Administrator, Civil Service (Medical): No    Lack of Transportation (Non-Medical): No  Physical Activity: Inactive (03/01/2018)   Received from [facility]   Exercise Vital Sign    Days of Exercise per Week: 0 days    Minutes of Exercise per Session: 0 min  Stress: Stress Concern Present (03/01/2018)   Received from [facility] of Occupational Health - Occupational Stress Questionnaire    Feeling of Stress : Rather much  Social Connections: Unknown (03/01/2018)   Received from [facility]   Social Connection and Isolation Panel    Frequency of Communication with Friends and Family: Three times a week    Frequency of Social Gatherings with Friends and Family: Three times a week    Attends Religious Services: Never    Active Member of Clubs or Organizations: No    Attends Banker Meetings: Never  Marital Status: Not on file  Intimate Partner Violence: Not At Risk (01/21/2023)   Humiliation, Afraid, Rape, and Kick questionnaire    Fear of Current or Ex-Partner: No    Emotionally Abused: No    Physically Abused: No    Sexually Abused: No      Review of Systems  Constitutional:  Negative for chills, fatigue and unexpected weight change.  HENT:  Positive for dental problem and postnasal drip. Negative for congestion, rhinorrhea, sneezing and sore throat.   Eyes:  Negative for redness.  Respiratory: Negative.  Negative for cough, chest tightness, shortness of breath and wheezing.   Cardiovascular: Negative.  Negative for chest pain and palpitations.  Gastrointestinal:  Negative for abdominal pain, constipation, diarrhea, nausea and vomiting.  Genitourinary:  Negative for dysuria and frequency.  Musculoskeletal:  Positive for arthralgias (Left neck pain), gait problem (Due to Charcot's joint of the left ankle, ambulates with crutches) and neck pain (Left side). Negative for back pain and joint swelling.  Skin: Negative.  Negative for rash.  Neurological:  Negative for tremors and numbness.  Hematological:  Negative for adenopathy. Does not bruise/bleed easily.  Psychiatric/Behavioral:  Negative for behavioral problems (Depression), sleep disturbance and suicidal ideas. The patient is not nervous/anxious.     Vital Signs: BP 136/80   Pulse 70   Temp 97.9 F (36.6 C)    Resp 16   Ht 5' 10 (1.778 m)   Wt 215 lb 3.2 oz (97.6 kg)   SpO2 99%   BMI 30.88 kg/m    Physical Exam Vitals reviewed.  Constitutional:      General: He is not in acute distress.    Appearance: Normal appearance. He is not ill-appearing.  HENT:     Head: Normocephalic and atraumatic.     Mouth/Throat:     Dentition: Abnormal dentition. Dental tenderness, gingival swelling and dental caries present.  Eyes:     Pupils: Pupils are equal, round, and reactive to light.  Cardiovascular:     Rate and Rhythm: Normal rate and regular rhythm.  Pulmonary:     Effort: Pulmonary effort is normal. No respiratory distress.  Neurological:     Mental Status: He is alert and oriented to person, place, and time.     Gait: Gait abnormal (Due to Charcot's joint of the left ankle).  Psychiatric:        Mood and Affect: Mood normal.        Behavior: Behavior normal.        Assessment/Plan: 1. 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 Declined endo referral. Needs new glucose meter. Continue insulin  as prescribed.  - POCT glycosylated hemoglobin (Hb A1C) - glucose blood (ACCU-CHEK GUIDE TEST) test strip; Use 1 test strip to check glucose 4 times daily and as needed for diabetes E11.65  Dispense: 200 each; Refill: 12 - Accu-Chek Softclix Lancets lancets; Use 1 lancet to check glucose 4 times daily and as needed for diabetes E11.65  Dispense: 200 each; Refill: 12  2. CKD (chronic kidney disease), stage IV (HCC) Follow up with nephrology, continue medications as prescribed. Waiting for graft to eventually start dialysis   3. Hypertension associated with diabetes (HCC) Continue medications as prescribed.   4. 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   5. Gastroesophageal reflux disease without esophagitis Continue pantoprazole  as prescribed.  - pantoprazole  (PROTONIX ) 40 MG  tablet; Take 1 tablet (40 mg  total) by mouth daily.  Dispense: 90 tablet; Refill: 1   General Counseling: Jason Francis understanding of the findings of todays visit and agrees with plan of treatment. I have discussed any further diagnostic evaluation that may be needed or ordered today. We also reviewed his medications today. he has been encouraged to call the office with any questions or concerns that should arise related to todays visit.    Orders Placed This Encounter  Procedures   POCT glycosylated hemoglobin (Hb A1C)    Meds ordered this encounter  Medications   Blood Glucose Monitoring Suppl (ACCU-CHEK GUIDE ME) w/Device KIT    Sig: Use as directed ( e11.65)    Dispense:  1 kit    Refill:  0    Dx code E11.65, fill new script   glucose blood (ACCU-CHEK GUIDE TEST) test strip    Sig: Use 1 test strip to check glucose 4 times daily and as needed for diabetes E11.65    Dispense:  200 each    Refill:  12    Dx code E11.65, fill new script today   Accu-Chek Softclix Lancets lancets    Sig: Use 1 lancet to check glucose 4 times daily and as needed for diabetes E11.65    Dispense:  200 each    Refill:  12    Dx code E11.65, fill new script today   pantoprazole  (PROTONIX ) 40 MG tablet    Sig: Take 1 tablet (40 mg total) by mouth daily.    Dispense:  90 tablet    Refill:  1    Return in about 4 months (around 07/10/2024) for F/U, Recheck A1C, Jason Francis PCP.   Total time spent:30 Minutes Time spent includes review of chart, medications, test results, and follow up plan with the patient.   [place] Controlled Substance Database was reviewed by me.  This patient was seen by Mardy Maxin, FNP-C in collaboration with Dr. Sigrid Bathe as a part of collaborative care agreement.   Jannelle Notaro R. Maxin, MSN, FNP-C Internal medicine

## 2024-03-19 ENCOUNTER — Inpatient Hospital Stay: Payer: MEDICAID | Admitting: Internal Medicine

## 2024-03-19 ENCOUNTER — Inpatient Hospital Stay: Payer: MEDICAID

## 2024-03-19 ENCOUNTER — Inpatient Hospital Stay: Payer: MEDICAID | Attending: Nurse Practitioner

## 2024-03-30 ENCOUNTER — Ambulatory Visit (INDEPENDENT_AMBULATORY_CARE_PROVIDER_SITE_OTHER): Payer: MEDICAID | Admitting: Vascular Surgery

## 2024-04-02 ENCOUNTER — Other Ambulatory Visit: Payer: Self-pay | Admitting: Nurse Practitioner

## 2024-04-08 ENCOUNTER — Encounter: Payer: Self-pay | Admitting: Nurse Practitioner

## 2024-04-08 DIAGNOSIS — I10 Essential (primary) hypertension: Secondary | ICD-10-CM | POA: Insufficient documentation

## 2024-04-08 DIAGNOSIS — E1159 Type 2 diabetes mellitus with other circulatory complications: Secondary | ICD-10-CM | POA: Insufficient documentation

## 2024-04-17 ENCOUNTER — Encounter (INDEPENDENT_AMBULATORY_CARE_PROVIDER_SITE_OTHER): Payer: Self-pay | Admitting: Vascular Surgery

## 2024-04-17 ENCOUNTER — Ambulatory Visit (INDEPENDENT_AMBULATORY_CARE_PROVIDER_SITE_OTHER): Payer: MEDICAID | Admitting: Vascular Surgery

## 2024-04-17 VITALS — BP 137/82 | HR 65 | Ht 70.0 in | Wt 210.0 lb

## 2024-04-17 DIAGNOSIS — E1159 Type 2 diabetes mellitus with other circulatory complications: Secondary | ICD-10-CM

## 2024-04-17 DIAGNOSIS — L089 Local infection of the skin and subcutaneous tissue, unspecified: Secondary | ICD-10-CM | POA: Diagnosis not present

## 2024-04-17 DIAGNOSIS — I152 Hypertension secondary to endocrine disorders: Secondary | ICD-10-CM

## 2024-04-17 DIAGNOSIS — E10628 Type 1 diabetes mellitus with other skin complications: Secondary | ICD-10-CM

## 2024-04-17 DIAGNOSIS — N184 Chronic kidney disease, stage 4 (severe): Secondary | ICD-10-CM | POA: Diagnosis not present

## 2024-04-17 DIAGNOSIS — M86372 Chronic multifocal osteomyelitis, left ankle and foot: Secondary | ICD-10-CM

## 2024-04-17 NOTE — Assessment & Plan Note (Signed)
 blood glucose control important in reducing the progression of atherosclerotic disease. Also, involved in wound healing. On appropriate medications.

## 2024-04-17 NOTE — Progress Notes (Signed)
 MRN : [id number]  Jason Francis is a 33 y.o. (March 10, 1991) male who presents with chief complaint of  Chief Complaint  Patient presents with   Wound Check    fu 1 month wound check see JD  .  History of Present Illness: Patient returns today in follow up of his right arm wound as well as his chronic ulceration and Charcot foot on the left.  He has not yet started dialysis.  His failed AV fistula in the left upper extremity has almost completely healed.  He has a subcentimeter circular scab in the midportion of the incision.  His mom says there is still some depth to it if the scab comes off, but there is no erythema or sign of infection.  There is been no drainage.  It is no longer painful. As for his foot, he is still considering left below-knee amputation.  We have had this on the schedule in the past and he has been trying to sort through his renal issues which is reasonable.  He and his mother had several questions regarding the timing and performance of that today.  Current Outpatient Medications  Medication Sig Dispense Refill   Accu-Chek Softclix Lancets lancets Use 1 lancet to check glucose 4 times daily and as needed for diabetes E11.65 200 each 12   acetaminophen  (TYLENOL ) 500 MG tablet Take 1,000 mg by mouth every 6 (six) hours as needed.     amLODipine  (NORVASC ) 10 MG tablet Take 10 mg by mouth every evening.     Blood Glucose Monitoring Suppl (ACCU-CHEK GUIDE ME) w/Device KIT USE AS DIRECTED 1 kit 0   calcitRIOL (ROCALTROL) 0.25 MCG capsule Take 0.25 mcg by mouth daily.     carvedilol (COREG) 6.25 MG tablet Take 6.25 mg by mouth 2 (two) times daily with a meal.     cloNIDine (CATAPRES) 0.1 MG tablet Take 0.1 mg by mouth 2 (two) times daily.     cyclobenzaprine  (FLEXERIL ) 10 MG tablet Take 1 tablet (10 mg total) by mouth 2 (two) times daily as needed for muscle spasms. 60 tablet 3   Ferrous Sulfate (IRON ) 28 MG TABS Take 2 tablets by mouth daily.     glucose blood (ACCU-CHEK  GUIDE TEST) test strip Use 1 test strip to check glucose 4 times daily and as needed for diabetes E11.65 200 each 12   insulin  NPH Human (NOVOLIN  N) 100 UNIT/ML injection Inject 0.2 mLs (20 Units total) into the skin 2 (two) times daily before a meal. 20 mL 11   insulin  regular (HUMULIN R ) 100 units/mL injection INJECT INTO SKIN BEFORE MEALS ACCORDING TO SLIDING SCALE - MAX DOSE 48 UNITS IN 24 HOURS 20 mL 6   methadone  (DOLOPHINE ) 1 MG/1ML solution Take 100 mg by mouth daily. Methadone  clinic     metoCLOPramide  (REGLAN ) 10 MG tablet TAKE 1 TABLET BY MOUTH TWICE DAILY AS NEEDED FOR NAUSEA OR VOMITING 180 tablet 0   pantoprazole  (PROTONIX ) 40 MG tablet Take 1 tablet (40 mg total) by mouth daily. 90 tablet 1   sodium bicarbonate 650 MG tablet Take 650 mg by mouth 2 (two) times daily.     sodium zirconium cyclosilicate (LOKELMA) 10 g PACK packet Take 10 g by mouth every other day.     No current facility-administered medications for this visit.    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)     Past Surgical History:  Procedure Laterality Date   APPENDECTOMY     AV FISTULA PLACEMENT Left 11/17/2023   Procedure: ARTERIOVENOUS (AV) FISTULA CREATION;  Surgeon: Marea Selinda RAMAN, MD;  Location: [facility] OR[facility];  Service: Vascular;  Laterality: Left;   CYSTOSCOPY, WITH RETROGRADE PYELOGRAM, URETEROSCOPY, URINARY CALCULUS LASER LITHOTRIPSY, AND STENT INSERT     FOOT SURGERY Left 2019   MYRINGOTOMY        Social History   Tobacco Use   Smoking status: Every Day    Current packs/day: 1.00    Average packs/day: 1 pack/day for 21.6 years (21.6 ttl pk-yrs)    Types: Cigarettes    Start date: 08/30/2002   Smokeless tobacco: Never   Tobacco comments:    1/2 pack daily.  Vaping Use   Vaping status: Former  Substance Use Topics   Alcohol use: Not Currently   Drug use: Yes    Types: Marijuana, Cocaine, Heroin    Comment: last stated cocaine use ; marijuana daily       Family History  Problem Relation Age of Onset   Heart disease Mother    Hypertension Mother    Obesity Mother    Gout Mother    Thyroid disease Mother    High blood pressure Maternal Grandmother    Dementia Maternal Grandmother    Heart disease Maternal Grandfather      Allergies  Allergen Reactions   Augmentin [Amoxicillin-Pot Clavulanate] Nausea And Vomiting   Sulfa Antibiotics Nausea And Vomiting     REVIEW OF SYSTEMS (Negative unless checked)  Constitutional: [] Weight loss  [] Fever  [] Chills Cardiac: [] Chest pain   [] Chest pressure   [] Palpitations   [] Shortness of breath when laying flat   [] Shortness of breath at rest   [] Shortness of breath with exertion. Vascular:  [] Pain in legs with walking   [] Pain in legs at rest   [] Pain in legs when laying flat   [] Claudication   [] Pain in feet when walking  [] Pain in feet at rest  [] Pain in feet when laying flat   [] History of DVT   [] Phlebitis   [] Swelling in legs   [] Varicose veins   [x] Non-healing ulcers Pulmonary:   [] Uses home oxygen   [] Productive cough   [] Hemoptysis   [] Wheeze  [] COPD   [] Asthma Neurologic:  [] Dizziness  [] Blackouts   [] Seizures   [] History of stroke   [] History of TIA  [] Aphasia   [] Temporary blindness   [] Dysphagia   [] Weakness or numbness in arms   [] Weakness or numbness in legs Musculoskeletal:  [] Arthritis   [] Joint swelling   [x] Joint pain   [] Low back pain Hematologic:  [] Easy bruising  [] Easy bleeding   [] Hypercoagulable state    [x] Anemic   Gastrointestinal:  [] Blood in stool   [] Vomiting blood  [] Gastroesophageal reflux/heartburn   [] Abdominal pain Genitourinary:  [x] Chronic kidney disease   [] Difficult urination  [] Frequent urination  [] Burning with urination   [] Hematuria  Skin:  [] Rashes   [x] Ulcers   [x] Wounds Psychological:  [] History of anxiety   []  History of major depression.  Physical Examination  BP 137/82   Pulse 65   Ht 5' 10 (1.778 m)   Wt 210 lb (95.3 kg)   BMI 30.13 kg/m  Gen:  WD/WN, NAD.  Appears older than stated age Head: [place]/AT, No temporalis wasting. Ear/Nose/Throat: Hearing grossly intact, nares w/o erythema or drainage.  Extremely poor dentition Eyes: Conjunctiva clear. Sclera non-icteric Neck: Supple.  Trachea midline Pulmonary:  Good air movement, no use of accessory muscles.  Cardiac: RRR, no JVD Vascular:  Vessel Right Left  Radial Palpable Palpable               Musculoskeletal: M/S 5/5 throughout.  No deformity or atrophy.  Left upper arm AV fistula incision with subcentimeter circular scab in the midportion.  Left foot with marked Charcot changes.  1-2+ bilateral lower extremity edema. Neurologic: Sensation grossly intact in extremities.  Symmetrical.  Speech is fluent.  Psychiatric: Judgment intact, Mood & affect appropriate for pt's clinical situation. Dermatologic: Left arm incision wound as above.      Labs Recent Results (from the past 2160 hours)  POCT glycosylated hemoglobin (Hb A1C)     Status: Abnormal   Collection Time: 03/09/24  9:44 AM  Result Value Ref Range   Hemoglobin A1C 7.5 (A) 4.0 - 5.6 %   HbA1c POC (<> result, manual entry)     HbA1c, POC (prediabetic range)     HbA1c, POC (controlled diabetic range)      Radiology No results found.  Assessment/Plan  CKD (chronic kidney disease), stage IV (HCC) Failed left arm AV fistula.  Wound as above.  Would put Neosporin or bacitracin on the scab and this should be healed in the next month or 2.   Return in about 6 weeks to recheck this. No plans for an access placement at this time.  Since he is likely to need a graft going forward, we wait until he is about to start dialysis and place it at that time.  He may need a catheter for a short period of time.  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  We have actually had him on the schedule for this for many months but he was trying to get his renal issues taken care of first.  We discussed the pros and cons of doing this before or after he starts dialysis.  He wants to go home and talk with his family and decide if he wants to schedule this.  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 Also, involved in wound healing. On appropriate medications.   Hypertension associated with diabetes (HCC) blood pressure control important in reducing the progression of atherosclerotic disease. On appropriate oral medications.    Selinda Gu, MD  04/17/2024 4:56 PM    This note was created with Dragon medical transcription system.  Any errors from dictation are purely unintentional

## 2024-04-17 NOTE — Assessment & Plan Note (Signed)
 Failed left arm AV fistula.  Wound as above.  Would put Neosporin or bacitracin on the scab and this should be healed in the next month or 2.  Return in about 6 weeks to recheck this. No plans for an access placement at this time.  Since he is likely to need a graft going forward, we wait until he is about to start dialysis and place it at that time.  He may need a catheter for a short period of time.

## 2024-04-17 NOTE — Assessment & Plan Note (Signed)
 blood pressure control important in reducing the progression of atherosclerotic disease. On appropriate oral medications.

## 2024-04-17 NOTE — Assessment & Plan Note (Signed)
 Needs a left below-knee amputation.  We have actually had him on the schedule for this for many months but he was trying to get his renal issues taken care of first.  We discussed the pros and cons of doing this before or after he starts dialysis.  He wants to go home and talk with his family and decide if he wants to schedule this.

## 2024-05-29 ENCOUNTER — Ambulatory Visit (INDEPENDENT_AMBULATORY_CARE_PROVIDER_SITE_OTHER): Payer: MEDICAID | Admitting: Vascular Surgery

## 2024-05-29 VITALS — BP 134/81 | HR 81

## 2024-05-29 DIAGNOSIS — M86372 Chronic multifocal osteomyelitis, left ankle and foot: Secondary | ICD-10-CM | POA: Diagnosis not present

## 2024-05-29 DIAGNOSIS — E1159 Type 2 diabetes mellitus with other circulatory complications: Secondary | ICD-10-CM

## 2024-05-29 DIAGNOSIS — N184 Chronic kidney disease, stage 4 (severe): Secondary | ICD-10-CM | POA: Diagnosis not present

## 2024-05-29 DIAGNOSIS — L089 Local infection of the skin and subcutaneous tissue, unspecified: Secondary | ICD-10-CM

## 2024-05-29 DIAGNOSIS — I152 Hypertension secondary to endocrine disorders: Secondary | ICD-10-CM

## 2024-05-29 NOTE — Assessment & Plan Note (Signed)
 blood pressure control important in reducing the progression of atherosclerotic disease. On appropriate oral medications.

## 2024-05-29 NOTE — Assessment & Plan Note (Signed)
 blood glucose control important in reducing the progression of atherosclerotic disease. Also, involved in wound healing. On appropriate medications.

## 2024-05-29 NOTE — Assessment & Plan Note (Signed)
 No usable superficial vein.  We will try to time graft placement over about a month or so before he starts dialysis to allow this to heal and avoid catheter if at all possible.  I will try to coordinate this with his nephrologist who is managing his renal issues.

## 2024-05-29 NOTE — Progress Notes (Signed)
 MRN : [id number]  Jason Francis is a 33 y.o. (1991/06/21) male who presents with chief complaint of  Chief Complaint  Patient presents with   Follow-up  .  History of Present Illness: Patient returns today in follow up of both his arm incision from his previous AV fistula as well as his left ankle osteomyelitis.  He was scheduled to have an amputation almost a year ago.  He has no current open wounds.  No fever or chills.  He is still considering this.  It is unclear if he has active infection and that would certainly be concerning for placement of an AV graft or kidney transplant.  He has not had any imaging or evaluation of this in many months. As for his arm, his incision has healed.  We attempted to place an AV fistula as he has CKD stage IV but not end-stage renal disease.  This did not mature.  He does not have much in the way of usable superficial veins after having previous IV drug use.  Current Outpatient Medications  Medication Sig Dispense Refill   Accu-Chek Softclix Lancets lancets Use 1 lancet to check glucose 4 times daily and as needed for diabetes E11.65 200 each 12   acetaminophen  (TYLENOL ) 500 MG tablet Take 1,000 mg by mouth every 6 (six) hours as needed.     amLODipine  (NORVASC ) 10 MG tablet Take 10 mg by mouth every evening.     Blood Glucose Monitoring Suppl (ACCU-CHEK GUIDE ME) w/Device KIT USE AS DIRECTED 1 kit 0   calcitRIOL (ROCALTROL) 0.25 MCG capsule Take 0.25 mcg by mouth daily.     carvedilol (COREG) 6.25 MG tablet Take 6.25 mg by mouth 2 (two) times daily with a meal.     cloNIDine (CATAPRES) 0.1 MG tablet Take 0.1 mg by mouth 2 (two) times daily.     cyclobenzaprine  (FLEXERIL ) 10 MG tablet Take 1 tablet (10 mg total) by mouth 2 (two) times daily as needed for muscle spasms. 60 tablet 3   Ferrous Sulfate (IRON ) 28 MG TABS Take 2 tablets by mouth daily.     glucose blood (ACCU-CHEK GUIDE TEST) test strip Use 1 test strip to check glucose 4 times daily and as  needed for diabetes E11.65 200 each 12   insulin  NPH Human (NOVOLIN  N) 100 UNIT/ML injection Inject 0.2 mLs (20 Units total) into the skin 2 (two) times daily before a meal. 20 mL 11   insulin  regular (HUMULIN R ) 100 units/mL injection INJECT INTO SKIN BEFORE MEALS ACCORDING TO SLIDING SCALE - MAX DOSE 48 UNITS IN 24 HOURS 20 mL 6   methadone  (DOLOPHINE ) 1 MG/1ML solution Take 100 mg by mouth daily. Methadone  clinic     metoCLOPramide  (REGLAN ) 10 MG tablet TAKE 1 TABLET BY MOUTH TWICE DAILY AS NEEDED FOR NAUSEA OR VOMITING 180 tablet 0   pantoprazole  (PROTONIX ) 40 MG tablet Take 1 tablet (40 mg total) by mouth daily. 90 tablet 1   sodium bicarbonate 650 MG tablet Take 650 mg by mouth 2 (two) times daily.     sodium zirconium cyclosilicate (LOKELMA) 10 g PACK packet Take 10 g by mouth every other day.     No current facility-administered medications for this visit.    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)     Past Surgical History:  Procedure Laterality Date   APPENDECTOMY     AV FISTULA PLACEMENT Left 11/17/2023   Procedure: ARTERIOVENOUS (AV) FISTULA CREATION;  Surgeon: Marea Selinda RAMAN, MD;  Location: [facility] OR[facility];  Service: Vascular;  Laterality: Left;   CYSTOSCOPY, WITH RETROGRADE PYELOGRAM, URETEROSCOPY, URINARY CALCULUS LASER LITHOTRIPSY, AND STENT INSERT     FOOT SURGERY Left 2019   MYRINGOTOMY       Social History   Tobacco Use   Smoking status: Every Day    Current  packs/day: 1.00    Average packs/day: 1 pack/day for 21.7 years (21.7 ttl pk-yrs)    Types: Cigarettes    Start date: 08/30/2002   Smokeless tobacco: Never   Tobacco comments:    1/2 pack daily.  Vaping Use   Vaping status: Former  Substance Use Topics   Alcohol use: Not Currently   Drug use: Yes    Types: Marijuana, Cocaine, Heroin    Comment: last stated cocaine use ; marijuana daily      Family History  Problem Relation Age of Onset   Heart disease Mother    Hypertension Mother    Obesity Mother    Gout Mother    Thyroid disease Mother    High blood pressure Maternal Grandmother    Dementia Maternal Grandmother    Heart disease Maternal Grandfather      Allergies  Allergen Reactions   Augmentin [Amoxicillin-Pot Clavulanate] Nausea And Vomiting   Sulfa Antibiotics Nausea And Vomiting     REVIEW OF SYSTEMS (Negative unless checked)   Constitutional: [] Weight loss  [] Fever  [] Chills Cardiac: [] Chest pain   [] Chest pressure   [] Palpitations   [] Shortness of breath when laying flat   [] Shortness of breath at rest   [] Shortness of breath with exertion. Vascular:  [] Pain in legs with walking   [] Pain in legs at rest   [] Pain in legs when laying flat   [] Claudication   [] Pain in feet when walking  [] Pain in feet at rest  [] Pain in feet when laying flat   [] History of DVT   [] Phlebitis   [] Swelling in legs   [] Varicose veins   [x] Non-healing ulcers Pulmonary:   [] Uses home oxygen   [] Productive cough   [] Hemoptysis   [] Wheeze  [] COPD   [] Asthma Neurologic:  [] Dizziness  [] Blackouts   [] Seizures   [] History of stroke   [] History of TIA  [] Aphasia   [] Temporary blindness   [] Dysphagia   [] Weakness or numbness in arms   [] Weakness or numbness in legs Musculoskeletal:  [] Arthritis   [] Joint swelling   [x] Joint pain   [] Low back pain Hematologic:  [] Easy bruising  [] Easy bleeding   [] Hypercoagulable state   [x] Anemic   Gastrointestinal:  [] Blood in stool   [] Vomiting blood   [] Gastroesophageal reflux/heartburn   [] Abdominal pain Genitourinary:  [x] Chronic kidney disease   [] Difficult urination  [] Frequent urination  [] Burning with urination   [] Hematuria Skin:  [] Rashes   [x] Ulcers   [x] Wounds Psychological:  [] History of anxiety   []   History of major depression.  Physical Examination  BP 134/81   Pulse 81  Gen:  WD/WN, NAD. Appears older than stated age. Head: [place]/AT, No temporalis wasting. Ear/Nose/Throat: Hearing grossly intact, nares w/o erythema or drainage Eyes: Conjunctiva clear. Sclera non-icteric Neck: Supple.  Trachea midline Pulmonary:  Good air movement, no use of accessory muscles.  Cardiac: RRR, no JVD Vascular:  Vessel Right Left  Radial Palpable Palpable           Musculoskeletal: M/S 5/5 throughout.  Left foot with marked Charcot deformity.  1+ lower extremity edema. Neurologic: Sensation grossly intact in extremities.  Symmetrical.  Speech is fluent.  Psychiatric: Judgment intact, Mood & affect appropriate for pt's clinical situation. Dermatologic: Left arm incision is now completely healed.      Labs Recent Results (from the past 2160 hours)  POCT glycosylated hemoglobin (Hb A1C)     Status: Abnormal   Collection Time: 03/09/24  9:44 AM  Result Value Ref Range   Hemoglobin A1C 7.5 (A) 4.0 - 5.6 %   HbA1c POC (<> result, manual entry)     HbA1c, POC (prediabetic range)     HbA1c, POC (controlled diabetic range)      Radiology No results found.  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 Also, involved in wound healing. On appropriate medications.   Hypertension associated with diabetes (HCC) blood pressure control important in reducing the progression of atherosclerotic disease. On appropriate oral medications.   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  Has  previously considered left below-knee amputation and ultimately will likely still need this.  I do think an imaging study to evaluate for active infection is prudent given the fact that we are looking at likely having to place an AV graft in his arm and he is being evaluated for a kidney transplant.  I will order an MRI of his left ankle.  We can see him back to review the results.  CKD (chronic kidney disease), stage IV (HCC) No usable superficial vein.  We will try to time graft placement over about a month or so before he starts dialysis to allow this to heal and avoid catheter if at all possible.  I will try to coordinate this with his nephrologist who is managing his renal issues.    Selinda Gu, MD  05/29/2024 11:48 AM    This note was created with Dragon medical transcription system.  Any errors from dictation are purely unintentional

## 2024-05-29 NOTE — Assessment & Plan Note (Signed)
 With Charcot deformity.  Has previously considered left below-knee amputation and ultimately will likely still need this.  I do think an imaging study to evaluate for active infection is prudent given the fact that we are looking at likely having to place an AV graft in his arm and he is being evaluated for a kidney transplant.  I will order an MRI of his left ankle.  We can see him back to review the results.

## 2024-06-04 ENCOUNTER — Telehealth (INDEPENDENT_AMBULATORY_CARE_PROVIDER_SITE_OTHER): Payer: Self-pay | Admitting: Vascular Surgery

## 2024-06-04 NOTE — Telephone Encounter (Signed)
 Spoke with patient advising of the status of prior auth for the MRI ordered by Dr. Marea. [employer] is currently sending in case for medical review. I called [employer] this morning advising that I don't have any additional information to send them. Representative states that she will push through to Medical Review. Will await review. Call ref # [id number] Patient acknowledged

## 2024-06-07 ENCOUNTER — Telehealth (INDEPENDENT_AMBULATORY_CARE_PROVIDER_SITE_OTHER): Payer: Self-pay | Admitting: Vascular Surgery

## 2024-06-07 NOTE — Telephone Encounter (Signed)
 Spoke with [employer] to check status on pending prior auth. Was told that they have received all clinicals and are waiting on a decision. I will be notified by fax and phone call at the time of decision.

## 2024-06-12 ENCOUNTER — Telehealth (INDEPENDENT_AMBULATORY_CARE_PROVIDER_SITE_OTHER): Payer: Self-pay | Admitting: Vascular Surgery

## 2024-06-12 NOTE — Telephone Encounter (Signed)
 Spoke with patient and advised that the MRI ordered by Dr. Marea has been denied. I advised that his insurance as of now would not pay for it and we are trying to set up a peer to peer to see if we can get it approved. I advised patient that he may have to cancel appt already scheduled and pt acknowledged. I advised pt that I will be in touch after I know what is going on with insurance. Pt acknowledged.

## 2024-06-20 ENCOUNTER — Ambulatory Visit: Payer: MEDICAID

## 2024-06-21 ENCOUNTER — Other Ambulatory Visit (INDEPENDENT_AMBULATORY_CARE_PROVIDER_SITE_OTHER): Payer: Self-pay | Admitting: Nurse Practitioner

## 2024-06-21 DIAGNOSIS — M86372 Chronic multifocal osteomyelitis, left ankle and foot: Secondary | ICD-10-CM

## 2024-06-25 ENCOUNTER — Telehealth (INDEPENDENT_AMBULATORY_CARE_PROVIDER_SITE_OTHER): Payer: Self-pay | Admitting: Vascular Surgery

## 2024-06-25 NOTE — Telephone Encounter (Signed)
 Spoke with patient's mother. I advised that he can call radiology scheduling to schedule the x-rays that Orvin Daring, NP ordered due to his insurance denying the MRI originally ordered. I advised the same number to call to schedule. I advised to call with any questions.

## 2024-07-10 ENCOUNTER — Ambulatory Visit
Admission: RE | Admit: 2024-07-10 | Discharge: 2024-07-10 | Disposition: A | Payer: MEDICAID | Source: Ambulatory Visit | Attending: Nurse Practitioner

## 2024-07-10 ENCOUNTER — Ambulatory Visit
Admission: RE | Admit: 2024-07-10 | Discharge: 2024-07-10 | Disposition: A | Discharge status: Home / Self Care | Payer: MEDICAID | Attending: Nurse Practitioner | Admitting: Nurse Practitioner

## 2024-07-10 DIAGNOSIS — M86372 Chronic multifocal osteomyelitis, left ankle and foot: Secondary | ICD-10-CM

## 2024-07-13 ENCOUNTER — Encounter: Payer: Self-pay | Admitting: Nurse Practitioner

## 2024-07-13 ENCOUNTER — Ambulatory Visit: Payer: MEDICAID | Admitting: Nurse Practitioner

## 2024-07-13 VITALS — BP 135/80 | HR 73 | Temp 96.5°F | Resp 16 | Ht 70.0 in | Wt 216.4 lb

## 2024-07-13 DIAGNOSIS — N184 Chronic kidney disease, stage 4 (severe): Secondary | ICD-10-CM

## 2024-07-13 DIAGNOSIS — E1022 Type 1 diabetes mellitus with diabetic chronic kidney disease: Secondary | ICD-10-CM

## 2024-07-13 DIAGNOSIS — I152 Hypertension secondary to endocrine disorders: Secondary | ICD-10-CM | POA: Diagnosis not present

## 2024-07-13 DIAGNOSIS — M62838 Other muscle spasm: Secondary | ICD-10-CM | POA: Diagnosis not present

## 2024-07-13 DIAGNOSIS — K219 Gastro-esophageal reflux disease without esophagitis: Secondary | ICD-10-CM | POA: Diagnosis not present

## 2024-07-13 DIAGNOSIS — E1159 Type 2 diabetes mellitus with other circulatory complications: Secondary | ICD-10-CM | POA: Diagnosis not present

## 2024-07-13 LAB — POCT GLYCOSYLATED HEMOGLOBIN (HGB A1C): Hemoglobin A1C: 8 % — AB (ref 4.0–5.6)

## 2024-07-13 MED ORDER — PANTOPRAZOLE SODIUM 40 MG PO TBEC: 40.0000 mg | DELAYED_RELEASE_TABLET | Freq: Every day | ORAL | 1 refills | Status: AC

## 2024-07-13 MED ORDER — CYCLOBENZAPRINE HCL 10 MG PO TABS: 10.0000 mg | ORAL_TABLET | Freq: Two times a day (BID) | ORAL | 3 refills | Status: DC | PRN

## 2024-07-13 MED ORDER — INSULIN NPH (HUMAN) (ISOPHANE) 100 UNIT/ML ~~LOC~~ SUSP: 28.0000 [IU] | Freq: Two times a day (BID) | SUBCUTANEOUS | 11 refills | Status: DC

## 2024-07-13 NOTE — Progress Notes (Signed)
 [facility] [address] [place], [place] [address]  Internal MEDICINE  Office Visit Note  Patient Name: Jason Francis  [id number]  [id number]  Date of Service: 07/13/2024  Chief Complaint  Patient presents with   Diabetes   Gastroesophageal Reflux   Hypertension   Follow-up    HPI Jason Francis presents for a follow-up visit for diabetes, CKD stage 4, neck spasms, GERD, refills.  Diabetes -- type 1, still not well controlled. Ran out of insulin  early due to increasing his insulin  N dose to 28 twice daily. A1c is 8.0 today which is up from 7.5 previously. We have discussed endocrinology referral in the past but he has declined.  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 Nephrology plan to do a graft right before he needs to start hemodialysis  Neck pain -- takes cyclobenzaprine  as needed  GERD -- controlled with pantoprazole .    Current Medication: Outpatient Encounter Medications as of 07/13/2024  Medication Sig   Accu-Chek Softclix Lancets lancets Use 1 lancet to check glucose 4 times daily and as needed for diabetes E11.65   acetaminophen  (TYLENOL ) 500 MG tablet Take 1,000 mg by mouth every 6 (six) hours as needed.   amLODipine  (NORVASC ) 10 MG tablet Take 10 mg by mouth every evening.   calcitRIOL (ROCALTROL) 0.25 MCG capsule Take 0.25 mcg by mouth daily.   carvedilol (COREG) 6.25 MG tablet Take 6.25 mg by mouth 2 (two) times daily with a meal.   cloNIDine (CATAPRES) 0.1 MG tablet Take 0.1 mg by mouth 2 (two) times daily.   cyclobenzaprine  (FLEXERIL ) 10 MG tablet Take 1 tablet (10 mg total) by mouth 2 (two) times daily as needed for muscle spasms.   Ferrous Sulfate (IRON ) 28 MG TABS Take 2 tablets by mouth daily.   glucose blood (ACCU-CHEK GUIDE TEST) test strip Use 1 test strip to check glucose 4 times daily and as needed for diabetes E11.65   insulin  NPH Human (NOVOLIN  N) 100 UNIT/ML injection Inject 0.28 mLs (28 Units total) into the skin 2 (two) times daily before a meal.    insulin  regular (HUMULIN R ) 100 units/mL injection INJECT INTO SKIN BEFORE MEALS ACCORDING TO SLIDING SCALE - MAX DOSE 48 UNITS IN 24 HOURS   methadone  (DOLOPHINE ) 1 MG/1ML solution Take 100 mg by mouth daily. Methadone  clinic   metoCLOPramide  (REGLAN ) 10 MG tablet TAKE 1 TABLET BY MOUTH TWICE DAILY AS NEEDED FOR NAUSEA OR VOMITING   pantoprazole  (PROTONIX ) 40 MG tablet Take 1 tablet (40 mg total) by mouth daily.   sodium bicarbonate 650 MG tablet Take 650 mg by mouth 2 (two) times daily.   [DISCONTINUED] Blood Glucose Monitoring Suppl (ACCU-CHEK GUIDE ME) w/Device KIT Use as directed ( e11.65)   [DISCONTINUED] cyclobenzaprine  (FLEXERIL ) 10 MG tablet Take 1 tablet (10 mg total) by mouth 2 (two) times daily as needed for muscle spasms.   [DISCONTINUED] insulin  NPH Human (NOVOLIN  N) 100 UNIT/ML injection Inject 0.2 mLs (20 Units total) into the skin 2 (two) times daily before a meal.   [DISCONTINUED] pantoprazole  (PROTONIX ) 40 MG tablet Take 1 tablet (40 mg total) by mouth daily.   [DISCONTINUED] sodium zirconium cyclosilicate (LOKELMA) 10 g PACK packet Take 10 g by mouth every other day. (Patient not taking: Reported on 07/13/2024)   No facility-administered encounter medications on file as of 07/13/2024.    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)    CKD (chronic kidney disease), stage IV (HCC)    Diabetic Charcot foot (HCC)    GERD (gastroesophageal reflux disease)    Hepatitis C     History of kidney stones    Hypertension    MRSA infection    in blood per patient   Nausea & vomiting 02/14/2022   Polysubstance abuse (HCC)    a.) THC + cocaine + TCA + IVDU   Secondary hyperparathyroidism    Substance abuse (HCC)    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)     Family History: Family History  Problem Relation Age of Onset   Heart disease Mother    Hypertension Mother    Obesity Mother    Gout Mother    Thyroid disease Mother    High blood pressure Maternal Grandmother    Dementia Maternal Grandmother    Heart disease Maternal Grandfather     Social History   Socioeconomic History   Marital status: Media Planner    Spouse name: Not on file   Number of children: Not on file   Years of education: Not on file   Highest education level: Not on file  Occupational History   Not on file  Tobacco Use   Smoking status: Every Day    Current packs/day: 1.00    Average packs/day: 1 pack/day for 21.9 years (21.9 ttl pk-yrs)    Types: Cigarettes    Start date: 08/30/2002   Smokeless tobacco: Never   Tobacco comments:    1/2 pack daily.  Vaping Use   Vaping status: Former  Substance and Sexual Activity   Alcohol use: Not Currently   Drug use: Yes    Types: Marijuana, Cocaine, Heroin    Comment: last stated cocaine use ; marijuana daily   Sexual activity: Yes    Partners: Female  Other Topics Concern   Not on file  Social History Narrative   Not on file   Social Drivers of Health   Financial Resource Strain: Low Risk  (03/01/2018)   Received [facility]   Overall Financial Resource Strain (CARDIA)    Difficulty of Paying Living Expenses: Not hard at all  Food Insecurity: No Food Insecurity (01/21/2023)   Hunger Vital Sign    Worried About Running Out of Food in the Last Year: Never true    Ran Out of Food in the Last Year: Never true  Transportation Needs: No Transportation Needs (01/21/2023)   PRAPARE -  Administrator, Civil Service (Medical): No    Lack of Transportation (Non-Medical): No  Physical Activity: Inactive (03/01/2018)   Received from [facility]   Exercise Vital Sign    Days of Exercise per Week: 0 days    Minutes of Exercise per Session: 0 min  Stress: Stress Concern Present (03/01/2018)   Received from [facility] of Occupational Health - Occupational Stress Questionnaire    Feeling of Stress : Rather much  Social Connections: Unknown (03/01/2018)  Received from [facility]   Social Connection and Isolation Panel    Frequency of Communication with Friends and Family: Three times a week    Frequency of Social Gatherings with Friends and Family: Three times a week    Attends Religious Services: Never    Active Member of Clubs or Organizations: No    Attends Banker Meetings: Never    Marital Status: Not on file  Intimate Partner Violence: Not At Risk (01/21/2023)   Humiliation, Afraid, Rape, and Kick questionnaire    Fear of Current or Ex-Partner: No    Emotionally Abused: No    Physically Abused: No    Sexually Abused: No      Review of Systems  Constitutional:  Negative for chills, fatigue and unexpected weight change.  HENT:  Positive for dental problem and postnasal drip. Negative for congestion, rhinorrhea, sneezing and sore throat.   Eyes:  Negative for redness.  Respiratory: Negative.  Negative for cough, chest tightness, shortness of breath and wheezing.   Cardiovascular: Negative.  Negative for chest pain and palpitations.  Gastrointestinal:  Negative for abdominal pain, constipation, diarrhea, nausea and vomiting.  Genitourinary:  Negative for dysuria and frequency.  Musculoskeletal:  Positive for arthralgias (Left neck pain), gait problem (Due to Charcot's joint of the left ankle, ambulates with crutches) and neck pain (Left side). Negative for back pain and  joint swelling.  Skin: Negative.  Negative for rash.  Neurological:  Negative for tremors and numbness.  Hematological:  Negative for adenopathy. Does not bruise/bleed easily.  Psychiatric/Behavioral:  Negative for behavioral problems (Depression), sleep disturbance and suicidal ideas. The patient is not nervous/anxious.     Vital Signs: BP 135/80 Comment: 146/88  Pulse 73   Temp (!) 96.5 F (35.8 C)   Resp 16   Ht 5' 10 (1.778 m)   Wt 216 lb 6.4 oz (98.2 kg)   SpO2 98%   BMI 31.05 kg/m    Physical Exam Vitals reviewed.  Constitutional:      General: He is not in acute distress.    Appearance: Normal appearance. He is not ill-appearing.  HENT:     Head: Normocephalic and atraumatic.     Mouth/Throat:     Dentition: Abnormal dentition. Dental tenderness, gingival swelling and dental caries present.  Eyes:     Pupils: Pupils are equal, round, and reactive to light.  Cardiovascular:     Rate and Rhythm: Normal rate and regular rhythm.  Pulmonary:     Effort: Pulmonary effort is normal. No respiratory distress.  Neurological:     Mental Status: He is alert and oriented to person, place, and time.     Gait: Gait abnormal (Due to Charcot's joint of the left ankle).  Psychiatric:        Mood and Affect: Mood normal.        Behavior: Behavior normal.        Assessment/Plan: 1. 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 A1c is elevated at 8.0. continue current insulin  dose for now.  - POCT glycosylated hemoglobin (Hb A1C) - insulin  NPH Human (NOVOLIN  N) 100 UNIT/ML injection; Inject 0.28 mLs (28 Units total) into the skin 2 (two) times daily before a meal.  Dispense: 30 mL; Refill: 11 - Ambulatory referral to Endocrinology  2. CKD (chronic kidney disease), stage IV (HCC) Followed by nephrology. Waiting to start hemodialysis, AV fistula did not take, waiting for graft now.  3. Hypertension associated with  diabetes (HCC) Controlled with current medications, continue as prescribed.   4. Gastroesophageal reflux disease without esophagitis Stable, continue pantoprazole  as prescribed.  - pantoprazole  (PROTONIX ) 40 MG tablet; Take 1 tablet (40 mg total) by mouth daily.  Dispense: 90 tablet; Refill: 1  5. Muscle spasms of neck Continue prn cyclobenzaprine  as prescribed.  - cyclobenzaprine  (FLEXERIL ) 10 MG tablet; Take 1 tablet (10 mg total) by mouth 2 (two) times daily as needed for muscle spasms.  Dispense: 60 tablet; Refill: 3   General Counseling: Jason Francis understanding of the findings of todays visit and agrees with plan of treatment. I have discussed any further diagnostic evaluation that may be needed or ordered today. We also reviewed his medications today. he has been encouraged to call the office with any questions or concerns that should arise related to todays visit.    Orders Placed This Encounter  Procedures   Ambulatory referral to Endocrinology   POCT glycosylated hemoglobin (Hb A1C)    Meds ordered this encounter  Medications   insulin  NPH Human (NOVOLIN  N) 100 UNIT/ML injection    Sig: Inject 0.28 mLs (28 Units total) into the skin 2 (two) times daily before a meal.    Dispense:  30 mL    Refill:  11    Dx code E11.65, for future refills   pantoprazole  (PROTONIX ) 40 MG tablet    Sig: Take 1 tablet (40 mg total) by mouth daily.    Dispense:  90 tablet    Refill:  1   cyclobenzaprine  (FLEXERIL ) 10 MG tablet    Sig: Take 1 tablet (10 mg total) by mouth 2 (two) times daily as needed for muscle spasms.    Dispense:  60 tablet    Refill:  3    For future refills    Return in about 4 months (around 11/10/2024) for F/U, Rhylen Shaheen PCP.   Total time spent:30 Minutes Time spent includes review of chart, medications, test results, and follow up plan with the patient.   [place] Controlled Substance Database was reviewed by me.  This patient was seen by Mardy Maxin,  FNP-C in collaboration with Dr. Sigrid Bathe as a part of collaborative care agreement.   Dugan Vanhoesen R. Maxin, MSN, FNP-C Internal medicine

## 2024-07-16 ENCOUNTER — Encounter (INDEPENDENT_AMBULATORY_CARE_PROVIDER_SITE_OTHER): Payer: Self-pay | Admitting: Nurse Practitioner

## 2024-07-16 ENCOUNTER — Ambulatory Visit (INDEPENDENT_AMBULATORY_CARE_PROVIDER_SITE_OTHER): Payer: MEDICAID | Admitting: Nurse Practitioner

## 2024-07-16 ENCOUNTER — Telehealth: Payer: Self-pay | Admitting: Nurse Practitioner

## 2024-07-16 VITALS — BP 133/80 | HR 66 | Resp 18 | Ht 68.0 in | Wt 218.0 lb

## 2024-07-16 DIAGNOSIS — N184 Chronic kidney disease, stage 4 (severe): Secondary | ICD-10-CM

## 2024-07-16 DIAGNOSIS — M86372 Chronic multifocal osteomyelitis, left ankle and foot: Secondary | ICD-10-CM

## 2024-07-16 DIAGNOSIS — I152 Hypertension secondary to endocrine disorders: Secondary | ICD-10-CM

## 2024-07-16 DIAGNOSIS — E1159 Type 2 diabetes mellitus with other circulatory complications: Secondary | ICD-10-CM | POA: Diagnosis not present

## 2024-07-16 NOTE — Telephone Encounter (Signed)
 Awaiting 07/13/24 office notes for Endocrinology referral-Toni

## 2024-07-16 NOTE — Progress Notes (Signed)
 SUBJECTIVE:  Patient ID: Jason Francis, male    DOB: 1991/07/20, 33 y.o.   MRN: [id number] Chief Complaint  Patient presents with   Follow-up    Follow up after xray    Discussed the use of AI scribe software for clinical note transcription with the patient, who gave verbal consent to proceed.  History of Present Illness Jason Francis is a 33 year old male who presents for evaluation of potential osteomyelitis.  He has a history of chronic ankle issues with persistent swelling and pain. There has been no recent increase in pain, swelling, or redness. No fever or chills are present.  An x-ray showed severe bone deterioration but was inconclusive for ruling out osteomyelitis. An MRI is needed for further evaluation.  No fever, chills, increased pain, or redness.  The patient is chronic kidney disease stage IV and will likely need dialysis access soon.  He previously had an AV fistula which failed to mature.  Given the patient's previous history of IV drug use superficial veins for placement of an AV fistula are no longer an option.  He will require an AV graft but this cannot be inserted if he has osteomyelitis as it would pose potential infection risk to the graft itself.     Results RADIOLOGY Leg X-ray:  Findings of severe arthropathy of the left ankle and midfoot, likely neuropathic/Charcot arthropathy. Progressive resorption or erosion of the distal tibia and fibula. Superimposed osteomyelitis not excluded.   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)     Past Surgical History:  Procedure Laterality Date   APPENDECTOMY     AV FISTULA PLACEMENT Left 11/17/2023   Procedure: ARTERIOVENOUS (AV) FISTULA CREATION;  Surgeon: Marea Selinda RAMAN, MD;  Location: [facility] ORS;  Service: Vascular;  Laterality: Left;   CYSTOSCOPY, WITH RETROGRADE PYELOGRAM, URETEROSCOPY, URINARY CALCULUS LASER LITHOTRIPSY, AND STENT INSERT     FOOT SURGERY Left 2019   MYRINGOTOMY      Social History   Socioeconomic History   Marital status: Media Planner    Spouse name: Not on file   Number of children: Not on file   Years of education: Not on file   Highest education level: Not on file  Occupational History   Not on file  Tobacco Use   Smoking status: Every Day    Current packs/day: 1.00    Average packs/day: 1 pack/day for 21.9 years (21.9 ttl pk-yrs)    Types: Cigarettes    Start date: 08/30/2002   Smokeless tobacco: Never   Tobacco comments:    1/2 pack daily.  Vaping Use   Vaping status: Former  Substance and Sexual Activity   Alcohol use: Not Currently   Drug use: Yes    Types: Marijuana, Cocaine, Heroin    Comment: last stated cocaine use ; marijuana daily   Sexual  activity: Yes    Partners: Female  Other Topics Concern   Not on file  Social History Narrative   Not on file   Social Drivers of Health   Financial Resource Strain: Low Risk  (03/01/2018)   Received from [facility]   Overall Financial Resource Strain (CARDIA)    Difficulty of Paying Living Expenses: Not hard at all  Food Insecurity: No Food Insecurity (01/21/2023)   Hunger Vital Sign    Worried About Running Out of Food in the Last Year: Never true    Ran Out of Food in the Last Year: Never true  Transportation Needs: No Transportation Needs (01/21/2023)   PRAPARE - Administrator, Civil Service (Medical): No    Lack of Transportation (Non-Medical): No  Physical  Activity: Inactive (03/01/2018)   Received from [facility]   Exercise Vital Sign    Days of Exercise per Week: 0 days    Minutes of Exercise per Session: 0 min  Stress: Stress Concern Present (03/01/2018)   Received from [facility] of Occupational Health - Occupational Stress Questionnaire    Feeling of Stress : Rather much  Social Connections: Unknown (03/01/2018)   Received from [facility]   Social Connection and Isolation Panel    Frequency of Communication with Friends and Family: Three times a week    Frequency of Social Gatherings with Friends and Family: Three times a week    Attends Religious Services: Never    Active Member of Clubs or Organizations: No    Attends Banker Meetings: Never    Marital Status: Not on file  Intimate Partner Violence: Not At Risk (01/21/2023)   Humiliation, Afraid, Rape, and Kick questionnaire    Fear of Current or Ex-Partner: No    Emotionally Abused: No    Physically Abused: No    Sexually Abused: No    Family History  Problem Relation Age of Onset   Heart disease Mother    Hypertension Mother    Obesity Mother    Gout Mother    Thyroid disease Mother    High blood pressure Maternal Grandmother    Dementia Maternal Grandmother    Heart disease Maternal Grandfather     Allergies  Allergen Reactions   Augmentin [Amoxicillin-Pot Clavulanate] Nausea And Vomiting   Sulfa Antibiotics Nausea And Vomiting     Review of Systems   Review of Systems: Negative Unless Checked Constitutional: [] Weight loss  [] Fever  [] Chills Cardiac: [] Chest pain   []  Atrial Fibrillation  [] Palpitations   [] Shortness of breath when laying flat   [] Shortness of breath with exertion. [] Shortness of breath at rest Vascular:  [x] Pain in legs with walking   [x] Pain in legs with standing [] Pain in legs when laying flat   [] Claudication    [] Pain in feet when laying flat    [] History  of DVT   [] Phlebitis   [x] Swelling in legs   [] Varicose veins   [] Non-healing ulcers Pulmonary:   [] Uses home oxygen   [] Productive cough   [] Hemoptysis   [] Wheeze  [] COPD   [] Asthma Neurologic:  [] Dizziness   [] Seizures  [] Blackouts [] History of stroke   [] History of TIA  [] Aphasia   [] Temporary Blindness   [] Weakness or numbness in arm   [] Weakness or numbness in leg Musculoskeletal:   [x] Joint swelling   [] Joint pain   [] Low back pain  []  History of Knee Replacement [] Arthritis []   back Surgeries  []  Spinal Stenosis    Hematologic:  [] Easy bruising  [] Easy bleeding   [] Hypercoagulable state   [] Anemic Gastrointestinal:  [] Diarrhea   [] Vomiting  [] Gastroesophageal reflux/heartburn   [] Difficulty swallowing. [] Abdominal pain Genitourinary:  [] Chronic kidney disease   [] Difficult urination  [] Anuric   [] Blood in urine [] Frequent urination  [] Burning with urination   [] Hematuria Skin:  [] Rashes   [] Ulcers [] Wounds Psychological:  [] History of anxiety   []  History of major depression  []  Memory Difficulties      OBJECTIVE:     BP 133/80 (BP Location: Right Arm, Patient Position: Sitting, Cuff Size: Normal)   Pulse 66   Resp 18   Ht 5' 8 (1.727 m)   Wt 218 lb (98.9 kg)   BMI 33.15 kg/m   Physical Exam Vitals reviewed.  HENT:     Head: Normocephalic.  Cardiovascular:     Rate and Rhythm: Normal rate.  Pulmonary:     Effort: Pulmonary effort is normal.  Musculoskeletal:     Left lower leg: Edema present.  Skin:    General: Skin is warm and dry.  Neurological:     Mental Status: He is alert.     Gait: Gait abnormal.  Psychiatric:        Mood and Affect: Mood normal.        Behavior: Behavior normal.        Thought Content: Thought content normal.        Judgment: Judgment normal.    Physical Exam     CMP     Component Value Date/Time   NA 133 (L) 01/06/2024 0937   K 4.6 01/06/2024 0937   CL 103 01/06/2024 0937   CO2 21 (L) 01/06/2024 0937   GLUCOSE 208 (H) 01/06/2024  0937   BUN 57 (H) 01/06/2024 0937   CREATININE 4.69 (H) 01/06/2024 0937   CREATININE 4.38 (H) 11/16/2023 1409   CALCIUM 9.1 01/06/2024 0937   PROT 8.8 (H) 11/16/2023 1409   ALBUMIN 3.9 11/16/2023 1409   AST 33 11/16/2023 1409   ALT 40 11/16/2023 1409   ALKPHOS 97 11/16/2023 1409   BILITOT 0.5 11/16/2023 1409   GFRNONAA 16 (L) 01/06/2024 0937   GFRNONAA 17 (L) 11/16/2023 1409    No results found.     ASSESSMENT AND PLAN:  1. 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 X-ray inconclusive. MRI required for definitive diagnosis. Concern for infection affecting graft placement. - Ordered MRI of lower extremity.  Has previously considered left below-knee amputation and ultimately will likely still need this. I do think an imaging study to evaluate for active infection is prudent given the fact that we are looking at likely having to place an AV graft in his arm and he is being evaluated for a kidney transplant. I will order an MRI of his left ankle. We can see him back to review the results.  - MR ANKLE LEFT W CONTRAST; Future  2. CKD (chronic kidney disease), stage IV (HCC) No usable superficial vein. We will try to time graft placement over about a month or so before he starts dialysis to allow this to heal and avoid catheter if at all possible. I will try to coordinate this with his nephrologist who is managing his renal issues.   3. Hypertension associated with diabetes (HCC) blood pressure control important in reducing the progression of atherosclerotic disease. On appropriate oral medications.  Current Outpatient Medications on File Prior to Visit  Medication Sig Dispense Refill   Accu-Chek Softclix Lancets lancets Use 1 lancet to check glucose 4 times daily and as needed for diabetes E11.65 200 each 12   acetaminophen  (TYLENOL ) 500 MG tablet Take 1,000 mg  by mouth every 6 (six) hours as needed.     amLODipine  (NORVASC ) 10 MG tablet Take 10 mg by mouth every evening.     Blood Glucose Monitoring Suppl (ACCU-CHEK GUIDE ME) w/Device KIT USE AS DIRECTED 1 kit 0   calcitRIOL (ROCALTROL) 0.25 MCG capsule Take 0.25 mcg by mouth daily.     carvedilol (COREG) 6.25 MG tablet Take 6.25 mg by mouth 2 (two) times daily with a meal.     cloNIDine (CATAPRES) 0.1 MG tablet Take 0.1 mg by mouth 2 (two) times daily.     cyclobenzaprine  (FLEXERIL ) 10 MG tablet Take 1 tablet (10 mg total) by mouth 2 (two) times daily as needed for muscle spasms. 60 tablet 3   Ferrous Sulfate (IRON ) 28 MG TABS Take 2 tablets by mouth daily.     glucose blood (ACCU-CHEK GUIDE TEST) test strip Use 1 test strip to check glucose 4 times daily and as needed for diabetes E11.65 200 each 12   insulin  NPH Human (NOVOLIN  N) 100 UNIT/ML injection Inject 0.28 mLs (28 Units total) into the skin 2 (two) times daily before a meal. 30 mL 11   insulin  regular (HUMULIN R ) 100 units/mL injection INJECT INTO SKIN BEFORE MEALS ACCORDING TO SLIDING SCALE - MAX DOSE 48 UNITS IN 24 HOURS 20 mL 6   methadone  (DOLOPHINE ) 1 MG/1ML solution Take 100 mg by mouth daily. Methadone  clinic     metoCLOPramide  (REGLAN ) 10 MG tablet TAKE 1 TABLET BY MOUTH TWICE DAILY AS NEEDED FOR NAUSEA OR VOMITING 180 tablet 0   pantoprazole  (PROTONIX ) 40 MG tablet Take 1 tablet (40 mg total) by mouth daily. 90 tablet 1   sodium bicarbonate 650 MG tablet Take 650 mg by mouth 2 (two) times daily.     No current facility-administered medications on file prior to visit.    There are no Patient Instructions on file for this visit. No follow-ups on file.   Crystina Borrayo E Tarus Briski, NP  This note was completed with Office Manager.  Any errors are purely unintentional.

## 2024-07-23 ENCOUNTER — Ambulatory Visit: Admission: RE | Admit: 2024-07-23 | Payer: MEDICAID | Source: Ambulatory Visit

## 2024-08-07 ENCOUNTER — Ambulatory Visit: Payer: MEDICAID | Admitting: Internal Medicine

## 2024-08-07 ENCOUNTER — Encounter: Payer: Self-pay | Admitting: Internal Medicine

## 2024-08-07 VITALS — BP 138/80 | HR 69 | Temp 98.0°F | Resp 16 | Ht 68.0 in | Wt 218.0 lb

## 2024-08-07 DIAGNOSIS — N184 Chronic kidney disease, stage 4 (severe): Secondary | ICD-10-CM

## 2024-08-07 DIAGNOSIS — L03031 Cellulitis of right toe: Secondary | ICD-10-CM

## 2024-08-07 DIAGNOSIS — L84 Corns and callosities: Secondary | ICD-10-CM

## 2024-08-07 MED ORDER — DOXYCYCLINE HYCLATE 100 MG PO TABS: 100.0000 mg | ORAL_TABLET | Freq: Two times a day (BID) | ORAL | 1 refills | Status: DC

## 2024-08-07 NOTE — Progress Notes (Signed)
 [facility] [address] [place], [place] [address]  Internal MEDICINE  Office Visit Note  Patient Name: Jason Francis  [id number]  [id number]  Date of Service: 08/07/2024  Chief Complaint  Patient presents with   Follow-up    Diabetes, HTN   Quality Metric Gaps    Eye Exam    HPI Pt is seen for f/u for urgent care, he gives h/o bunion problems, developed pain and swelling, has finished a course of antibiotics. The infection is not resolved and will like to have more abx. There is callus at the toa base, erythema but no pus is present  Pt has type1 DM with CKD  Has been seen by vascular, ?? Osteomyelitis right ankle     Current Medication: Outpatient Encounter Medications as of 08/07/2024  Medication Sig   Accu-Chek Softclix Lancets lancets Use 1 lancet to check glucose 4 times daily and as needed for diabetes E11.65   acetaminophen  (TYLENOL ) 500 MG tablet Take 1,000 mg by mouth every 6 (six) hours as needed.   amLODipine  (NORVASC ) 10 MG tablet Take 10 mg by mouth every evening.   Blood Glucose Monitoring Suppl (ACCU-CHEK GUIDE ME) w/Device KIT USE AS DIRECTED   calcitRIOL (ROCALTROL) 0.25 MCG capsule Take 0.25 mcg by mouth daily.   carvedilol (COREG) 6.25 MG tablet Take 6.25 mg by mouth 2 (two) times daily with a meal.   cloNIDine (CATAPRES) 0.1 MG tablet Take 0.1 mg by mouth 2 (two) times daily.   cyclobenzaprine  (FLEXERIL ) 10 MG tablet Take 1 tablet (10 mg total) by mouth 2 (two) times daily as needed for muscle spasms.   doxycycline  (VIBRA -TABS) 100 MG tablet Take 1 tablet (100 mg total) by mouth 2 (two) times daily.   Ferrous Sulfate (IRON ) 28 MG TABS Take 2 tablets by mouth daily.   glucose blood (ACCU-CHEK GUIDE TEST) test strip Use 1 test strip to check glucose 4 times daily and as needed for diabetes E11.65   insulin  NPH Human (NOVOLIN  N) 100 UNIT/ML injection Inject 0.28 mLs (28 Units total) into the skin 2 (two) times daily before a meal.   insulin   regular (HUMULIN R ) 100 units/mL injection INJECT INTO SKIN BEFORE MEALS ACCORDING TO SLIDING SCALE - MAX DOSE 48 UNITS IN 24 HOURS   methadone  (DOLOPHINE ) 1 MG/1ML solution Take 100 mg by mouth daily. Methadone  clinic   metoCLOPramide  (REGLAN ) 10 MG tablet TAKE 1 TABLET BY MOUTH TWICE DAILY AS NEEDED FOR NAUSEA OR VOMITING   pantoprazole  (PROTONIX ) 40 MG tablet Take 1 tablet (40 mg total) by mouth daily.   sodium bicarbonate 650 MG tablet Take 650 mg by mouth 2 (two) times daily.   No facility-administered encounter medications on file as of 08/07/2024.    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)    CKD (chronic kidney disease), stage IV (HCC)    Diabetic Charcot foot (HCC)    GERD (gastroesophageal reflux disease)    Hepatitis C  History of kidney stones    Hypertension    MRSA infection    in blood per patient   Nausea & vomiting 02/14/2022   Polysubstance abuse (HCC)    a.) THC + cocaine + TCA + IVDU   Secondary hyperparathyroidism    Substance abuse (HCC)    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)     Family History: Family History  Problem Relation Age of Onset   Heart disease Mother    Hypertension Mother    Obesity Mother    Gout Mother    Thyroid disease Mother    High blood pressure Maternal Grandmother    Dementia Maternal Grandmother    Heart disease Maternal Grandfather     Social History   Socioeconomic History   Marital status:  Media Planner    Spouse name: Not on file   Number of children: Not on file   Years of education: Not on file   Highest education level: Not on file  Occupational History   Not on file  Tobacco Use   Smoking status: Every Day    Current packs/day: 1.00    Average packs/day: 1 pack/day for 21.9 years (21.9 ttl pk-yrs)    Types: Cigarettes    Start date: 08/30/2002   Smokeless tobacco: Never   Tobacco comments:    1/2 pack daily.  Vaping Use   Vaping status: Former  Substance and Sexual Activity   Alcohol use: Not Currently   Drug use: Yes    Types: Marijuana, Cocaine, Heroin    Comment: last stated cocaine use ; marijuana daily   Sexual activity: Yes    Partners: Female  Other Topics Concern   Not on file  Social History Narrative   Not on file   Social Drivers of Health   Financial Resource Strain: Low Risk  (03/01/2018)   Received from [facility]   Overall Financial Resource Strain (CARDIA)    Difficulty of Paying Living Expenses: Not hard at all  Food Insecurity: No Food Insecurity (01/21/2023)   Hunger Vital Sign    Worried About Running Out of Food in the Last Year: Never true    Ran Out of Food in the Last Year: Never true  Transportation Needs: No Transportation Needs (01/21/2023)   PRAPARE - Administrator, Civil Service (Medical): No    Lack of Transportation (Non-Medical): No  Physical Activity: Inactive (03/01/2018)   Received from [facility]   Exercise Vital Sign    Days of Exercise per Week: 0 days    Minutes of Exercise per Session: 0 min  Stress: Stress Concern Present (03/01/2018)   Received from [facility] - Occupational Stress Questionnaire    Feeling of Stress : Rather much  Social Connections: Unknown (03/01/2018)   Received from [facility]   Social Connection and Isolation Panel    Frequency of Communication with Friends  and Family: Three times a week    Frequency of Social Gatherings with Friends and Family: Three times a week    Attends Religious Services: Never    Active Member of Clubs or Organizations: No    Attends Banker Meetings: Never    Marital Status: Not on file  Intimate Partner Violence: Not At Risk (01/21/2023)   Humiliation, Afraid, Rape, and Kick questionnaire    Fear of Current or Ex-Partner: No  Emotionally Abused: No    Physically Abused: No    Sexually Abused: No      Review of Systems  Constitutional:  Negative for fatigue and fever.  HENT:  Negative for congestion, mouth sores and postnasal drip.   Respiratory:  Negative for cough.   Cardiovascular:  Negative for chest pain.  Genitourinary:  Negative for flank pain.  Psychiatric/Behavioral: Negative.      Vital Signs: BP 138/80   Pulse 69   Temp 98 F (36.7 C)   Resp 16   Ht 5' 8 (1.727 m)   Wt 218 lb (98.9 kg)   SpO2 96%   BMI 33.15 kg/m    Physical Exam Musculoskeletal:        General: Swelling present.  Skin:    Findings: Erythema present.        Assessment/Plan: 1. Type 1 diabetes mellitus with stage 4 chronic kidney disease (HCC) (Primary) Uncontrolled, hg A1c 8.0 - Urine Microalbumin w/creat. ratio  2. Callus of foot Not too convinced that this is just cellulitis, will get uric acid level on next visit, might need baseline vascular studies, ABI etc  - Ambulatory referral to Podiatry  3. Cellulitis of toe of right foot Pt was advised to see podiatry asap, might need to see vascular as well - doxycycline  (VIBRA -TABS) 100 MG tablet; Take 1 tablet (100 mg total) by mouth 2 (two) times daily.  Dispense: 20 tablet; Refill: 1 - Ambulatory referral to Podiatry   General Counseling: Toribio oakland understanding of the findings of todays visit and agrees with plan of treatment. I have discussed any further diagnostic evaluation that may be needed or ordered today. We also reviewed  his medications today. he has been encouraged to call the office with any questions or concerns that should arise related to todays visit.    Orders Placed This Encounter  Procedures   Urine Microalbumin w/creat. ratio   Ambulatory referral to Podiatry    Meds ordered this encounter  Medications   doxycycline  (VIBRA -TABS) 100 MG tablet    Sig: Take 1 tablet (100 mg total) by mouth 2 (two) times daily.    Dispense:  20 tablet    Refill:  1    Total time spent:35 Minutes Time spent includes review of chart, medications, test results, and follow up plan with the patient.   [place] Controlled Substance Database was reviewed by me.   Dr Alaiyah Bollman M [facility] Internal medicine

## 2024-08-08 LAB — MICROALBUMIN / CREATININE URINE RATIO
Creatinine, Urine: 65.2 mg/dL
Microalb/Creat Ratio: 889 mg/g{creat} — ABNORMAL HIGH (ref 0–29)
Microalbumin, Urine: 579.9 ug/mL

## 2024-08-10 ENCOUNTER — Ambulatory Visit: Admission: RE | Admit: 2024-08-10 | Discharge: 2024-08-10 | Payer: MEDICAID | Attending: Nurse Practitioner

## 2024-08-10 ENCOUNTER — Other Ambulatory Visit (INDEPENDENT_AMBULATORY_CARE_PROVIDER_SITE_OTHER): Payer: Self-pay | Admitting: Nurse Practitioner

## 2024-08-10 ENCOUNTER — Telehealth: Payer: Self-pay | Admitting: Nurse Practitioner

## 2024-08-10 DIAGNOSIS — N184 Chronic kidney disease, stage 4 (severe): Secondary | ICD-10-CM

## 2024-08-10 DIAGNOSIS — M86372 Chronic multifocal osteomyelitis, left ankle and foot: Secondary | ICD-10-CM

## 2024-08-10 DIAGNOSIS — E1159 Type 2 diabetes mellitus with other circulatory complications: Secondary | ICD-10-CM

## 2024-08-10 NOTE — Telephone Encounter (Signed)
 Endocrinology referral sent via [facility] to [facility]. Notified patient. Gave patient telephone # [phone]

## 2024-08-14 ENCOUNTER — Ambulatory Visit (INDEPENDENT_AMBULATORY_CARE_PROVIDER_SITE_OTHER): Payer: Self-pay | Admitting: Nurse Practitioner

## 2024-08-14 NOTE — Progress Notes (Signed)
 F/u to see JD

## 2024-08-16 ENCOUNTER — Ambulatory Visit: Payer: MEDICAID

## 2024-08-16 DIAGNOSIS — M869 Osteomyelitis, unspecified: Secondary | ICD-10-CM | POA: Diagnosis not present

## 2024-08-16 DIAGNOSIS — L089 Local infection of the skin and subcutaneous tissue, unspecified: Secondary | ICD-10-CM

## 2024-08-16 DIAGNOSIS — E1042 Type 1 diabetes mellitus with diabetic polyneuropathy: Secondary | ICD-10-CM

## 2024-08-16 DIAGNOSIS — E10628 Type 1 diabetes mellitus with other skin complications: Secondary | ICD-10-CM | POA: Diagnosis not present

## 2024-08-16 DIAGNOSIS — M86172 Other acute osteomyelitis, left ankle and foot: Secondary | ICD-10-CM | POA: Diagnosis not present

## 2024-08-16 DIAGNOSIS — M14672 Charcot's joint, left ankle and foot: Secondary | ICD-10-CM | POA: Diagnosis not present

## 2024-08-16 NOTE — Progress Notes (Unsigned)
 Subjective:  Patient ID: Jason Francis, male    DOB: 1990/12/23,  MRN: [id number]  Chief Complaint  Patient presents with   Callouses    Callus of foot Cellulitis of toe of right foot    Discussed the use of AI scribe software for clinical note transcription with the patient, who gave verbal consent to proceed.  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  He developed swelling and erythema of the right great toe at the site of a callus that ruptured and became infected. Despite starting doxycycline , the toe stayed swollen and erythematous. Earlier today the callus ruptured again with purulent drainage described as white, green, and brown mixed with blood, prompting urgent evaluation for worsening infection.  He has minimal pain in the toe. He ambulates with crutches due to left ankle and foot Charcot-related bone deterioration and is worried about possible BKA and how this would affect his mobility given his existing right foot condition.  He is taking doxycycline  and has a refill for ten more days. He has had insurance difficulty obtaining MRI studies in the past and is scheduled to review a right foot MRI with another provider. He prefers to avoid hospitalization over the holidays because of recent bereavement and limited family support.     Review of Systems: Negative except as noted in the HPI. Denies N/V/F/Ch.  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)    CKD (chronic kidney disease), stage IV (HCC)    Diabetic Charcot foot (HCC)    GERD (gastroesophageal reflux disease)    Hepatitis C    History of kidney stones    Hypertension    MRSA infection    in blood per patient   Nausea & vomiting 02/14/2022    Polysubstance abuse (HCC)    a.) THC + cocaine + TCA + IVDU   Secondary hyperparathyroidism    Substance abuse (HCC)    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)    Current Medications[1]  Tobacco Use History[2]  Allergies[3] Objective:  {[id number]}   Radiographs: Taken and reviewed. ***   Physical Exam EXTREMITIES: Right big toe with purulent drainage and callus buildup. Good pulse in right foot.   Right foot radiographs: 3 views of the right foot were taken today. They were mistakenly labeled as left foot on the order. These demonstrate osseous cortical erosions at the medial aspect of the hallux distal phalanx base and proximal phalanx head. Remote, chronic appearing posterior talus fracture.    Assessment:   1. Osteomyelitis of foot, left, acute (HCC)      Plan:  Patient was evaluated and treated and all questions answered.  Assessment and Plan Assessment & Plan Acute osteomyelitis of the right hallux Acute osteomyelitis of the right hallux with bone infection, complicated by diabetes and stage 4 CKD. X-ray shows cortical erosions, suggesting advanced infection. Surgical amputation of the hallux is likely necessary due to high risk of progression and low success rate of IV antibiotics. - No purulence at examination today to be cultured. Wound debrided to granular, bleeding base. Silver nitrate applied for hemostasis.  - Recommended MRI of the right foot to confirm extent of osteomyelitis and help to differentiate from  Charcot changes. - Advised continuation of doxycycline  as previously prescribed prior to going to ED.  - Applied triple antibiotic ointment and bandaid to gauze. - Recommended urgent evaluation in the emergency department for expedited MRI, infectious disease consultation, and amputation of hallux if indicated. Recommend ESR, CRP, WBC, CMP - Advised close monitoring for worsening symptoms and to seek immediate care if condition  deteriorates.  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  - Recommended continued use of crutches and/or knee scooter as needed for mobility. - Patient being evaluated for likely BKA      No follow-ups on file.   Prentice Ovens, DPM AACFAS Fellowship Trained Podiatric Surgeon [facility]     [1]  Current Outpatient Medications:    Accu-Chek Softclix Lancets lancets, Use 1 lancet to check glucose 4 times daily and as needed for diabetes E11.65, Disp: 200 each, Rfl: 12   acetaminophen  (TYLENOL ) 500 MG tablet, Take 1,000 mg by mouth every 6 (six) hours as needed., Disp: , Rfl:    amLODipine  (NORVASC ) 10 MG tablet, Take 10 mg by mouth every evening., Disp: , Rfl:    Blood Glucose Monitoring Suppl (ACCU-CHEK GUIDE ME) w/Device KIT, USE AS DIRECTED, Disp: 1 kit, Rfl: 0   calcitRIOL (ROCALTROL) 0.25 MCG capsule, Take 0.25 mcg by mouth daily., Disp: , Rfl:    carvedilol (COREG) 6.25 MG tablet, Take 6.25 mg by mouth 2 (two) times daily with a meal., Disp: , Rfl:    cloNIDine (CATAPRES) 0.1 MG tablet, Take 0.1 mg by mouth 2 (two) times daily., Disp: , Rfl:    cyclobenzaprine  (FLEXERIL ) 10 MG tablet, Take 1 tablet (10 mg total) by mouth 2 (two) times daily as needed for muscle spasms., Disp: 60 tablet, Rfl: 3   doxycycline  (VIBRA -TABS) 100 MG tablet, Take 1 tablet (100 mg total) by mouth 2 (two) times daily., Disp: 20 tablet, Rfl: 1   Ferrous Sulfate (IRON ) 28 MG TABS, Take 2 tablets by mouth daily., Disp: , Rfl:    glucose blood (ACCU-CHEK GUIDE TEST) test strip, Use 1 test strip to check glucose 4 times daily and as needed for diabetes E11.65, Disp: 200 each, Rfl: 12   insulin  NPH Human (NOVOLIN  N) 100 UNIT/ML injection, Inject 0.28 mLs (28 Units total) into the skin 2 (two) times daily before a meal., Disp: 30 mL, Rfl: 11   insulin  regular (HUMULIN R ) 100 units/mL injection, INJECT INTO SKIN  BEFORE MEALS ACCORDING TO SLIDING SCALE - MAX DOSE 48 UNITS IN 24 HOURS, Disp: 20 mL, Rfl: 6   methadone  (DOLOPHINE ) 1 MG/1ML solution, Take 100 mg by mouth daily. Methadone  clinic, Disp: , Rfl:    metoCLOPramide  (REGLAN ) 10 MG tablet, TAKE 1 TABLET BY MOUTH TWICE DAILY AS NEEDED FOR NAUSEA OR VOMITING, Disp: 180 tablet, Rfl: 0   pantoprazole  (PROTONIX ) 40 MG tablet, Take 1 tablet (40 mg total) by mouth daily., Disp: 90 tablet, Rfl: 1   sodium bicarbonate 650 MG tablet, Take 650 mg by mouth 2 (two) times daily., Disp: , Rfl:  [2]  Social History Tobacco Use  Smoking Status Every Day   Current packs/day: 1.00   Average packs/day: 1 pack/day for 22.0 years (22.0 ttl pk-yrs)   Types: Cigarettes   Start date: 08/30/2002  Smokeless Tobacco Never  Tobacco Comments   1/2 pack daily.  [3]  Allergies Allergen Reactions   Augmentin [Amoxicillin-Pot Clavulanate] Nausea And Vomiting   Sulfa  Antibiotics Nausea And Vomiting

## 2024-08-17 ENCOUNTER — Encounter (INDEPENDENT_AMBULATORY_CARE_PROVIDER_SITE_OTHER): Payer: Self-pay | Admitting: Vascular Surgery

## 2024-08-17 ENCOUNTER — Ambulatory Visit (INDEPENDENT_AMBULATORY_CARE_PROVIDER_SITE_OTHER): Payer: MEDICAID | Admitting: Vascular Surgery

## 2024-08-17 VITALS — BP 144/82 | HR 78 | Resp 17 | Ht 68.0 in | Wt 123.8 lb

## 2024-08-17 DIAGNOSIS — E10628 Type 1 diabetes mellitus with other skin complications: Secondary | ICD-10-CM | POA: Diagnosis not present

## 2024-08-17 DIAGNOSIS — L089 Local infection of the skin and subcutaneous tissue, unspecified: Secondary | ICD-10-CM

## 2024-08-17 DIAGNOSIS — E1042 Type 1 diabetes mellitus with diabetic polyneuropathy: Secondary | ICD-10-CM

## 2024-08-17 DIAGNOSIS — N184 Chronic kidney disease, stage 4 (severe): Secondary | ICD-10-CM

## 2024-08-17 DIAGNOSIS — M86372 Chronic multifocal osteomyelitis, left ankle and foot: Secondary | ICD-10-CM

## 2024-08-17 NOTE — Progress Notes (Signed)
 "   MRN : [id number]  Jason Francis is a 33 y.o. (24-Mar-1991) male who presents with chief complaint of  Chief Complaint  Patient presents with   Follow-up  .  History of Present Illness:   Discussed the use of AI scribe software for clinical note transcription with the patient, who gave verbal consent to proceed.  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  He has many ongoing active issues but the most pressing appears to be a new infection of the right great toe.  He saw podiatry and they are recommending amputation due to the infection.  He developed an acute right toe infection after an infected callus led to toe splitting with purulent drainage. He was started on doxycycline  and was urgently evaluated by podiatry on August 16, 2024.   Podiatry recommended expedited hospital admission for possible right toe amputation due to anticipated insurance delays with elective scheduling. He was counseled that prolonged IV antibiotics may be harmful to his kidneys.  He has longstanding left foot and ankle disease with chronic multifocal osteomyelitis and loss of the ankle bone, causing major mobility impairment. He ambulates with difficulty and may need a wheelchair for longer distances. He has previously considered left below-knee amputation.  He has undergone an MRI study which I have independently reviewed earlier this month.  The official report is listed below but there fluid collection and findings consistent for chronic infection of the left foot and ankle with osteomyelitis.  Possible this could be chronic reactive inflammation as well but with the fluid collection I am concerned this is osteomyelitis.  He has stage 4 chronic kidney disease, and there is concern  that aggressive antibiotic therapy could worsen renal function. He understands the likely need for future dialysis and vascular access. He is followed by nephrology, and his most recent kidney function was reportedly stable.  He does not have any superficial veins for fistula creation at this point due to previous IV drug use.  We are holding off on graft placement until he is cleared of infection as well as nearer to dialysis due to the decreased durability of graft versus fistulas.    Results Radiology Left foot MRI: Findings concerning for infection, including reactive inflammation versus osteomyelitis and fluid collection suggestive of infection; absence of ankle bone noted. (Independently interpreted)  Current Outpatient Medications  Medication Sig Dispense Refill   Accu-Chek Softclix Lancets lancets Use 1 lancet to check glucose 4 times daily and as needed for diabetes E11.65 200 each 12   acetaminophen  (TYLENOL ) 500 MG tablet Take 1,000 mg by mouth every 6 (six) hours as needed.     amLODipine  (NORVASC ) 10 MG tablet Take 10 mg by mouth every evening.     Blood Glucose Monitoring Suppl (ACCU-CHEK GUIDE ME) w/Device KIT USE AS DIRECTED 1 kit 0   calcitRIOL (ROCALTROL) 0.25 MCG capsule Take 0.25 mcg by mouth daily.     carvedilol (COREG) 6.25 MG tablet Take 6.25 mg by mouth 2 (two) times daily with a meal.     cloNIDine (CATAPRES) 0.1 MG tablet Take 0.1 mg by mouth 2 (two) times daily.     cyclobenzaprine  (FLEXERIL ) 10 MG tablet Take 1 tablet (10 mg total) by mouth 2 (two) times daily as needed for muscle spasms.  60 tablet 3   doxycycline  (VIBRA -TABS) 100 MG tablet Take 1 tablet (100 mg total) by mouth 2 (two) times daily. 20 tablet 1   Ferrous Sulfate (IRON ) 28 MG TABS Take 2 tablets by mouth daily.     glucose blood (ACCU-CHEK GUIDE TEST) test strip Use 1 test strip to check glucose 4 times daily and as needed for diabetes E11.65 200 each 12   insulin  NPH Human (NOVOLIN  N) 100 UNIT/ML  injection Inject 0.28 mLs (28 Units total) into the skin 2 (two) times daily before a meal. 30 mL 11   insulin  regular (HUMULIN R ) 100 units/mL injection INJECT INTO SKIN BEFORE MEALS ACCORDING TO SLIDING SCALE - MAX DOSE 48 UNITS IN 24 HOURS 20 mL 6   methadone  (DOLOPHINE ) 1 MG/1ML solution Take 100 mg by mouth daily. Methadone  clinic     metoCLOPramide  (REGLAN ) 10 MG tablet TAKE 1 TABLET BY MOUTH TWICE DAILY AS NEEDED FOR NAUSEA OR VOMITING 180 tablet 0   pantoprazole  (PROTONIX ) 40 MG tablet Take 1 tablet (40 mg total) by mouth daily. 90 tablet 1   sodium bicarbonate 650 MG tablet Take 650 mg by mouth 2 (two) times daily.     No current facility-administered medications for this visit.    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)    CKD (chronic kidney disease), stage IV (HCC)    Diabetic Charcot foot (HCC)    GERD (gastroesophageal reflux disease)    Hepatitis C    History of kidney stones    Hypertension    MRSA infection    in blood per patient   Nausea & vomiting 02/14/2022   Polysubstance abuse (HCC)    a.) THC + cocaine + TCA + IVDU   Secondary hyperparathyroidism    Substance abuse (HCC)    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)     Past Surgical History:  Procedure Laterality Date   APPENDECTOMY     AV FISTULA PLACEMENT Left 11/17/2023   Procedure: ARTERIOVENOUS (AV) FISTULA CREATION;  Surgeon: Marea Selinda RAMAN, MD;  Location: [facility] ORS;  Service: Vascular;  Laterality: Left;   CYSTOSCOPY, WITH RETROGRADE PYELOGRAM, URETEROSCOPY, URINARY CALCULUS LASER LITHOTRIPSY, AND STENT INSERT     FOOT SURGERY Left 2019   MYRINGOTOMY       Social History[1]    Family History  Problem Relation Age of Onset   Heart disease Mother    Hypertension Mother    Obesity Mother    Gout Mother    Thyroid disease Mother    High blood pressure Maternal Grandmother    Dementia Maternal  Grandmother    Heart disease Maternal Grandfather      Allergies[2]   REVIEW OF SYSTEMS (Negative unless checked)  Constitutional: [] Weight loss  [] Fever  [] Chills Cardiac: [] Chest pain   [] Chest pressure   [] Palpitations   [] Shortness of breath when laying flat   [] Shortness of breath at rest   [] Shortness of breath with exertion. Vascular:  [x] Pain in legs with walking   [x] Pain in legs at rest   [] Pain in legs when laying flat   [] Claudication   [] Pain in feet when walking  [] Pain in feet at rest  [] Pain in feet when laying flat   [] History of DVT   [] Phlebitis   [] Swelling in legs   [] Varicose veins   [x] Non-healing ulcers Pulmonary:   [] Uses home oxygen   []   Productive cough   [] Hemoptysis   [] Wheeze  [] COPD   [] Asthma Neurologic:  [] Dizziness  [] Blackouts   [] Seizures   [] History of stroke   [] History of TIA  [] Aphasia   [] Temporary blindness   [] Dysphagia   [] Weakness or numbness in arms   [] Weakness or numbness in legs Musculoskeletal:  [x] Arthritis   [] Joint swelling   [x] Joint pain   [] Low back pain Hematologic:  [] Easy bruising  [] Easy bleeding   [] Hypercoagulable state   [x] Anemic   Gastrointestinal:  [] Blood in stool   [] Vomiting blood  [] Gastroesophageal reflux/heartburn   [] Abdominal pain Genitourinary:  [x] Chronic kidney disease   [] Difficult urination  [] Frequent urination  [] Burning with urination   [] Hematuria Skin:  [] Rashes   [x] Ulcers   [x] Wounds Psychological:  [] History of anxiety   []  History of major depression.  Physical Examination  BP (!) 144/82   Pulse 78   Resp 17   Ht 5' 8 (1.727 m)   Wt 123 lb 12.8 oz (56.2 kg)   BMI 18.82 kg/m  Gen:  WD/WN, NAD.  Appears older than stated age Head: [place]/AT, No temporalis wasting. Ear/Nose/Throat: Hearing grossly intact, nares w/o erythema or drainage Eyes: Conjunctiva clear. Sclera non-icteric Neck: Supple.  Trachea midline Pulmonary:  Good air movement, no use of accessory muscles.  Cardiac: RRR, no JVD Vascular:   Vessel Right Left  Radial Palpable Palpable           Musculoskeletal: M/S 5/5 throughout.  Severe Charcot deformity of the left foot and ankle.  Walks with crutches. Neurologic: Sensation grossly intact in extremities.  Symmetrical.  Speech is fluent.  Psychiatric: Judgment intact, Mood & affect appropriate for pt's clinical situation. Dermatologic: Right toe ulceration is currently dressed.  Physical Exam     Labs Recent Results (from the past 2160 hours)  POCT glycosylated hemoglobin (Hb A1C)     Status: Abnormal   Collection Time: 07/13/24  9:24 AM  Result Value Ref Range   Hemoglobin A1C 8.0 (A) 4.0 - 5.6 %   HbA1c POC (<> result, manual entry)     HbA1c, POC (prediabetic range)     HbA1c, POC (controlled diabetic range)    Urine Microalbumin w/creat. ratio     Status: Abnormal   Collection Time: 08/07/24 10:35 AM  Result Value Ref Range   Creatinine, Urine 65.2 Not Estab. mg/dL   Microalbumin, Urine 420.0 Not Estab. ug/mL    Comment: Results confirmed on dilution.    Microalb/Creat Ratio 889 (H) 0 - 29 mg/g creat    Comment:                        Normal:                0 -  29                        Moderately increased: 30 - 300                        Severely increased:       >300     Radiology MR ANKLE LEFT WO CONTRAST Result Date: 08/13/2024 CLINICAL DATA:  Left ankle swelling, chronic deterioration EXAM: MRI OF THE LEFT ANKLE WITHOUT CONTRAST TECHNIQUE: Multiplanar, multisequence MR imaging of the ankle was performed. No intravenous contrast was administered. COMPARISON:  Ankle x-ray 07/10/2024 FINDINGS: Bones/Joint/Cartilage Diffuse advanced arthropathy of the ankle in foot with  fragmentation of the midfoot and resorption of the distal tibia and fibula consistent with neuropathic arthropathy. Mild marrow edema in the distal tibia and fibula which may be reactive versus secondary to osteomyelitis. Large amount of complex fluid in the ankle joint with severe  synovitis. Muscles and Tendons Mild muscle atrophy. Flexor, extensor, peroneal and Achilles tendons are intact. Soft tissue No fluid collection or hematoma.  No soft tissue mass. IMPRESSION: 1. Diffuse advanced arthropathy of the ankle in foot with fragmentation of the midfoot and resorption of the distal tibia and fibula consistent with neuropathic arthropathy. Mild marrow edema in the distal tibia and fibula which may be reactive versus secondary to osteomyelitis. 2. Large amount of complex fluid in the ankle joint with severe synovitis which may be inflammatory, but infectious etiology is not excluded. Recommend fluid sampling for further evaluation. Electronically Signed   By: Julaine Blanch M.D.   On: 08/13/2024 14:05    Assessment/Plan    Assessment & Plan Chronic multifocal osteomyelitis of left ankle and foot Chronic multifocal osteomyelitis with chronic Charcot changes of the left foot and ankle. Imaging shows likely active osteomyelitis and fluid collection. Severe infection requires intervention. Chronic Charcot foot and prior infections complicate surgical planning. Prolonged IV antibiotics pose renal risk. - Urgent right toe amputation by podiatry recommended. - His podiatrist advised emergency department presentation for expedited surgical intervention. - Delayed vascular graft placement until infection control and post-amputation rehabilitation. - Recommended several weeks of rehabilitation post-toe amputation for mobility before further surgeries with left below-knee amputation planned to resolve his likely chronic osteomyelitis of the left foot and ankle with severe Charcot deformity.  Patient already cannot walk on the foot without crutches and would likely be more functional with below-knee amputation with prosthesis.SABRA - Planned reassessment post-toe amputation for timing of potential left below-knee amputation and vascular access procedures.  Chronic kidney disease, stage 4 Stage 4  CKD impacts infection management and surgical planning. Aggressive antibiotics could harm renal function. Vascular access timing depends on renal stability and infection resolution. - Deferred vascular graft placement until infection resolution and surgical interventions completion. - Coordinated with nephrology to monitor renal function and determine timing for vascular access, ideally 2-3 weeks before dialysis. - Discussed avoiding nephrotoxic antibiotics to minimize renal decline risk.  Diabetic polyneuropathy associated with type 1 diabetes mellitus (HCC) blood glucose control important in reducing the progression of atherosclerotic disease. Also, involved in wound healing. On appropriate medications.   Type 1 diabetes mellitus with diabetic foot infection (HCC) Active right toe infection.  Seen by podiatry who is recommending toe amputation.  This is being planned in the near future.   The various different issues that the patient has ongoing that are interwoven but not immediately causal certainly complicates the situation.  In my estimation, having his toe amputation first as this is the most acute active infection makes the most sense.  I would plan a left below-knee amputation before we consider any AV graft placement for dialysis.  In a perfect world, I would like to give him several weeks of rehabilitation from his toe amputation to help with mobility before doing a left below-knee amputation.  This was all discussed with the patient and his significant other.   Selinda Gu, MD  08/17/2024 9:33 AM    This note was created with Dragon medical transcription system.  Any errors from dictation are purely unintentional     [1]  Social History Tobacco Use   Smoking status: Every Day  Current packs/day: 1.00    Average packs/day: 1 pack/day for 22.0 years (22.0 ttl pk-yrs)    Types: Cigarettes    Start date: 08/30/2002   Smokeless tobacco: Never   Tobacco comments:    1/2 pack  daily.  Vaping Use   Vaping status: Former  Substance Use Topics   Alcohol use: Not Currently   Drug use: Yes    Types: Marijuana, Cocaine, Heroin    Comment: last stated cocaine use ; marijuana daily  [2]  Allergies Allergen Reactions   Augmentin [Amoxicillin-Pot Clavulanate] Nausea And Vomiting   Sulfa Antibiotics Nausea And Vomiting   "

## 2024-08-17 NOTE — Assessment & Plan Note (Signed)
 blood glucose control important in reducing the progression of atherosclerotic disease. Also, involved in wound healing. On appropriate medications.

## 2024-08-17 NOTE — Assessment & Plan Note (Signed)
 Active right toe infection.  Seen by podiatry who is recommending toe amputation.  This is being planned in the near future.

## 2024-08-21 ENCOUNTER — Encounter: Payer: Self-pay | Admitting: Nurse Practitioner

## 2024-08-28 ENCOUNTER — Other Ambulatory Visit: Payer: Self-pay

## 2024-08-28 ENCOUNTER — Emergency Department: Payer: MEDICAID

## 2024-08-28 ENCOUNTER — Inpatient Hospital Stay: Payer: MEDICAID

## 2024-08-28 ENCOUNTER — Inpatient Hospital Stay
Admission: EM | Admit: 2024-08-28 | Discharge: 2024-08-30 | DRG: 617 | Disposition: A | Discharge status: Home Health | Payer: MEDICAID | Attending: Internal Medicine | Admitting: Internal Medicine

## 2024-08-28 DIAGNOSIS — D631 Anemia in chronic kidney disease: Secondary | ICD-10-CM | POA: Diagnosis present

## 2024-08-28 DIAGNOSIS — E1022 Type 1 diabetes mellitus with diabetic chronic kidney disease: Secondary | ICD-10-CM | POA: Diagnosis present

## 2024-08-28 DIAGNOSIS — M009 Pyogenic arthritis, unspecified: Secondary | ICD-10-CM | POA: Diagnosis present

## 2024-08-28 DIAGNOSIS — I10 Essential (primary) hypertension: Secondary | ICD-10-CM | POA: Diagnosis not present

## 2024-08-28 DIAGNOSIS — Z79899 Other long term (current) drug therapy: Secondary | ICD-10-CM

## 2024-08-28 DIAGNOSIS — E1169 Type 2 diabetes mellitus with other specified complication: Secondary | ICD-10-CM

## 2024-08-28 DIAGNOSIS — E10610 Type 1 diabetes mellitus with diabetic neuropathic arthropathy: Secondary | ICD-10-CM | POA: Diagnosis present

## 2024-08-28 DIAGNOSIS — M62838 Other muscle spasm: Secondary | ICD-10-CM

## 2024-08-28 DIAGNOSIS — M869 Osteomyelitis, unspecified: Principal | ICD-10-CM | POA: Diagnosis present

## 2024-08-28 DIAGNOSIS — M21962 Unspecified acquired deformity of left lower leg: Secondary | ICD-10-CM | POA: Diagnosis present

## 2024-08-28 DIAGNOSIS — F17210 Nicotine dependence, cigarettes, uncomplicated: Secondary | ICD-10-CM | POA: Diagnosis present

## 2024-08-28 DIAGNOSIS — E66811 Obesity, class 1: Secondary | ICD-10-CM | POA: Diagnosis present

## 2024-08-28 DIAGNOSIS — Z6832 Body mass index (BMI) 32.0-32.9, adult: Secondary | ICD-10-CM | POA: Diagnosis not present

## 2024-08-28 DIAGNOSIS — E669 Obesity, unspecified: Secondary | ICD-10-CM

## 2024-08-28 DIAGNOSIS — E1122 Type 2 diabetes mellitus with diabetic chronic kidney disease: Secondary | ICD-10-CM

## 2024-08-28 DIAGNOSIS — Z8249 Family history of ischemic heart disease and other diseases of the circulatory system: Secondary | ICD-10-CM | POA: Diagnosis not present

## 2024-08-28 DIAGNOSIS — F1190 Opioid use, unspecified, uncomplicated: Secondary | ICD-10-CM | POA: Diagnosis present

## 2024-08-28 DIAGNOSIS — L97519 Non-pressure chronic ulcer of other part of right foot with unspecified severity: Secondary | ICD-10-CM | POA: Diagnosis present

## 2024-08-28 DIAGNOSIS — B1920 Unspecified viral hepatitis C without hepatic coma: Secondary | ICD-10-CM | POA: Diagnosis present

## 2024-08-28 DIAGNOSIS — E875 Hyperkalemia: Secondary | ICD-10-CM | POA: Diagnosis present

## 2024-08-28 DIAGNOSIS — D638 Anemia in other chronic diseases classified elsewhere: Secondary | ICD-10-CM | POA: Diagnosis present

## 2024-08-28 DIAGNOSIS — I129 Hypertensive chronic kidney disease with stage 1 through stage 4 chronic kidney disease, or unspecified chronic kidney disease: Secondary | ICD-10-CM | POA: Diagnosis present

## 2024-08-28 DIAGNOSIS — Z881 Allergy status to other antibiotic agents status: Secondary | ICD-10-CM

## 2024-08-28 DIAGNOSIS — F1290 Cannabis use, unspecified, uncomplicated: Secondary | ICD-10-CM | POA: Diagnosis present

## 2024-08-28 DIAGNOSIS — M14672 Charcot's joint, left ankle and foot: Secondary | ICD-10-CM

## 2024-08-28 DIAGNOSIS — E10621 Type 1 diabetes mellitus with foot ulcer: Secondary | ICD-10-CM | POA: Diagnosis present

## 2024-08-28 DIAGNOSIS — M86372 Chronic multifocal osteomyelitis, left ankle and foot: Secondary | ICD-10-CM | POA: Diagnosis present

## 2024-08-28 DIAGNOSIS — N2581 Secondary hyperparathyroidism of renal origin: Secondary | ICD-10-CM | POA: Diagnosis present

## 2024-08-28 DIAGNOSIS — Z794 Long term (current) use of insulin: Secondary | ICD-10-CM | POA: Diagnosis not present

## 2024-08-28 DIAGNOSIS — N179 Acute kidney failure, unspecified: Secondary | ICD-10-CM | POA: Diagnosis not present

## 2024-08-28 DIAGNOSIS — Z8614 Personal history of Methicillin resistant Staphylococcus aureus infection: Secondary | ICD-10-CM

## 2024-08-28 DIAGNOSIS — N189 Chronic kidney disease, unspecified: Secondary | ICD-10-CM | POA: Diagnosis not present

## 2024-08-28 DIAGNOSIS — E1065 Type 1 diabetes mellitus with hyperglycemia: Secondary | ICD-10-CM | POA: Diagnosis present

## 2024-08-28 DIAGNOSIS — M21612 Bunion of left foot: Secondary | ICD-10-CM | POA: Diagnosis present

## 2024-08-28 DIAGNOSIS — E871 Hypo-osmolality and hyponatremia: Secondary | ICD-10-CM | POA: Diagnosis present

## 2024-08-28 DIAGNOSIS — F1490 Cocaine use, unspecified, uncomplicated: Secondary | ICD-10-CM | POA: Diagnosis present

## 2024-08-28 DIAGNOSIS — N184 Chronic kidney disease, stage 4 (severe): Secondary | ICD-10-CM | POA: Diagnosis present

## 2024-08-28 DIAGNOSIS — L089 Local infection of the skin and subcutaneous tissue, unspecified: Secondary | ICD-10-CM | POA: Diagnosis present

## 2024-08-28 DIAGNOSIS — Z882 Allergy status to sulfonamides status: Secondary | ICD-10-CM

## 2024-08-28 DIAGNOSIS — Z79891 Long term (current) use of opiate analgesic: Secondary | ICD-10-CM

## 2024-08-28 DIAGNOSIS — E1069 Type 1 diabetes mellitus with other specified complication: Principal | ICD-10-CM | POA: Diagnosis present

## 2024-08-28 DIAGNOSIS — K219 Gastro-esophageal reflux disease without esophagitis: Secondary | ICD-10-CM | POA: Diagnosis present

## 2024-08-28 DIAGNOSIS — Z8349 Family history of other endocrine, nutritional and metabolic diseases: Secondary | ICD-10-CM

## 2024-08-28 DIAGNOSIS — R739 Hyperglycemia, unspecified: Secondary | ICD-10-CM | POA: Diagnosis present

## 2024-08-28 DIAGNOSIS — E109 Type 1 diabetes mellitus without complications: Secondary | ICD-10-CM | POA: Diagnosis present

## 2024-08-28 LAB — COMPREHENSIVE METABOLIC PANEL WITH GFR
ALT: 30 U/L (ref 0–44)
AST: 32 U/L (ref 15–41)
Albumin: 3.6 g/dL (ref 3.5–5.0)
Alkaline Phosphatase: 148 U/L — ABNORMAL HIGH (ref 38–126)
Anion gap: 13 (ref 5–15)
BUN: 70 mg/dL — ABNORMAL HIGH (ref 6–20)
CO2: 21 mmol/L — ABNORMAL LOW (ref 22–32)
Calcium: 9.1 mg/dL (ref 8.9–10.3)
Chloride: 96 mmol/L — ABNORMAL LOW (ref 98–111)
Creatinine, Ser: 4.85 mg/dL — ABNORMAL HIGH (ref 0.61–1.24)
GFR, Estimated: 15 mL/min — ABNORMAL LOW
Glucose, Bld: 346 mg/dL — ABNORMAL HIGH (ref 70–99)
Potassium: 5 mmol/L (ref 3.5–5.1)
Sodium: 130 mmol/L — ABNORMAL LOW (ref 135–145)
Total Bilirubin: 0.4 mg/dL (ref 0.0–1.2)
Total Protein: 8.1 g/dL (ref 6.5–8.1)

## 2024-08-28 LAB — CBC WITH DIFFERENTIAL/PLATELET
Abs Immature Granulocytes: 0.04 K/uL (ref 0.00–0.07)
Basophils Absolute: 0.1 K/uL (ref 0.0–0.1)
Basophils Relative: 1 %
Eosinophils Absolute: 0.5 K/uL (ref 0.0–0.5)
Eosinophils Relative: 5 %
HCT: 30.4 % — ABNORMAL LOW (ref 39.0–52.0)
Hemoglobin: 10 g/dL — ABNORMAL LOW (ref 13.0–17.0)
Immature Granulocytes: 0 %
Lymphocytes Relative: 21 %
Lymphs Abs: 2.1 K/uL (ref 0.7–4.0)
MCH: 29.1 pg (ref 26.0–34.0)
MCHC: 32.9 g/dL (ref 30.0–36.0)
MCV: 88.4 fL (ref 80.0–100.0)
Monocytes Absolute: 0.8 K/uL (ref 0.1–1.0)
Monocytes Relative: 8 %
Neutro Abs: 6.4 K/uL (ref 1.7–7.7)
Neutrophils Relative %: 65 %
Platelets: 249 K/uL (ref 150–400)
RBC: 3.44 MIL/uL — ABNORMAL LOW (ref 4.22–5.81)
RDW: 12.5 % (ref 11.5–15.5)
WBC: 9.8 K/uL (ref 4.0–10.5)
nRBC: 0 % (ref 0.0–0.2)

## 2024-08-28 LAB — C-REACTIVE PROTEIN: CRP: 2.4 mg/dL — ABNORMAL HIGH

## 2024-08-28 LAB — GLUCOSE, CAPILLARY
Glucose-Capillary: 125 mg/dL — ABNORMAL HIGH (ref 70–99)
Glucose-Capillary: 283 mg/dL — ABNORMAL HIGH (ref 70–99)

## 2024-08-28 LAB — SEDIMENTATION RATE: Sed Rate: 73 mm/h — ABNORMAL HIGH (ref 0–15)

## 2024-08-28 LAB — HEMOGLOBIN A1C
Hgb A1c MFr Bld: 7.8 % — ABNORMAL HIGH (ref 4.8–5.6)
Mean Plasma Glucose: 177.16 mg/dL

## 2024-08-28 MED ORDER — CARVEDILOL 3.125 MG PO TABS
6.2500 mg | ORAL_TABLET | Freq: Two times a day (BID) | ORAL | Status: DC
  Administered 2024-08-28 – 2024-08-30 (×3): 6.25 mg via ORAL
  Filled 2024-08-28 (×4): qty 2

## 2024-08-28 MED ORDER — GADOBUTROL 1 MMOL/ML IV SOLN
10.0000 mL | Freq: Once | INTRAVENOUS | Status: AC | PRN
  Administered 2024-08-28: 10 mL via INTRAVENOUS

## 2024-08-28 MED ORDER — ONDANSETRON HCL 4 MG/2ML IJ SOLN: 4.0000 mg | Freq: Four times a day (QID) | INTRAMUSCULAR | Status: DC | PRN

## 2024-08-28 MED ORDER — INSULIN GLARGINE 100 UNIT/ML ~~LOC~~ SOLN
25.0000 [IU] | Freq: Every day | SUBCUTANEOUS | Status: DC
  Administered 2024-08-28: 25 [IU] via SUBCUTANEOUS
  Filled 2024-08-28: qty 0.25

## 2024-08-28 MED ORDER — ONDANSETRON HCL 4 MG PO TABS: 4.0000 mg | ORAL_TABLET | Freq: Four times a day (QID) | ORAL | Status: DC | PRN

## 2024-08-28 MED ORDER — SODIUM CHLORIDE 0.9 % IV SOLN
100.0000 mg | Freq: Two times a day (BID) | INTRAVENOUS | Status: DC
  Filled 2024-08-28: qty 100

## 2024-08-28 MED ORDER — SODIUM CHLORIDE 0.9 % IV SOLN
2.0000 g | Freq: Once | INTRAVENOUS | Status: AC
  Administered 2024-08-28: 2 g via INTRAVENOUS
  Filled 2024-08-28: qty 20

## 2024-08-28 MED ORDER — VANCOMYCIN HCL IN DEXTROSE 1-5 GM/200ML-% IV SOLN
1000.0000 mg | Freq: Once | INTRAVENOUS | Status: AC
  Administered 2024-08-28: 1000 mg via INTRAVENOUS
  Filled 2024-08-28 (×2): qty 200

## 2024-08-28 MED ORDER — SODIUM CHLORIDE 0.9 % IV SOLN
2.0000 g | INTRAVENOUS | Status: DC
  Administered 2024-08-28: 2 g via INTRAVENOUS
  Filled 2024-08-28 (×2): qty 12.5

## 2024-08-28 MED ORDER — VANCOMYCIN VARIABLE DOSE PER UNSTABLE RENAL FUNCTION (PHARMACIST DOSING): Status: DC

## 2024-08-28 MED ORDER — ENOXAPARIN SODIUM 30 MG/0.3ML IJ SOSY
30.0000 mg | PREFILLED_SYRINGE | INTRAMUSCULAR | Status: DC
  Filled 2024-08-28 (×2): qty 0.3

## 2024-08-28 MED ORDER — INSULIN ASPART 100 UNIT/ML IJ SOLN
0.0000 [IU] | Freq: Three times a day (TID) | INTRAMUSCULAR | Status: DC
  Administered 2024-08-28: 6 [IU] via SUBCUTANEOUS
  Filled 2024-08-28: qty 6
  Filled 2024-08-28: qty 7

## 2024-08-28 MED ORDER — LINAGLIPTIN 5 MG PO TABS
5.0000 mg | ORAL_TABLET | Freq: Every day | ORAL | Status: DC
  Filled 2024-08-28 (×3): qty 1

## 2024-08-28 MED ORDER — METHADONE HCL 10 MG/ML PO CONC
100.0000 mg | Freq: Every day | ORAL | Status: DC
  Administered 2024-08-29: 100 mg via ORAL
  Filled 2024-08-28 (×2): qty 10

## 2024-08-28 MED ORDER — PANTOPRAZOLE SODIUM 40 MG PO TBEC
40.0000 mg | DELAYED_RELEASE_TABLET | Freq: Every day | ORAL | Status: DC
  Administered 2024-08-29 – 2024-08-30 (×2): 40 mg via ORAL
  Filled 2024-08-28 (×2): qty 1

## 2024-08-28 MED ORDER — METRONIDAZOLE 500 MG/100ML IV SOLN
500.0000 mg | Freq: Once | INTRAVENOUS | Status: AC
  Administered 2024-08-28: 500 mg via INTRAVENOUS
  Filled 2024-08-28: qty 100

## 2024-08-28 MED ORDER — CLONIDINE HCL 0.1 MG PO TABS
0.1000 mg | ORAL_TABLET | Freq: Two times a day (BID) | ORAL | Status: DC
  Administered 2024-08-28 – 2024-08-30 (×4): 0.1 mg via ORAL
  Filled 2024-08-28 (×5): qty 1

## 2024-08-28 MED ORDER — ENOXAPARIN SODIUM 40 MG/0.4ML IJ SOSY: 40.0000 mg | PREFILLED_SYRINGE | INTRAMUSCULAR | Status: DC

## 2024-08-28 MED ORDER — INSULIN ASPART 100 UNIT/ML IJ SOLN
6.0000 [IU] | Freq: Three times a day (TID) | INTRAMUSCULAR | Status: DC
  Filled 2024-08-28: qty 6

## 2024-08-28 MED ORDER — ACETAMINOPHEN 650 MG RE SUPP: 650.0000 mg | Freq: Four times a day (QID) | RECTAL | Status: DC | PRN

## 2024-08-28 MED ORDER — VANCOMYCIN HCL IN DEXTROSE 1-5 GM/200ML-% IV SOLN
1000.0000 mg | Freq: Once | INTRAVENOUS | Status: AC
  Administered 2024-08-28: 1000 mg via INTRAVENOUS
  Filled 2024-08-28: qty 200

## 2024-08-28 MED ORDER — HYDROCODONE-ACETAMINOPHEN 5-325 MG PO TABS
1.0000 | ORAL_TABLET | ORAL | Status: DC | PRN
  Administered 2024-08-29: 1 via ORAL
  Filled 2024-08-28: qty 1

## 2024-08-28 MED ORDER — SODIUM CHLORIDE 0.9 % IV SOLN: 2.0000 g | INTRAVENOUS | Status: DC

## 2024-08-28 MED ORDER — CALCITRIOL 0.25 MCG PO CAPS
0.2500 ug | ORAL_CAPSULE | Freq: Every day | ORAL | Status: DC
  Administered 2024-08-29 – 2024-08-30 (×2): 0.25 ug via ORAL
  Filled 2024-08-28 (×2): qty 1

## 2024-08-28 MED ORDER — ACETAMINOPHEN 325 MG PO TABS: 650.0000 mg | ORAL_TABLET | Freq: Four times a day (QID) | ORAL | Status: DC | PRN

## 2024-08-28 MED ORDER — INSULIN ASPART 100 UNIT/ML IJ SOLN
0.0000 [IU] | Freq: Every day | INTRAMUSCULAR | Status: DC
  Administered 2024-08-28: 3 [IU] via SUBCUTANEOUS
  Filled 2024-08-28: qty 3

## 2024-08-28 MED ORDER — CYCLOBENZAPRINE HCL 10 MG PO TABS: 10.0000 mg | ORAL_TABLET | Freq: Two times a day (BID) | ORAL | Status: DC | PRN

## 2024-08-28 MED ORDER — SODIUM BICARBONATE 650 MG PO TABS
650.0000 mg | ORAL_TABLET | Freq: Two times a day (BID) | ORAL | Status: DC
  Administered 2024-08-28 – 2024-08-30 (×4): 650 mg via ORAL
  Filled 2024-08-28 (×4): qty 1

## 2024-08-28 MED ORDER — AMLODIPINE BESYLATE 10 MG PO TABS
10.0000 mg | ORAL_TABLET | Freq: Every evening | ORAL | Status: DC
  Administered 2024-08-28 – 2024-08-29 (×2): 10 mg via ORAL
  Filled 2024-08-28 (×2): qty 1

## 2024-08-28 NOTE — Progress Notes (Signed)
 [facility] [place], [place] 08/28/2024  Subjective:   Hospital day # 0  Patient known to our practice from outpatient follow-up.  Last seen by Dr. Marcelino on 07/18/2024. He has advanced chronic kidney disease due to type 1 diabetes, proteinuria, hypertension, anemia and secondary hyperparathyroidism He is admitted for chronic ulcer on the inner side of the left big toe.  He has failed outpatient antibiotics.  MRI shows septic arthritis of the interphalangeal joint of the right great toe.  He is under evaluation for possible right BKA.  He is scheduled for right hallux amputation tomorrow by podiatrist.  He was recommended to get IV vancomycin  but he refused citing concern regarding his CKD.   Objective:  Vital signs in last 24 hours:  Temp:  [97.8 F (36.6 C)-98.2 F (36.8 C)] 97.9 F (36.6 C) (12/30 1317) Pulse Rate:  [70-76] 70 (12/30 1317) Resp:  [16-20] 16 (12/30 1317) BP: (140-151)/(78-89) 143/89 (12/30 1317) SpO2:  [98 %-100 %] 98 % (12/30 1317) Weight:  [98.4 kg] 98.4 kg (12/30 0636)  Weight change:  Filed Weights   08/28/24 0636  Weight: 98.4 kg    Intake/Output:   No intake or output data in the 24 hours ending 08/28/24 1555   Physical Exam: General: No acute distress, laying in the bed  HEENT Anicteric, moist oral mucous membranes  Pulm/lungs Normal breathing effort  CVS/Heart Regular  Abdomen:  Soft, nontender, nondistended  Extremities: No peripheral edema  Neurologic: Alert, oriented  Skin: No acute rashes  Access: Clotted left arm AV fistula       Basic Metabolic Panel:  Recent Labs  Lab 08/28/24 0847  NA 130*  K 5.0  CL 96*  CO2 21*  GLUCOSE 346*  BUN 70*  CREATININE 4.85*  CALCIUM 9.1     CBC: Recent Labs  Lab 08/28/24 0847  WBC 9.8  NEUTROABS 6.4  HGB 10.0*  HCT 30.4*  MCV 88.4  PLT 249      Lab Results  Component Value Date   HEPBSAG NON REACTIVE 02/17/2022      Microbiology:  No results found  for this or any previous visit (from the past 240 hours).  Coagulation Studies: No results for input(s): LABPROT, INR in the last 72 hours.  Urinalysis: No results for input(s): COLORURINE, LABSPEC, PHURINE, GLUCOSEU, HGBUR, BILIRUBINUR, KETONESUR, PROTEINUR, UROBILINOGEN, NITRITE, LEUKOCYTESUR in the last 72 hours.  Invalid input(s): APPERANCEUR    Imaging: MR FOOT RIGHT W WO CONTRAST Result Date: 08/28/2024 CLINICAL DATA:  Concern for osteomyelitis. Wound at the plantar great toe. EXAM: MRI OF THE RIGHT FOREFOOT WITHOUT AND WITH CONTRAST TECHNIQUE: Multiplanar, multisequence MR imaging of the right foot was performed before and after the administration of intravenous contrast. CONTRAST:  10mL GADAVIST  GADOBUTROL  1 MMOL/ML IV SOLN COMPARISON:  Earlier same day radiographs. FINDINGS: Bones/Joint/Cartilage Soft tissue wound at the medial plantar aspect of the great toe tracts to the level of the underlying interphalangeal joint of the great toe. Destructive and erosive changes about the interphalangeal joint of the great toe involving the head of the proximal phalanx and the base of the distal phalanx of the great toe with interphalangeal joint effusion. Marrow signal abnormality with enhancement of the great toe proximal and distal phalanges. These findings are compatible with septic arthritis of the interphalangeal joint of the great toe with osteomyelitis of the proximal and distal phalanges of the great toe. No additional acute osseous abnormality identified. The remainder of the bones demonstrate normal marrow signal  intensity. Degenerative changes of the first metatarsal head-lateral hallux sesamoid articulation. Ligaments Collateral ligaments are intact.  Lisfranc ligament is intact. Muscles and Tendons No acute musculotendinous abnormality. Fatty atrophy of the intrinsic foot musculature likely reflects chronic denervation change. Soft tissue Soft tissue wound at the  medial plantar aspect of the great toe with surrounding soft tissue edema and enhancement and cutaneous thickening. No loculated fluid collection. IMPRESSION: 1. Septic arthritis of the interphalangeal joint of the great toe with osteomyelitis of the great toe proximal and distal phalanges demonstrating associated destructive and erosive changes. 2. No loculated fluid collection. 3. Fatty atrophy of the intrinsic foot musculature likely reflects chronic denervation change. Electronically Signed   By: Harrietta Sherry M.D.   On: 08/28/2024 12:11   DG Toe Great Right Result Date: 08/28/2024 EXAM: 1 VIEW(S) XRAY OF THE RIGHT TOES 08/28/2024 07:16:00 AM COMPARISON: None available. CLINICAL HISTORY: infection infection FINDINGS: BONES AND JOINTS: There are erosive changes present within the first interphalangeal joint, obliterating the head of the proximal phalanx and the base of the distal phalanx. There are also erosive changes within the tuft of the distal phalanx with pathological fracturing. Findings are consistent with septic arthritis and osteomyelitis. SOFT TISSUES: The soft tissues are unremarkable. IMPRESSION: 1. Erosive changes involving the first interphalangeal joint and distal phalanx tuft with pathologic fracture, consistent with septic arthritis and osteomyelitis. Electronically signed by: Evalene Coho MD 08/28/2024 07:45 AM EST RP Workstation: [id number]     Medications:    ceFEPime  (MAXIPIME ) IV 2 g (08/28/24 1501)   vancomycin       amLODipine   10 mg Oral QPM   [START ON 08/29/2024] calcitRIOL  0.25 mcg Oral Daily   carvedilol   6.25 mg Oral BID WC   cloNIDine   0.1 mg Oral BID   enoxaparin (LOVENOX) injection  30 mg Subcutaneous Q24H   insulin  aspart  0-20 Units Subcutaneous TID WC   insulin  aspart  0-5 Units Subcutaneous QHS   insulin  aspart  6 Units Subcutaneous TID WC   insulin  glargine  25 Units Subcutaneous QHS   linagliptin  5 mg Oral Daily   [START ON 08/29/2024]  methadone   100 mg Oral Daily   [START ON 08/29/2024] pantoprazole   40 mg Oral Daily   sodium bicarbonate   650 mg Oral BID   vancomycin  variable dose per unstable renal function (pharmacist dosing)   Does not apply See admin instructions   acetaminophen  **OR** acetaminophen , HYDROcodone-acetaminophen , ondansetron  **OR** ondansetron  (ZOFRAN ) IV  Assessment/ Plan:  33 y.o. male with type 1 diabetes, diabetic nephropathy stage IV, Charcot foot arthropathy left foot and ankle, osteomyelitis right hallux, hepatitis C, hypertension  admitted on 08/28/2024 for Hyperglycemia [R73.9] Gastroesophageal reflux disease without esophagitis [K21.9] Toe osteomyelitis, right (HCC) [M86.9] Muscle spasms of neck [M62.838] Encounter for medication review [Z79.899] Septic arthritis of interphalangeal joint of toe of right foot (HCC) [M00.9] Osteomyelitis of right foot, unspecified type (HCC) [M86.9]  Diabetes type 1 with chronic kidney disease stage IV Hypertension Septic arthritis of toe of right foot, osteomyelitis of right foot Anemia in chronic kidney disease.  Plan: Patient with advanced chronic kidney disease due to type 1 diabetes.  No uremic symptoms at present.  No indication for dialysis. Discussed need for broad-spectrum coverage with IV vancomycin .  Patient has agreed to proceed. We will monitor hemoglobin during admission. We will follow along and monitor volume status and renal function during hospitalization.     LOS: 0 Hykeem Ojeda Dennise 12/30/20253:55 PM  [facility],  [place] [phone]  Note: This note was prepared with Dragon dictation. Any transcription errors are unintentional

## 2024-08-28 NOTE — Progress Notes (Addendum)
 Pharmacy Antibiotic Note  Jason Francis is a 33 y.o. male admitted on 08/28/2024 with toe osteomyelitis.  Pharmacy has been consulted for vancomycin  dosing.  -also on Cefepime  2gm IV q24h -CKD stage 4  -recent doxycycline   Plan: Patient received Vancomycin  1000mg  IV x 1 in ED Will order Vancomycin  1000 mg in addition for a total loading dose of 2000 mg Scr 4.85   Crcl 24.2 ml/min  Will order random Vancomycin  level tomorrow am 12/31  Further vancomycin  dosing based on level  Continue to assess renal fxn, cultures, length of therapy, etc   Height: 5' 8 (172.7 cm) Weight: 98.4 kg (217 lb) IBW/kg (Calculated) : 68.4  Temp (24hrs), Avg:98 F (36.7 C), Min:97.8 F (36.6 C), Max:98.2 F (36.8 C)  Recent Labs  Lab 08/28/24 0847  WBC 9.8  CREATININE 4.85*    Estimated Creatinine Clearance: 24.2 mL/min (A) (by C-G formula based on SCr of 4.85 mg/dL (H)).    Allergies[1]  Antimicrobials this admission: Ceftriaxone /metronidazole  x 1  12/30 Vancomycin   12/30 >>   Cefepime  12/30>>  Dose adjustments this admission:    Microbiology results: 12/30 BCx: pending   UCx:      Sputum:    12/30 MRSA PCR: pending  Thank you for allowing pharmacy to be a part of this patients care.  Allean Haas PharmD Clinical Pharmacist 08/28/2024      [1]  Allergies Allergen Reactions   Augmentin [Amoxicillin-Pot Clavulanate] Nausea And Vomiting   Sulfa Antibiotics Nausea And Vomiting

## 2024-08-28 NOTE — Consult Note (Signed)
 "  PODIATRY CONSULTATION  NAME Jason Francis MRN [id number] DOB 05-10-1991 DOA 08/28/2024   Reason for consult:  Chief Complaint  Patient presents with   Toe Pain    Attending/Consulting physician: CANDIE Punter MD  History of present illness: 33 y.o. male with history of type 1 diabetes stage IV CKD significant Charcot arthropathy with deformity of the left foot and ankle.  Presenting for right great toe infection.  Previously saw Dr. Prentice Ovens on 08/16/2024.  At that visit there was concern for osteomyelitis of the right great toe.  He was advised to proceed to the emergency department for MRI infectious disease consultation and hallux amputation.  He waited to present to the ED until after the holidays.  He is now presenting at his convenience per the prior plan for the right great toe.  On the left side there is significant Charcot arthropathy and deformity.  He is being worked up for possible BKA.  Per the patient this will be done at once the right great toe is healed.  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)    CKD (chronic kidney disease), stage IV (HCC)    Diabetic Charcot foot (HCC)    GERD (gastroesophageal reflux disease)    Hepatitis C    History of kidney stones    Hypertension    MRSA infection    in blood per patient   Nausea & vomiting 02/14/2022   Polysubstance abuse (HCC)    a.) THC + cocaine + TCA + IVDU   Secondary hyperparathyroidism    Substance abuse (HCC)    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)        Latest Ref Rng & Units 08/28/2024    8:47 AM 01/06/2024    9:37 AM 11/17/2023    6:46 AM  CBC  WBC 4.0 - 10.5 K/uL 9.8  9.8    Hemoglobin 13.0 - 17.0 g/dL 89.9  9.6  88.0   Hematocrit 39.0 - 52.0 % 30.4  30.1  35.0   Platelets 150 - 400 K/uL 249  304         Latest Ref Rng & Units 08/28/2024    8:47 AM 01/06/2024    9:37 AM 11/17/2023    6:46 AM  BMP   Glucose 70 - 99 mg/dL 653  791  799   BUN 6 - 20 mg/dL 70  57  49   Creatinine 0.61 - 1.24 mg/dL 5.14  5.30  5.39   Sodium 135 - 145 mmol/L 130  133  134   Potassium 3.5 - 5.1 mmol/L 5.0  4.6  4.7   Chloride 98 - 111 mmol/L 96  103  103   CO2 22 - 32 mmol/L 21  21    Calcium 8.9 - 10.3 mg/dL 9.1  9.1        Physical Exam: Lower Extremity Exam  Right hallux ucleration platnar medial aspect with mild surrouding erythema Edmea of the right hallux  Sensation diminished to light touch  DP and PT 2+ R foot  L foot and ankle with significant charcot deformity         ASSESSMENT/PLAN OF CARE 33 y.o. male with PMHx significant for type 1 diabetes CKD 4 Charcot arthropathy left foot and ankle with acute osteomyelitis right hallux interphalangeal joint and septic IPJ  MRI right foot: Pending XR right foot concern for  osteomyelitis proximal distal phalanx and septic IPJ  - NPO p MN for OR tomorrow for R hallux amputation, timing pending, likely AM. Pt agrees to proceed after discussion risks benefits alternatives.  - Continue IV abx broad spectrum pending further culture data - Anticoagulation: Hold pending OR - Wound care: None needed preoperatively - WB status: Weightbearing as tolerated preop will be in postop shoe following surgery - Will continue to follow   Thank you for the consult.  Please contact me directly with any questions or concerns.           Jason Francis, DPM [facility] / [facility]    [address] [place], [place] [address]                Office [phone]  Fax [phone]     "

## 2024-08-28 NOTE — H&P (Signed)
 " History and Physical    Jason Francis FMW:[id number] DOB: 1991/02/19 DOA: 08/28/2024  PCP: Liana Fish, NP (Confirm with patient/family/NH records and if not entered, this has to be entered at [facility] point of entry) Patient coming from: Home  I have personally briefly reviewed patient's old medical records in [facility]  Chief Complaint: Worsening of right big toe infection  HPI: Jason Francis is a 33 y.o. male with medical history significant of IDDM, CKD stage IV, diabetic Charcot foot on the left side, hepatitis C, HTN, presented with worsening of right big toe infection.  Patient has a chronic ulcer on the inner side of left big toe, developed about 1 month ago, initially attributed to bunion problem.  She went to podiatry 1 week ago, podiatrist suspected osteomyelitis and recommended patient come to hospital for IV antibiotics.  Patient however declined and instead patient was started on doxycycline .  Patient however has been seen worsening of swelling and discharge from the wound however no much pain probably because he has significant diabetic neuropathy at the same time.  Denies any fever or chills.  Today he decided to come to the ED for worsening of infection.  ED Course: Afebrile, nontachycardic blood pressure 148/80 O2 saturation 100% on room air.  MRI positive for septic arthritis of the interphalangeal joint of the great toe with osteomyelitis.  Blood work showed WBC 9.0 hemoglobin 10.0 BUN 70 creatinine 4.8 compared to baseline 4.0-1.6 glucose 346 sodium 130 potassium 5.0.  Patient refused vancomycin  in the ED.  Review of Systems: As per HPI otherwise 14 point review of systems negative.    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)    CKD (chronic kidney disease), stage IV (HCC)    Diabetic Charcot foot (HCC)    GERD (gastroesophageal reflux disease)    Hepatitis C    History of kidney stones     Hypertension    MRSA infection    in blood per patient   Nausea & vomiting 02/14/2022   Polysubstance abuse (HCC)    a.) THC + cocaine + TCA + IVDU   Secondary hyperparathyroidism    Substance abuse (HCC)    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)     Past Surgical History:  Procedure Laterality Date   APPENDECTOMY     AV FISTULA PLACEMENT Left 11/17/2023   Procedure: ARTERIOVENOUS (AV) FISTULA CREATION;  Surgeon: Marea Selinda RAMAN, MD;  Location: [facility] ORS;  Service: Vascular;  Laterality: Left;   CYSTOSCOPY, WITH RETROGRADE PYELOGRAM, URETEROSCOPY, URINARY CALCULUS LASER LITHOTRIPSY, AND STENT INSERT     FOOT SURGERY Left 2019   MYRINGOTOMY       reports that he has been smoking cigarettes. He started smoking about 22 years ago. He has a 22 pack-year smoking history. He has never used smokeless tobacco. He reports that he does not currently use alcohol. He reports current drug use. Drugs: Marijuana, Cocaine, and Heroin.  Allergies[1]  Family History  Problem Relation Age of Onset   Heart disease Mother    Hypertension Mother    Obesity Mother    Gout Mother    Thyroid disease Mother    High blood pressure Maternal Grandmother    Dementia Maternal Grandmother    Heart disease Maternal Grandfather      Prior to Admission medications  Medication Sig Start Date End Date Taking? Authorizing Provider  Accu-Chek Softclix Lancets lancets Use 1 lancet to check glucose 4 times daily and as needed for diabetes E11.65 03/09/24   Liana Fish, NP  acetaminophen  (TYLENOL ) 500 MG tablet Take 1,000 mg by mouth every 6 (six) hours as needed.    [provider]  amLODipine  (NORVASC ) 10 MG tablet Take 10 mg by mouth every evening.    [provider]  Blood Glucose Monitoring Suppl (ACCU-CHEK GUIDE ME) w/Device KIT USE AS DIRECTED 04/02/24   Abernathy, Alyssa, NP  calcitRIOL (ROCALTROL) 0.25 MCG capsule Take 0.25 mcg by mouth daily. 05/16/22   [provider]  carvedilol  (COREG ) 6.25 MG tablet Take 6.25 mg by mouth 2 (two) times daily with a meal.    [provider]  cloNIDine  (CATAPRES ) 0.1 MG tablet Take 0.1 mg by mouth 2 (two) times daily.    [provider]  cyclobenzaprine  (FLEXERIL ) 10 MG tablet Take 1 tablet (10 mg total) by mouth 2 (two) times daily as needed for muscle spasms. 07/13/24   Abernathy, Alyssa, NP  doxycycline  (VIBRA -TABS) 100 MG tablet Take 1 tablet (100 mg total) by mouth 2 (two) times daily. 08/07/24   Khan, Fozia M, MD  Ferrous Sulfate (IRON ) 28 MG TABS Take 2 tablets by mouth daily.    [provider]  glucose blood (ACCU-CHEK GUIDE TEST) test strip Use 1 test strip to check glucose 4 times daily and as needed for diabetes E11.65 03/09/24   Liana Fish, NP  insulin  NPH Human (NOVOLIN  N) 100 UNIT/ML injection Inject 0.28 mLs (28 Units total) into the skin 2 (two) times daily before a meal. 07/13/24   Abernathy, Alyssa, NP  insulin  regular (HUMULIN R ) 100 units/mL injection INJECT INTO SKIN BEFORE MEALS ACCORDING TO SLIDING SCALE - MAX DOSE 48 UNITS IN 24 HOURS 12/08/23   Abernathy, Fish, NP  methadone  (DOLOPHINE ) 1 MG/1ML solution Take 100 mg by mouth daily. Methadone  clinic    [provider]  metoCLOPramide  (REGLAN ) 10 MG tablet TAKE 1 TABLET BY MOUTH TWICE DAILY AS NEEDED FOR NAUSEA OR VOMITING 02/10/24   Liana Fish, NP  pantoprazole  (PROTONIX ) 40 MG tablet Take 1 tablet (40 mg total) by mouth daily. 07/13/24   Liana Fish, NP  sodium bicarbonate  650 MG tablet Take 650 mg by mouth 2 (two) times daily.    [provider]    Physical Exam: Vitals:   08/28/24 0634 08/28/24 0636 08/28/24 1036  BP: (!) 151/87  (!) 148/80  Pulse: 74  70  Resp: 20  20  Temp: 97.8 F (36.6 C)  98 F (36.7 C)  TempSrc: Oral  Oral  SpO2: 100%  100%  Weight:  98.4 kg   Height:  5' 8 (1.727 m)     Constitutional: NAD, calm, comfortable Vitals:   08/28/24 0634  08/28/24 0636 08/28/24 1036  BP: (!) 151/87  (!) 148/80  Pulse: 74  70  Resp: 20  20  Temp: 97.8 F (36.6 C)  98 F (36.7 C)  TempSrc: Oral  Oral  SpO2: 100%  100%  Weight:  98.4 kg   Height:  5' 8 (1.727 m)    Eyes: PERRL, lids and conjunctivae normal ENMT: Mucous membranes are moist. Posterior pharynx clear of any exudate or lesions.Normal dentition.  Neck: normal, supple, no masses, no thyromegaly Respiratory: clear to auscultation bilaterally, no wheezing, no crackles. Normal respiratory effort. No accessory muscle use.  Cardiovascular: Regular rate and rhythm, no murmurs / rubs / gallops. No extremity edema. 2+ pedal  pulses. No carotid bruits.  Abdomen: no tenderness, no masses palpated. No hepatosplenomegaly. Bowel sounds positive.  Musculoskeletal: no clubbing / cyanosis. No joint deformity upper and lower extremities. Good ROM, no contractures. Normal muscle tone.  Skin: Ulcer on the medial side of left big toe with maculopapular rash and edema in surrounding tissue Neurologic: CN 2-12 grossly intact. Sensation intact, DTR normal. Strength 5/5 in all 4.  Psychiatric: Normal judgment and insight. Alert and oriented x 3. Normal mood.       Labs on Admission: I have personally reviewed following labs and imaging studies  CBC: Recent Labs  Lab 08/28/24 0847  WBC 9.8  NEUTROABS 6.4  HGB 10.0*  HCT 30.4*  MCV 88.4  PLT 249   Basic Metabolic Panel: Recent Labs  Lab 08/28/24 0847  NA 130*  K 5.0  CL 96*  CO2 21*  GLUCOSE 346*  BUN 70*  CREATININE 4.85*  CALCIUM 9.1   GFR: Estimated Creatinine Clearance: 24.2 mL/min (A) (by C-G formula based on SCr of 4.85 mg/dL (H)). Liver Function Tests: Recent Labs  Lab 08/28/24 0847  AST 32  ALT 30  ALKPHOS 148*  BILITOT 0.4  PROT 8.1  ALBUMIN 3.6   No results for input(s): LIPASE, AMYLASE in the last 168 hours. No results for input(s): AMMONIA in the last 168 hours. Coagulation Profile: No results for  input(s): INR, PROTIME in the last 168 hours. Cardiac Enzymes: No results for input(s): CKTOTAL, CKMB, CKMBINDEX, TROPONINI in the last 168 hours. BNP (last 3 results) No results for input(s): PROBNP in the last 8760 hours. HbA1C: No results for input(s): HGBA1C in the last 72 hours. CBG: No results for input(s): GLUCAP in the last 168 hours. Lipid Profile: No results for input(s): CHOL, HDL, LDLCALC, TRIG, CHOLHDL, LDLDIRECT in the last 72 hours. Thyroid Function Tests: No results for input(s): TSH, T4TOTAL, FREET4, T3FREE, THYROIDAB in the last 72 hours. Anemia Panel: No results for input(s): VITAMINB12, FOLATE, FERRITIN, TIBC, IRON , RETICCTPCT in the last 72 hours. Urine analysis:    Component Value Date/Time   COLORURINE AMBER (A) 05/08/2023 1512   APPEARANCEUR CLOUDY (A) 05/08/2023 1512   LABSPEC 1.010 05/08/2023 1512   PHURINE 6.0 05/08/2023 1512   GLUCOSEU 50 (A) 05/08/2023 1512   HGBUR MODERATE (A) 05/08/2023 1512   BILIRUBINUR negative 05/16/2023 1010   KETONESUR NEGATIVE 05/08/2023 1512   PROTEINUR Negative 05/16/2023 1010   PROTEINUR 100 (A) 05/08/2023 1512   UROBILINOGEN 0.2 05/16/2023 1010   NITRITE negative 05/16/2023 1010   NITRITE NEGATIVE 05/08/2023 1512   LEUKOCYTESUR Negative 05/16/2023 1010   LEUKOCYTESUR MODERATE (A) 05/08/2023 1512    Radiological Exams on Admission: MR FOOT RIGHT W WO CONTRAST Result Date: 08/28/2024 CLINICAL DATA:  Concern for osteomyelitis. Wound at the plantar great toe. EXAM: MRI OF THE RIGHT FOREFOOT WITHOUT AND WITH CONTRAST TECHNIQUE: Multiplanar, multisequence MR imaging of the right foot was performed before and after the administration of intravenous contrast. CONTRAST:  10mL GADAVIST  GADOBUTROL  1 MMOL/ML IV SOLN COMPARISON:  Earlier same day radiographs. FINDINGS: Bones/Joint/Cartilage Soft tissue wound at the medial plantar aspect of the great toe tracts to the level of the  underlying interphalangeal joint of the great toe. Destructive and erosive changes about the interphalangeal joint of the great toe involving the head of the proximal phalanx and the base of the distal phalanx of the great toe with interphalangeal joint effusion. Marrow signal abnormality with enhancement of the great toe proximal and distal phalanges. These findings are  compatible with septic arthritis of the interphalangeal joint of the great toe with osteomyelitis of the proximal and distal phalanges of the great toe. No additional acute osseous abnormality identified. The remainder of the bones demonstrate normal marrow signal intensity. Degenerative changes of the first metatarsal head-lateral hallux sesamoid articulation. Ligaments Collateral ligaments are intact.  Lisfranc ligament is intact. Muscles and Tendons No acute musculotendinous abnormality. Fatty atrophy of the intrinsic foot musculature likely reflects chronic denervation change. Soft tissue Soft tissue wound at the medial plantar aspect of the great toe with surrounding soft tissue edema and enhancement and cutaneous thickening. No loculated fluid collection. IMPRESSION: 1. Septic arthritis of the interphalangeal joint of the great toe with osteomyelitis of the great toe proximal and distal phalanges demonstrating associated destructive and erosive changes. 2. No loculated fluid collection. 3. Fatty atrophy of the intrinsic foot musculature likely reflects chronic denervation change. Electronically Signed   By: Harrietta Sherry M.D.   On: 08/28/2024 12:11   DG Toe Great Right Result Date: 08/28/2024 EXAM: 1 VIEW(S) XRAY OF THE RIGHT TOES 08/28/2024 07:16:00 AM COMPARISON: None available. CLINICAL HISTORY: infection infection FINDINGS: BONES AND JOINTS: There are erosive changes present within the first interphalangeal joint, obliterating the head of the proximal phalanx and the base of the distal phalanx. There are also erosive changes within  the tuft of the distal phalanx with pathological fracturing. Findings are consistent with septic arthritis and osteomyelitis. SOFT TISSUES: The soft tissues are unremarkable. IMPRESSION: 1. Erosive changes involving the first interphalangeal joint and distal phalanx tuft with pathologic fracture, consistent with septic arthritis and osteomyelitis. Electronically signed by: Evalene Coho MD 08/28/2024 07:45 AM EST RP Workstation: [id number]    EKG: None  Assessment/Plan Principal Problem:   Toe osteomyelitis, right (HCC) Active Problems:   Osteomyelitis of great toe of right foot (HCC)  (please populate well all problems here in Problem List. (For example, if patient is on BP meds at home and you resume or decide to hold them, it is a problem that needs to be her. Same for CAD, COPD, HLD and so on)  Right big toe osteomyelitis Right big toe interphalangeal septic arthritis - Failed outpatient management - Expect  right big toe amputation.  Patient made aware. - Patient refused vancomycin , for now we will cover him with doxycycline  and cefepime .  Check MRSA screening. - Patient denied any history of claudication, as per podiatry, vascular surgeon also consulted.  IDDM with hyperglycemia - SSI - Start Januvia  CKD stage IV - Status post AV fistula on left arm established this year. - Currently the patient is euvolemic with a gradually worsening of BUN and creatinine and borderline hyperkalemia - Consult nephrology to optimize his medications.  HTN - Continue amlodipine , Coreg , clonidine   Chronic methadone  therapy - Resume methadone  100 mg daily  DVT prophylaxis: Lovenox Code Status: Full code Family Communication: Mother at bedside Disposition Plan: Patient sick with worsening of left big toe infection now deteriorated into osteomyelitis and septic arthritis requiring IV antibiotics and inpatient podiatry intervention, expect more than 2 midnight hospital stay Consults called:  Podiatry and vascular surgery Admission status: MedSurg admission   Cort ONEIDA Mana MD [facility] Pager [phone]  08/28/2024, 12:22 PM       [1]  Allergies Allergen Reactions   Augmentin [Amoxicillin-Pot Clavulanate] Nausea And Vomiting   Sulfa Antibiotics Nausea And Vomiting   "

## 2024-08-28 NOTE — ED Triage Notes (Signed)
 Pt saw podiatrist 1 week ago for wound on bottom of right great toe, pt had callus cut away. Pt reports he was put on doxycyline. Pt here for XR to make sure infection has not spread to bone.

## 2024-08-28 NOTE — ED Notes (Signed)
 See triage note  Presents with pain to right great toe  States he was sent in to see if the infection went to the bone  Has been on doxy for 2 weeks  Afebrile on arrival

## 2024-08-28 NOTE — Consult Note (Cosign Needed Addendum)
 " [facility] Vascular Consult Note  MRN : [id number]  Jason Francis is a 33 y.o. (June 29, 1991) male who presents with chief complaint of  Chief Complaint  Patient presents with   Toe Pain  .   Consulting Physician:Jenna Poggi, PA-C Reason for consult: Right toe osteomyelitis History of Present Illness: Jason Francis is a 33 year old male who presents to [facility] due to right toe osteomyelitis.  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  He has chronic kidney disease stage IV.  He has a chronic ulcer on the underside of his right big toe, and it was noted that he has been having worsening swelling and redness to the wound area.  It was initially recommended that he go to the hospital to start on IV antibiotics but he declined and was started on doxycycline .  Unfortunately the wound did not improve.  The patient is well-known to vascular surgery as we recently saw the patient in office on 08/17/2024.  The patient was seen to discuss amputation of his left lower extremity due to significant Charcot foot deformity.  However the patient was also dealing with the ulceration on the right great toe and we recommended that the patient move forward with amputation of the right great toe initially before we perform any intervention on the left leg.  Given his history of chronic kidney disease stage IV we did not wish to move forward with an angiogram and elected for noninvasive testing with an ABI.  Preliminary results show an ABI of 1.23, with strong triphasic tibial waveforms in the right lower extremity.  Current Facility-Administered Medications  Medication Dose Route Frequency Provider Last Rate Last Admin   acetaminophen  (TYLENOL ) tablet 650 mg  650 mg Oral Q6H PRN Laurita Cort DASEN, MD       Or   acetaminophen  (TYLENOL ) suppository 650 mg  650 mg Rectal Q6H PRN Laurita Cort DASEN, MD       amLODipine  (NORVASC ) tablet 10 mg  10 mg Oral QPM  Laurita Cort DASEN, MD       [START ON 08/29/2024] calcitRIOL (ROCALTROL) capsule 0.25 mcg  0.25 mcg Oral Daily Laurita Cort T, MD       carvedilol  (COREG ) tablet 6.25 mg  6.25 mg Oral BID WC Laurita Cort DASEN, MD       ceFEPIme  (MAXIPIME ) 2 g in sodium chloride  0.9 % 100 mL IVPB  2 g Intravenous Q24H Niels Kayla FALCON, RPH 200 mL/hr at 08/28/24 1501 2 g at 08/28/24 1501   cloNIDine  (CATAPRES ) tablet 0.1 mg  0.1 mg Oral BID Laurita Cort T, MD       enoxaparin (LOVENOX) injection 30 mg  30 mg Subcutaneous Q24H Laurita Cort T, MD       HYDROcodone-acetaminophen  (NORCO/VICODIN) 5-325 MG per tablet 1-2 tablet  1-2 tablet Oral Q4H PRN Laurita Cort T, MD       insulin  aspart (novoLOG ) injection 0-20 Units  0-20 Units Subcutaneous TID WC Zhang, Ping T, MD   6 Units at 08/28/24 1234   insulin  aspart (novoLOG ) injection 0-5 Units  0-5 Units Subcutaneous QHS Laurita Cort T, MD       insulin  aspart (novoLOG ) injection 6 Units  6 Units Subcutaneous TID WC Laurita Cort T, MD       insulin  glargine (LANTUS ) injection 25 Units  25 Units Subcutaneous QHS Laurita Cort T, MD       linagliptin (TRADJENTA) tablet 5 mg  5 mg  Oral Daily Laurita Cort DASEN, MD       [START ON 08/29/2024] methadone  (DOLOPHINE ) 10 MG/ML solution 100 mg  100 mg Oral Daily Laurita Cort T, MD       ondansetron  (ZOFRAN ) tablet 4 mg  4 mg Oral Q6H PRN Laurita Cort DASEN, MD       Or   ondansetron  (ZOFRAN ) injection 4 mg  4 mg Intravenous Q6H PRN Laurita Cort DASEN, MD       [START ON 08/29/2024] pantoprazole  (PROTONIX ) EC tablet 40 mg  40 mg Oral Daily Laurita Cort DASEN, MD       sodium bicarbonate  tablet 650 mg  650 mg Oral BID Laurita Cort T, MD       vancomycin  (VANCOCIN ) IVPB 1000 mg/200 mL premix  1,000 mg Intravenous Once Merrill, Kristin A, RPH 200 mL/hr at 08/28/24 1649 1,000 mg at 08/28/24 1649   vancomycin  variable dose per unstable renal function (pharmacist dosing)   Does not apply See admin instructions Suzann Allean LABOR [facility]        Past Medical History:   Diagnosis Date   Anemia    Asthma    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)    CKD (chronic kidney disease), stage IV (HCC)    Diabetic Charcot foot (HCC)    GERD (gastroesophageal reflux disease)    Hepatitis C    History of kidney stones    Hypertension    MRSA infection    in blood per patient   Nausea & vomiting 02/14/2022   Polysubstance abuse (HCC)    a.) THC + cocaine + TCA + IVDU   Secondary hyperparathyroidism    Substance abuse (HCC)    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)     Past Surgical History:  Procedure Laterality Date   APPENDECTOMY     AV FISTULA PLACEMENT Left 11/17/2023   Procedure: ARTERIOVENOUS (AV) FISTULA CREATION;  Surgeon: Marea Selinda RAMAN, MD;  Location: [facility] ORS;  Service: Vascular;  Laterality: Left;   CYSTOSCOPY, WITH RETROGRADE PYELOGRAM, URETEROSCOPY, URINARY CALCULUS LASER LITHOTRIPSY, AND STENT INSERT     FOOT SURGERY Left 2019   MYRINGOTOMY      Social History Social History[1]  Family History Family History  Problem Relation Age of Onset   Heart disease Mother    Hypertension Mother    Obesity Mother    Gout Mother    Thyroid disease Mother    High blood pressure Maternal Grandmother    Dementia Maternal Grandmother    Heart disease Maternal Grandfather     Allergies[2]   REVIEW OF SYSTEMS (Negative unless checked)  Constitutional: [] Weight loss  [] Fever  [] Chills Cardiac: [] Chest pain   [] Chest pressure   [] Palpitations   [] Shortness of breath when laying flat   [] Shortness of breath at rest   [] Shortness of breath with exertion. Vascular:  [] Pain in legs with walking   [] Pain in legs at rest   [] Pain in legs when laying flat   [] Claudication   [] Pain in feet when walking  [] Pain in feet at rest  [] Pain in feet when laying flat   [] History of DVT   [] Phlebitis   [] Swelling in legs   [] Varicose veins   [] Non-healing ulcers Pulmonary:   [] Uses home oxygen   [] Productive cough   [] Hemoptysis    [] Wheeze  [] COPD   [] Asthma Neurologic:  [] Dizziness  [] Blackouts   [] Seizures   [] History of stroke   [] History of TIA  []   Aphasia   [] Temporary blindness   [] Dysphagia   [] Weakness or numbness in arms   [] Weakness or numbness in legs Musculoskeletal:  [] Arthritis   [] Joint swelling   [] Joint pain   [] Low back pain Hematologic:  [] Easy bruising  [] Easy bleeding   [] Hypercoagulable state   [] Anemic  [] Hepatitis Gastrointestinal:  [] Blood in stool   [] Vomiting blood  [] Gastroesophageal reflux/heartburn   [] Difficulty swallowing. Genitourinary:  [] Chronic kidney disease   [] Difficult urination  [] Frequent urination  [] Burning with urination   [] Blood in urine Skin:  [] Rashes   [] Ulcers   [x] Wounds Psychological:  [] History of anxiety   []  History of major depression.  Physical Examination  Vitals:   08/28/24 0636 08/28/24 1036 08/28/24 1245 08/28/24 1317  BP:  (!) 148/80 (!) 140/78 (!) 143/89  Pulse:  70 76 70  Resp:  20 18 16   Temp:  98 F (36.7 C) 98.2 F (36.8 C) 97.9 F (36.6 C)  TempSrc:  Oral  Oral  SpO2:  100% 100% 98%  Weight: 98.4 kg     Height: 5' 8 (1.727 m)      Body mass index is 32.99 kg/m. Gen:  WD/WN, NAD Head: [place]/AT, No temporalis wasting. Prominent temp pulse not noted. Ear/Nose/Throat: Hearing grossly intact, nares w/o erythema or drainage, oropharynx w/o Erythema/Exudate Eyes: Sclera non-icteric, conjunctiva clear Neck: Trachea midline.  No JVD.  Pulmonary:  Good air movement, respirations not labored, equal bilaterally.  Cardiac: RRR, normal S1, S2. Vascular:  Vessel Right Left  PT Palpable   DP Palpable    Gastrointestinal: soft, non-tender/non-distended. No guarding/reflex.  Musculoskeletal: Left lower extremity Charcot deformity Neurologic: Sensation grossly intact in extremities.  Symmetrical.  Speech is fluent. Motor exam as listed above. Psychiatric: Judgment intact, Mood & affect appropriate for pt's clinical situation. Dermatologic: Wound on right  great toe Lymph : No Cervical, Axillary, or Inguinal lymphadenopathy.    CBC Lab Results  Component Value Date   WBC 9.8 08/28/2024   HGB 10.0 (L) 08/28/2024   HCT 30.4 (L) 08/28/2024   MCV 88.4 08/28/2024   PLT 249 08/28/2024    BMET    Component Value Date/Time   NA 130 (L) 08/28/2024 0847   K 5.0 08/28/2024 0847   CL 96 (L) 08/28/2024 0847   CO2 21 (L) 08/28/2024 0847   GLUCOSE 346 (H) 08/28/2024 0847   BUN 70 (H) 08/28/2024 0847   CREATININE 4.85 (H) 08/28/2024 0847   CREATININE 4.38 (H) 11/16/2023 1409   CALCIUM 9.1 08/28/2024 0847   GFRNONAA 15 (L) 08/28/2024 0847   GFRNONAA 17 (L) 11/16/2023 1409   Estimated Creatinine Clearance: 24.2 mL/min (A) (by C-G formula based on SCr of 4.85 mg/dL (H)).  COAG No results found for: INR, PROTIME  Radiology MR FOOT RIGHT W WO CONTRAST Result Date: 08/28/2024 CLINICAL DATA:  Concern for osteomyelitis. Wound at the plantar great toe. EXAM: MRI OF THE RIGHT FOREFOOT WITHOUT AND WITH CONTRAST TECHNIQUE: Multiplanar, multisequence MR imaging of the right foot was performed before and after the administration of intravenous contrast. CONTRAST:  10mL GADAVIST  GADOBUTROL  1 MMOL/ML IV SOLN COMPARISON:  Earlier same day radiographs. FINDINGS: Bones/Joint/Cartilage Soft tissue wound at the medial plantar aspect of the great toe tracts to the level of the underlying interphalangeal joint of the great toe. Destructive and erosive changes about the interphalangeal joint of the great toe involving the head of the proximal phalanx and the base of the distal phalanx of the great toe with interphalangeal joint effusion. Marrow  signal abnormality with enhancement of the great toe proximal and distal phalanges. These findings are compatible with septic arthritis of the interphalangeal joint of the great toe with osteomyelitis of the proximal and distal phalanges of the great toe. No additional acute osseous abnormality identified. The remainder of  the bones demonstrate normal marrow signal intensity. Degenerative changes of the first metatarsal head-lateral hallux sesamoid articulation. Ligaments Collateral ligaments are intact.  Lisfranc ligament is intact. Muscles and Tendons No acute musculotendinous abnormality. Fatty atrophy of the intrinsic foot musculature likely reflects chronic denervation change. Soft tissue Soft tissue wound at the medial plantar aspect of the great toe with surrounding soft tissue edema and enhancement and cutaneous thickening. No loculated fluid collection. IMPRESSION: 1. Septic arthritis of the interphalangeal joint of the great toe with osteomyelitis of the great toe proximal and distal phalanges demonstrating associated destructive and erosive changes. 2. No loculated fluid collection. 3. Fatty atrophy of the intrinsic foot musculature likely reflects chronic denervation change. Electronically Signed   By: Harrietta Sherry M.D.   On: 08/28/2024 12:11   DG Toe Great Right Result Date: 08/28/2024 EXAM: 1 VIEW(S) XRAY OF THE RIGHT TOES 08/28/2024 07:16:00 AM COMPARISON: None available. CLINICAL HISTORY: infection infection FINDINGS: BONES AND JOINTS: There are erosive changes present within the first interphalangeal joint, obliterating the head of the proximal phalanx and the base of the distal phalanx. There are also erosive changes within the tuft of the distal phalanx with pathological fracturing. Findings are consistent with septic arthritis and osteomyelitis. SOFT TISSUES: The soft tissues are unremarkable. IMPRESSION: 1. Erosive changes involving the first interphalangeal joint and distal phalanx tuft with pathologic fracture, consistent with septic arthritis and osteomyelitis. Electronically signed by: Evalene Coho MD 08/28/2024 07:45 AM EST RP Workstation: [id number]   DG Foot Complete Left Result Date: 08/20/2024 Please see detailed radiograph report in office note.  MR ANKLE LEFT WO CONTRAST Result Date:  08/13/2024 CLINICAL DATA:  Left ankle swelling, chronic deterioration EXAM: MRI OF THE LEFT ANKLE WITHOUT CONTRAST TECHNIQUE: Multiplanar, multisequence MR imaging of the ankle was performed. No intravenous contrast was administered. COMPARISON:  Ankle x-ray 07/10/2024 FINDINGS: Bones/Joint/Cartilage Diffuse advanced arthropathy of the ankle in foot with fragmentation of the midfoot and resorption of the distal tibia and fibula consistent with neuropathic arthropathy. Mild marrow edema in the distal tibia and fibula which may be reactive versus secondary to osteomyelitis. Large amount of complex fluid in the ankle joint with severe synovitis. Muscles and Tendons Mild muscle atrophy. Flexor, extensor, peroneal and Achilles tendons are intact. Soft tissue No fluid collection or hematoma.  No soft tissue mass. IMPRESSION: 1. Diffuse advanced arthropathy of the ankle in foot with fragmentation of the midfoot and resorption of the distal tibia and fibula consistent with neuropathic arthropathy. Mild marrow edema in the distal tibia and fibula which may be reactive versus secondary to osteomyelitis. 2. Large amount of complex fluid in the ankle joint with severe synovitis which may be inflammatory, but infectious etiology is not excluded. Recommend fluid sampling for further evaluation. Electronically Signed   By: Julaine Blanch M.D.   On: 08/13/2024 14:05      Assessment/Plan 1. Osteomyelitis right toe   The patient has strongly palpable pulses.  Given his history of CKD stage IV, we wish to avoid nephrotoxic agents such as contrast dye.  Based on his preliminary ABIs done today, he has an ABI 1.23 as well as strong triphasic tibial waveforms.  Based on this we feel that he should  have adequate ability for wound healing.  We are agreeable to the patient moving forward with his surgery tomorrow with podiatry.  We can see the patient outpatient, following rehab of his amputation to discuss moving forward with his  left below-knee amputation as noted below.  2. 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  It given the patient's significant Charcot deformity he would likely benefit from below-knee amputation.  Based on our discussion we would not plan on any below-knee amputation until the patient has had amputation of his right toe and underwent several weeks of rehabilitation for mobility before we plan on any amputation of the left lower extremity. 3. Chronic kidney disease, stage 4   Patient has known chronic kidney disease and is nearing closure to dialysis.  We wish to avoid nephrotoxic agents such as diet is much as possible.  Based on this as noted above, we will implant intervention if the studies indicate that he does not have adequate perfusion and no evidence of upcoming amputation.  Plan of care discussed with Dr.Schnier and he is in agreement with plan noted above.   Family Communication: Girlfriend/ [employer] at [employer] Time:75 I spent 75 minutes in this encounter including personally reviewing extensive medical records, personally reviewing imaging studies and compared to prior scans, counseling the patient, placing orders, coordinating care and performing appropriate documentation  Thank you for allowing us  to participate in the care of this patient.   Orvin FORBES Daring, NP [facility] [phone] (Office Phone) [phone] (Office Fax) [phone] (Pager)  08/28/2024 4:58 PM  Staff may message me via secure chat in Epic  but this may not receive immediate response,  please page for urgent matters!  Dictation software was used to generate the above note. Typos may occur and escape review, as with typed/written notes. Any error is purely unintentional.  Please contact me directly for clarity if needed.       [1]  Social History Tobacco Use   Smoking status:  Every Day    Current packs/day: 1.00    Average packs/day: 1 pack/day for 22.0 years (22.0 ttl pk-yrs)    Types: Cigarettes    Start date: 08/30/2002   Smokeless tobacco: Never   Tobacco comments:    1/2 pack daily.  Vaping Use   Vaping status: Former  Substance Use Topics   Alcohol use: Not Currently   Drug use: Yes    Types: Marijuana, Cocaine, Heroin    Comment: last stated cocaine use ; marijuana daily  [2]  Allergies Allergen Reactions   Augmentin [Amoxicillin-Pot Clavulanate] Nausea And Vomiting   Sulfa Antibiotics Nausea And Vomiting   "

## 2024-08-28 NOTE — ED Provider Notes (Signed)
 "  [facility] Provider Note    Event Date/Time   First MD Initiated Contact with Patient 08/28/24 0725     (approximate)   History   Toe Pain   HPI  Jason Francis is a 33 y.o. male with a past medical history of CKD, hypertension, opiate use disorder, osteomyelitis, insulin -dependent type 1 diabetic who presents today for evaluation of toe pain.  Patient reports that he had pulled a callus off several weeks ago and has developed persistent erythema and swelling.  He was prescribed doxycycline  by his primary care provider reports that he has been on it for over 2 weeks.  He does feel that the redness has improved.  Reports that he saw the podiatrist who wanted him to have an MRI and admission for an amputation, however patient did not want to come over the holidays, but has presented today now that the holidays are over.  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) 02/14/2022   Insulin  dependent type 1 diabetes mellitus (HCC) 02/14/2022   AKI (acute kidney injury) 02/14/2022   Protein-calorie malnutrition, severe 02/14/2022   Hyponatremia 02/14/2022   Nausea & vomiting 02/14/2022   Cocaine use 02/14/2022   Tobacco use 02/14/2022   Hyperkalemia 02/14/2022   Osteomyelitis of foot, left, acute (HCC) 01/03/2019   Type 1 diabetes mellitus with diabetic foot infection (HCC) 01/03/2019   Diabetic polyneuropathy associated with type 1 diabetes mellitus  (HCC) 05/26/2018   Anemia due to chronic kidney disease 05/26/2018   Benign essential hypertension 05/26/2018   Hepatitis C virus infection, unspecified chronicity 05/25/2018   Diabetic ulcer of toe of left foot associated with diabetes mellitus due to underlying condition, with fat layer exposed (HCC) 02/12/2018   Normocytic anemia 02/12/2018   Acute on chronic kidney failure 02/12/2018   Patient on methadone  maintenance therapy 02/12/2018   History of substance use 08/15/2016   Tobacco use disorder, continuous 08/15/2016   Ureterolithiasis 08/15/2016   Type 1 diabetes (HCC) 10/31/2013          Physical Exam   Triage Vital Signs: ED Triage Vitals  Encounter Vitals Group     BP 08/28/24 0634 (!) 151/87     Girls Systolic BP Percentile --      Girls Diastolic BP Percentile --      Boys Systolic BP Percentile --      Boys Diastolic BP Percentile --      Pulse Rate 08/28/24 0634 74     Resp 08/28/24 0634 20     Temp 08/28/24 0634 97.8 F (36.6 C)     Temp Source 08/28/24 0634 Oral     SpO2 08/28/24 0634 100 %     Weight 08/28/24 0636 217 lb (98.4 kg)     Height 08/28/24 0636 5' 8 (1.727 m)     Head Circumference --      Peak Flow --      Pain Score 08/28/24 0636 2  Pain Loc --      Pain Education --      Exclude from Growth Chart --     Most recent vital signs: Vitals:   08/28/24 1036 08/28/24 1245  BP: (!) 148/80 (!) 140/78  Pulse: 70 76  Resp: 20 18  Temp: 98 F (36.7 C) 98.2 F (36.8 C)  SpO2: 100% 100%    Physical Exam Vitals and nursing note reviewed.  Constitutional:      General: Awake and alert. No acute distress though chronically ill-appearing    Appearance: Normal appearance. The patient is normal weight.  HENT:     Head: Normocephalic and atraumatic.     Mouth: Mucous membranes are moist.  Eyes:     General: PERRL. Normal EOMs        Right eye: No discharge.        Left eye: No discharge.     Conjunctiva/sclera: Conjunctivae normal.   Cardiovascular:     Rate and Rhythm: Normal rate and regular rhythm.     Pulses: Normal pulses.  Pulmonary:     Effort: Pulmonary effort is normal. No respiratory distress.     Breath sounds: Normal breath sounds.  Abdominal:     Abdomen is soft. There is no abdominal tenderness. No rebound or guarding. No distention. Musculoskeletal:        General: No swelling. Normal range of motion.     Cervical back: Normal range of motion and neck supple.  Right toe with ulceration and diffuse swelling.  No significant erythema.  Normal pedal pulses.  No lymphangitis. Skin:    General: Skin is warm and dry.     Capillary Refill: Capillary refill takes less than 2 seconds.     Findings: No rash.  Neurological:     Mental Status: The patient is awake and alert.         ED Results / Procedures / Treatments   Labs (all labs ordered are listed, but only abnormal results are displayed) Labs Reviewed  CBC WITH DIFFERENTIAL/PLATELET - Abnormal; Notable for the following components:      Result Value   RBC 3.44 (*)    Hemoglobin 10.0 (*)    HCT 30.4 (*)    All other components within normal limits  COMPREHENSIVE METABOLIC PANEL WITH GFR - Abnormal; Notable for the following components:   Sodium 130 (*)    Chloride 96 (*)    CO2 21 (*)    Glucose, Bld 346 (*)    BUN 70 (*)    Creatinine, Ser 4.85 (*)    Alkaline Phosphatase 148 (*)    GFR, Estimated 15 (*)    All other components within normal limits  SEDIMENTATION RATE - Abnormal; Notable for the following components:   Sed Rate 73 (*)    All other components within normal limits  CULTURE, BLOOD (ROUTINE X 2)  CULTURE, BLOOD (ROUTINE X 2)  MRSA NEXT GEN BY PCR, NASAL  C-REACTIVE PROTEIN  HEMOGLOBIN A1C  HIV ANTIBODY (ROUTINE TESTING W REFLEX)     EKG     RADIOLOGY I independently reviewed and interpreted imaging and agree with radiologists findings.     PROCEDURES:  Critical Care performed:    Procedures   MEDICATIONS ORDERED IN ED: Medications  methadone  (DOLOPHINE ) 1 MG/1ML solution 100 mg (has no administration in time range)  amLODipine  (NORVASC ) tablet 10 mg (has no administration in time range)  carvedilol  (COREG ) tablet 6.25 mg (has no administration in time range)  cloNIDine  (CATAPRES ) tablet 0.1 mg (has no administration in time range)  calcitRIOL (ROCALTROL) capsule 0.25 mcg (has no administration in time range)  pantoprazole  (PROTONIX ) EC tablet 40 mg (has no administration in time range)  sodium bicarbonate  tablet 650 mg (has no administration in time range)  cyclobenzaprine  (FLEXERIL ) tablet 10 mg (has no administration in time range)  insulin  aspart (novoLOG ) injection 0-20 Units (6 Units Subcutaneous Given 08/28/24 1234)  insulin  aspart (novoLOG ) injection 0-5 Units (has no administration in time range)  insulin  glargine-yfgn (SEMGLEE ) injection 25 Units (has no administration in time range)  insulin  aspart (novoLOG ) injection 6 Units (has no administration in time range)  ondansetron  (ZOFRAN ) tablet 4 mg (has no administration in time range)    Or  ondansetron  (ZOFRAN ) injection 4 mg (has no administration in time range)  acetaminophen  (TYLENOL ) tablet 650 mg (has no administration in time range)    Or  acetaminophen  (TYLENOL ) suppository 650 mg (has no administration in time range)  HYDROcodone-acetaminophen  (NORCO/VICODIN) 5-325 MG per tablet 1-2 tablet (has no administration in time range)  enoxaparin (LOVENOX) injection 30 mg (has no administration in time range)  doxycycline  (VIBRAMYCIN ) 100 mg in sodium chloride  0.9 % 250 mL IVPB (has no administration in time range)  ceFEPIme  (MAXIPIME ) 2 g in sodium chloride  0.9 % 100 mL IVPB (has no administration in time range)  linagliptin (TRADJENTA) tablet 5 mg (has no administration in time range)  gadobutrol  (GADAVIST ) 1 MMOL/ML injection 10 mL (10 mLs Intravenous Contrast Given 08/28/24 0834)  cefTRIAXone   (ROCEPHIN ) 2 g in sodium chloride  0.9 % 100 mL IVPB (0 g Intravenous Stopped 08/28/24 1046)    And  metroNIDAZOLE  (FLAGYL ) IVPB 500 mg (0 mg Intravenous Stopped 08/28/24 1117)    And  vancomycin  (VANCOCIN ) IVPB 1000 mg/200 mL premix (0 mg Intravenous Stopped 08/28/24 1254)     IMPRESSION / MDM / ASSESSMENT AND PLAN / ED COURSE  I reviewed the triage vital signs and the nursing notes.   Differential diagnosis includes, but is not limited to, osteomyelitis, cellulitis, abscess, septic arthritis.  I reviewed the patient's chart.  Patient saw podiatry on 08/16/2024.  He already has left ankle and foot Charcot related bone deterioration.  He was on a several week course of doxycycline  which he completed on 08/26/2024.  He was diagnosed with acute osteomyelitis of the right helix complicated by diabetes and stage IV CKD.  X-ray at that time did reveal cortical erosion suggesting advanced infection, and it was recommended that he undergo surgical amputation of the hallux due to high risk of progression and low success rate of IV antibiotics.  Dr. Magdalen had recommended MRI of the right foot to confirm extent of osteomyelitis, due to plan for left BKA.  Per the note from Dr. Magdalen, recommended urgent evaluation in the emergency department for expedited MRI, with ESR, CRP, WBC, and CMP.  Patient subsequently saw Dr. Marea with vascular surgery on 12/19.  Patient is awake and alert, hemodynamically able and afebrile.  Patient does have an ulceration with diffuse swelling to his right great toe.  Further workup is indicated.  Labs obtained reveal an elevated ESR.  He is also hyperglycemic to 346, though normal anion gap, normal bicarb, not consistent with DKA.  X-ray was obtained in triage with findings consistent with osteomyelitis.  MRI also obtained, and podiatry consulted prior to MRI results.  I consulted Dr. Malvin who saw the patient in the emergency department and agrees with plan for admission and  likely  amputation.  He requested consultation from vascular surgery, and I subsequently consulted Dr. Jama who has agreed to see the patient during his hospitalization.  Patient was started on broad-spectrum antibiotics.  I had ordered vancomycin  given his reported history of MRSA, though patient reportedly declined this.  I consulted the hospitalist for admission, patient was accepted by Dr. Laurita.  Patient's presentation is most consistent with acute presentation with potential threat to life or bodily function.   Clinical Course as of 08/28/24 1310  Tue Aug 28, 2024  [id number] Discussed with Dr. Malvin, plan for admission to hospitalist [JP]  [phone] Also discussed with Dr. Jama with vascular surgery who will see the patient [JP]    Clinical Course User Index [JP] Geriann Lafont E, PA-C     FINAL CLINICAL IMPRESSION(S) / ED DIAGNOSES   Final diagnoses:  Osteomyelitis of right foot, unspecified type (HCC)  Septic arthritis of interphalangeal joint of toe of right foot (HCC)  Hyperglycemia     Rx / DC Orders   ED Discharge Orders     None        Note:  This document was prepared using Dragon voice recognition software and may include unintentional dictation errors.   Ranesha Val E, PA-C 08/28/24 1310    Suzanne Kirsch, MD 08/28/24 1847  "

## 2024-08-28 NOTE — ED Provider Notes (Signed)
 Shared visit   Admission for osteomyelitis.  Patient has already completed a course of doxycycline .  Podiatry following.  Given IV antibiotics.   Suzanne Kirsch, MD 08/28/24 1044

## 2024-08-29 ENCOUNTER — Inpatient Hospital Stay: Payer: MEDICAID | Admitting: Anesthesiology

## 2024-08-29 ENCOUNTER — Encounter
Admission: EM | Disposition: A | Discharge status: Home Health | Payer: Self-pay | Source: Home / Self Care | Attending: Internal Medicine

## 2024-08-29 ENCOUNTER — Ambulatory Visit: Payer: MEDICAID | Admitting: Podiatry

## 2024-08-29 ENCOUNTER — Inpatient Hospital Stay: Payer: MEDICAID

## 2024-08-29 ENCOUNTER — Encounter: Payer: Self-pay | Admitting: Internal Medicine

## 2024-08-29 DIAGNOSIS — E871 Hypo-osmolality and hyponatremia: Secondary | ICD-10-CM | POA: Diagnosis not present

## 2024-08-29 DIAGNOSIS — I10 Essential (primary) hypertension: Secondary | ICD-10-CM | POA: Diagnosis not present

## 2024-08-29 DIAGNOSIS — D638 Anemia in other chronic diseases classified elsewhere: Secondary | ICD-10-CM | POA: Diagnosis not present

## 2024-08-29 DIAGNOSIS — E669 Obesity, unspecified: Secondary | ICD-10-CM | POA: Diagnosis not present

## 2024-08-29 DIAGNOSIS — E1065 Type 1 diabetes mellitus with hyperglycemia: Secondary | ICD-10-CM | POA: Diagnosis not present

## 2024-08-29 DIAGNOSIS — N184 Chronic kidney disease, stage 4 (severe): Secondary | ICD-10-CM | POA: Diagnosis not present

## 2024-08-29 DIAGNOSIS — F1190 Opioid use, unspecified, uncomplicated: Secondary | ICD-10-CM

## 2024-08-29 DIAGNOSIS — E875 Hyperkalemia: Secondary | ICD-10-CM

## 2024-08-29 DIAGNOSIS — M869 Osteomyelitis, unspecified: Secondary | ICD-10-CM | POA: Diagnosis not present

## 2024-08-29 HISTORY — PX: AMPUTATION TOE: SHX6595

## 2024-08-29 LAB — GLUCOSE, CAPILLARY
Glucose-Capillary: 222 mg/dL — ABNORMAL HIGH (ref 70–99)
Glucose-Capillary: 320 mg/dL — ABNORMAL HIGH (ref 70–99)
Glucose-Capillary: 219 mg/dL — ABNORMAL HIGH (ref 70–99)
Glucose-Capillary: 219 mg/dL — ABNORMAL HIGH (ref 70–99)

## 2024-08-29 LAB — BASIC METABOLIC PANEL WITH GFR
Anion gap: 12 (ref 5–15)
BUN: 72 mg/dL — ABNORMAL HIGH (ref 6–20)
CO2: 21 mmol/L — ABNORMAL LOW (ref 22–32)
Calcium: 9.3 mg/dL (ref 8.9–10.3)
Chloride: 101 mmol/L (ref 98–111)
Creatinine, Ser: 4.7 mg/dL — ABNORMAL HIGH (ref 0.61–1.24)
GFR, Estimated: 16 mL/min — ABNORMAL LOW
Glucose, Bld: 186 mg/dL — ABNORMAL HIGH (ref 70–99)
Potassium: 5.3 mmol/L — ABNORMAL HIGH (ref 3.5–5.1)
Sodium: 134 mmol/L — ABNORMAL LOW (ref 135–145)

## 2024-08-29 LAB — CBC
HCT: 32.1 % — ABNORMAL LOW (ref 39.0–52.0)
Hemoglobin: 10.4 g/dL — ABNORMAL LOW (ref 13.0–17.0)
MCH: 29.3 pg (ref 26.0–34.0)
MCHC: 32.4 g/dL (ref 30.0–36.0)
MCV: 90.4 fL (ref 80.0–100.0)
Platelets: 257 K/uL (ref 150–400)
RBC: 3.55 MIL/uL — ABNORMAL LOW (ref 4.22–5.81)
RDW: 12.4 % (ref 11.5–15.5)
WBC: 9.8 K/uL (ref 4.0–10.5)
nRBC: 0 % (ref 0.0–0.2)

## 2024-08-29 LAB — VANCOMYCIN, RANDOM: Vancomycin Rm: 25 ug/mL

## 2024-08-29 LAB — HIV ANTIBODY (ROUTINE TESTING W REFLEX): HIV Screen 4th Generation wRfx: NONREACTIVE

## 2024-08-29 SURGERY — AMPUTATION, TOE
Anesthesia: General | Site: Toe | Laterality: Right

## 2024-08-29 MED ORDER — PROPOFOL 10 MG/ML IV BOLUS
INTRAVENOUS | Status: DC | PRN
  Administered 2024-08-29: 30 mg via INTRAVENOUS
  Administered 2024-08-29: 50 mg via INTRAVENOUS
  Administered 2024-08-29: 20 mg via INTRAVENOUS

## 2024-08-29 MED ORDER — OXYCODONE HCL 5 MG PO TABS
5.0000 mg | ORAL_TABLET | Freq: Once | ORAL | Status: AC | PRN
  Administered 2024-08-29: 5 mg via ORAL

## 2024-08-29 MED ORDER — LIDOCAINE HCL (CARDIAC) PF 100 MG/5ML IV SOSY
PREFILLED_SYRINGE | INTRAVENOUS | Status: DC | PRN
  Administered 2024-08-29: 40 mg via INTRAVENOUS

## 2024-08-29 MED ORDER — ONDANSETRON HCL 4 MG/2ML IJ SOLN
INTRAMUSCULAR | Status: DC | PRN
  Administered 2024-08-29: 4 mg via INTRAVENOUS

## 2024-08-29 MED ORDER — FENTANYL CITRATE (PF) 100 MCG/2ML IJ SOLN
INTRAMUSCULAR | Status: DC | PRN
  Administered 2024-08-29 (×2): 25 ug via INTRAVENOUS

## 2024-08-29 MED ORDER — INSULIN ASPART 100 UNIT/ML IJ SOLN
0.0000 [IU] | Freq: Every day | INTRAMUSCULAR | Status: DC
  Administered 2024-08-29: 2 [IU] via SUBCUTANEOUS
  Filled 2024-08-29: qty 2

## 2024-08-29 MED ORDER — PATIROMER SORBITEX CALCIUM 8.4 G PO PACK
16.8000 g | PACK | Freq: Every day | ORAL | Status: DC
  Filled 2024-08-29: qty 2

## 2024-08-29 MED ORDER — FENTANYL CITRATE (PF) 100 MCG/2ML IJ SOLN: 25.0000 ug | INTRAMUSCULAR | Status: DC | PRN

## 2024-08-29 MED ORDER — SODIUM ZIRCONIUM CYCLOSILICATE 10 G PO PACK
10.0000 g | PACK | Freq: Two times a day (BID) | ORAL | Status: DC
  Administered 2024-08-29 – 2024-08-30 (×2): 10 g via ORAL
  Filled 2024-08-29 (×2): qty 1

## 2024-08-29 MED ORDER — FENTANYL CITRATE (PF) 100 MCG/2ML IJ SOLN
INTRAMUSCULAR | Status: AC
  Filled 2024-08-29: qty 2

## 2024-08-29 MED ORDER — INSULIN ASPART 100 UNIT/ML IJ SOLN
INTRAMUSCULAR | Status: AC
  Filled 2024-08-29: qty 6

## 2024-08-29 MED ORDER — INSULIN GLARGINE 100 UNIT/ML ~~LOC~~ SOLN
25.0000 [IU] | Freq: Two times a day (BID) | SUBCUTANEOUS | Status: DC
  Administered 2024-08-29 – 2024-08-30 (×3): 25 [IU] via SUBCUTANEOUS
  Filled 2024-08-29 (×3): qty 0.25

## 2024-08-29 MED ORDER — LIDOCAINE HCL (PF) 2 % IJ SOLN
INTRAMUSCULAR | Status: AC
  Filled 2024-08-29: qty 5

## 2024-08-29 MED ORDER — LIDOCAINE HCL (PF) 1 % IJ SOLN
INTRAMUSCULAR | Status: AC
  Filled 2024-08-29: qty 30

## 2024-08-29 MED ORDER — PROPOFOL 500 MG/50ML IV EMUL
INTRAVENOUS | Status: DC | PRN
  Administered 2024-08-29: 125 ug/kg/min via INTRAVENOUS

## 2024-08-29 MED ORDER — OXYCODONE HCL 5 MG/5ML PO SOLN: 5.0000 mg | Freq: Once | ORAL | Status: AC | PRN

## 2024-08-29 MED ORDER — SODIUM CHLORIDE 0.9 % IV SOLN: INTRAVENOUS | Status: DC | PRN

## 2024-08-29 MED ORDER — INSULIN ASPART 100 UNIT/ML IJ SOLN
0.0000 [IU] | Freq: Three times a day (TID) | INTRAMUSCULAR | Status: DC
  Administered 2024-08-29: 3 [IU] via SUBCUTANEOUS
  Administered 2024-08-29: 7 [IU] via SUBCUTANEOUS
  Administered 2024-08-30: 2 [IU] via SUBCUTANEOUS
  Filled 2024-08-29: qty 7
  Filled 2024-08-29: qty 2
  Filled 2024-08-29: qty 5

## 2024-08-29 MED ORDER — DOXYCYCLINE HYCLATE 100 MG PO TABS
100.0000 mg | ORAL_TABLET | Freq: Two times a day (BID) | ORAL | Status: DC
  Administered 2024-08-29 – 2024-08-30 (×3): 100 mg via ORAL
  Filled 2024-08-29 (×4): qty 1

## 2024-08-29 MED ORDER — INSULIN ASPART 100 UNIT/ML IJ SOLN
2.0000 [IU] | Freq: Three times a day (TID) | INTRAMUSCULAR | Status: DC
  Administered 2024-08-29 – 2024-08-30 (×3): 2 [IU] via SUBCUTANEOUS
  Filled 2024-08-29 (×2): qty 2

## 2024-08-29 MED ORDER — INSULIN ASPART 100 UNIT/ML IJ SOLN: 6.0000 [IU] | Freq: Once | INTRAMUSCULAR | Status: DC

## 2024-08-29 MED ORDER — MIDAZOLAM HCL 2 MG/2ML IJ SOLN
INTRAMUSCULAR | Status: AC
  Filled 2024-08-29: qty 2

## 2024-08-29 MED ORDER — PROPOFOL 10 MG/ML IV BOLUS
INTRAVENOUS | Status: AC
  Filled 2024-08-29: qty 20

## 2024-08-29 MED ORDER — OXYCODONE HCL 5 MG PO TABS
ORAL_TABLET | ORAL | Status: AC
  Filled 2024-08-29: qty 1

## 2024-08-29 MED ORDER — PROPOFOL 1000 MG/100ML IV EMUL
INTRAVENOUS | Status: AC
  Filled 2024-08-29: qty 100

## 2024-08-29 MED ORDER — LIDOCAINE HCL 1 % IJ SOLN
INTRAMUSCULAR | Status: DC | PRN
  Administered 2024-08-29: 10 mL via INTRAMUSCULAR

## 2024-08-29 MED ORDER — MIDAZOLAM HCL (PF) 2 MG/2ML IJ SOLN
INTRAMUSCULAR | Status: DC | PRN
  Administered 2024-08-29: 2 mg via INTRAVENOUS

## 2024-08-29 SURGICAL SUPPLY — 45 items
BLADE MED AGGRESSIVE (BLADE) IMPLANT
BLADE OSC/SAGITTAL MD 5.5X18 (BLADE) IMPLANT
BLADE SURG 15 STRL LF DISP TIS (BLADE) ×2 IMPLANT
BLADE SURG MINI STRL (BLADE) IMPLANT
BNDG ELASTIC 4INX 5YD STR LF (GAUZE/BANDAGES/DRESSINGS) ×1 IMPLANT
BNDG ESMARCH 4X12 STRL LF (GAUZE/BANDAGES/DRESSINGS) IMPLANT
BNDG GAUZE DERMACEA FLUFF 4 (GAUZE/BANDAGES/DRESSINGS) ×1 IMPLANT
CNTNR URN SCR LID CUP LEK RST (MISCELLANEOUS) IMPLANT
CUFF TOURN SGL QUICK 12 (TOURNIQUET CUFF) IMPLANT
CUFF TOURN SGL QUICK 18X4 (TOURNIQUET CUFF) IMPLANT
DRAPE FLUOR MINI C-ARM 54X84 (DRAPES) IMPLANT
DRSG EMULSION OIL 3X3 NADH (GAUZE/BANDAGES/DRESSINGS) IMPLANT
DRSG TELFA 3X8 NADH STRL (GAUZE/BANDAGES/DRESSINGS) ×1 IMPLANT
DURAPREP 26ML APPLICATOR (WOUND CARE) IMPLANT
ELECTRODE REM PT RTRN 9FT ADLT (ELECTROSURGICAL) ×1 IMPLANT
GAUZE PAD ABD 8X10 STRL (GAUZE/BANDAGES/DRESSINGS) IMPLANT
GAUZE SPONGE 4X4 12PLY STRL (GAUZE/BANDAGES/DRESSINGS) ×1 IMPLANT
GAUZE STRETCH 2X75IN STRL (MISCELLANEOUS) IMPLANT
GAUZE XEROFORM 1X8 LF (GAUZE/BANDAGES/DRESSINGS) ×1 IMPLANT
GLOVE BIOGEL PI IND STRL 7.5 (GLOVE) ×1 IMPLANT
GLOVE SURG SYN 7.5 PF PI (GLOVE) ×1 IMPLANT
GOWN STRL REUS W/ TWL LRG LVL3 (GOWN DISPOSABLE) IMPLANT
GOWN STRL REUS W/ TWL XL LVL3 (GOWN DISPOSABLE) ×1 IMPLANT
HANDPIECE VERSAJET DEBRIDEMENT (MISCELLANEOUS) IMPLANT
KIT TURNOVER KIT A (KITS) ×1 IMPLANT
LABEL OR SOLS (LABEL) IMPLANT
MANIFOLD NEPTUNE II (INSTRUMENTS) ×1 IMPLANT
NEEDLE FILTER BLUNT 18X1 1/2 (NEEDLE) IMPLANT
NEEDLE HYPO 25X1 1.5 SAFETY (NEEDLE) ×1 IMPLANT
NS IRRIG 500ML POUR BTL (IV SOLUTION) ×1 IMPLANT
PACK EXTREMITY ARMC (MISCELLANEOUS) ×1 IMPLANT
PAD ABD DERMACEA PRESS 5X9 (GAUZE/BANDAGES/DRESSINGS) IMPLANT
PAD PREP OB/GYN DISP 24X41 (PERSONAL CARE ITEMS) ×1 IMPLANT
PENCIL SMOKE EVACUATOR (MISCELLANEOUS) ×1 IMPLANT
SOL .9 NS 3000ML IRR UROMATIC (IV SOLUTION) IMPLANT
SOLN STERILE WATER 500 ML (IV SOLUTION) IMPLANT
SOLUTION PREP PVP 2OZ (MISCELLANEOUS) IMPLANT
STAPLER SKIN PROX 35W (STAPLE) IMPLANT
STOCKINETTE 48X4 2 PLY STRL (GAUZE/BANDAGES/DRESSINGS) ×1 IMPLANT
SUT MNCRL AB 3-0 PS2 27 (SUTURE) IMPLANT
SUT PROLENE 3 0 PS 2 (SUTURE) ×1 IMPLANT
SWAB CULTURE AMIES ANAERIB BLU (MISCELLANEOUS) IMPLANT
SYR 10ML LL (SYRINGE) ×1 IMPLANT
TIP FAN IRRIG PULSAVAC PLUS (DISPOSABLE) IMPLANT
TRAP FLUID SMOKE EVACUATOR (MISCELLANEOUS) ×1 IMPLANT

## 2024-08-29 NOTE — Inpatient Diabetes Management (Signed)
 Inpatient Diabetes Program Recommendations  AACE/ADA: New Consensus Statement on Inpatient Glycemic Control (2015)  Target Ranges:  Prepandial:   less than 140 mg/dL      Peak postprandial:   less than 180 mg/dL (1-2 hours)      Critically ill patients:  140 - 180 mg/dL    Latest Reference Range & Units 08/28/24 17:07 08/28/24 20:20 08/29/24 08:02 08/29/24 11:32  Glucose-Capillary 70 - 99 mg/dL 874 (H) 716 (H)  3 units Novolog   25 units Lantus  222 (H)  2 units Novolog  @1000  320 (H)  7 units Novolog   25 units Lantus   (H): Data is abnormally high     Home DM Meds: NPH 24 units BID Humulin R  SSI  Current Orders: Lantus  25 units daily Novolog  Sensitive Correction Scale/ SSI (0-9 units) TID AC + HS Novolog  2 units TID with meals Tradjenta 5 mg daily     MD- Please consider:  1. Increase Lantus  to 30 units daily  2. Increase Novolog  Meal Coverage to 4 units TID with meals    --Will follow patient during hospitalization--  Adina Rudolpho Arrow RN, MSN, CDCES Diabetes Coordinator Inpatient Glycemic Control Team Team Pager: [phone] (8a-5p)

## 2024-08-29 NOTE — Plan of Care (Signed)
  Problem: Education: Goal: Ability to describe self-care measures that may prevent or decrease complications (Diabetes Survival Skills Education) will improve Outcome: Progressing   Problem: Coping: Goal: Ability to adjust to condition or change in health will improve Outcome: Progressing

## 2024-08-29 NOTE — Assessment & Plan Note (Signed)
 Sodium only 1 point less than normal range.

## 2024-08-29 NOTE — Transfer of Care (Signed)
 Immediate Anesthesia Transfer of Care Note  Patient: Jason Francis  Procedure(s) Performed: RIGHT AMPUTATION, TOE (Right: Toe)  Patient Location: PACU  Anesthesia Type:General  Level of Consciousness: drowsy  Airway & Oxygen Therapy: Patient Spontanous Breathing and Patient connected to face mask oxygen  Post-op Assessment: Report given to RN and Post -op Vital signs reviewed and stable  Post vital signs: Reviewed and stable  Last Vitals:  Vitals Value Taken Time  BP 105/61 08/29/24 08:01  Temp 97.8 08/29/24   0803  Pulse 63 08/29/24 08:03  Resp 16 08/29/24 08:03  SpO2 100 % 08/29/24 08:03  Vitals shown include unfiled device data.  Last Pain:  Vitals:   08/29/24 0659  TempSrc: Temporal  PainSc: 0-No pain      Patients Stated Pain Goal: 0 (08/28/24 1341)  Complications: No notable events documented.

## 2024-08-29 NOTE — Anesthesia Preprocedure Evaluation (Signed)
"                                    Anesthesia Evaluation  Patient identified by MRN, date of birth, ID band Patient awake    Reviewed: Allergy & Precautions, H&P , NPO status , Patient's Chart, lab work & pertinent test results, reviewed documented beta blocker date and time   History of Anesthesia Complications Negative for: history of anesthetic complications  Airway Mallampati: III  TM Distance: >3 FB Neck ROM: full    Dental  (+) Dental Advidsory Given, Missing, Poor Dentition, Chipped   Pulmonary neg shortness of breath, neg COPD, neg recent URI, Current Smoker and Patient abstained from smoking.   Pulmonary exam normal breath sounds clear to auscultation       Cardiovascular Exercise Tolerance: Good hypertension, (-) angina (-) Past MI and (-) Cardiac Stents negative cardio ROS Normal cardiovascular exam(-) dysrhythmias (-) Valvular Problems/Murmurs Rhythm:regular Rate:Normal     Neuro/Psych negative neurological ROS  negative psych ROS   GI/Hepatic ,GERD  ,,(+) Hepatitis -, C  Endo/Other  diabetes, Type 1    Renal/GU Renal disease     Musculoskeletal   Abdominal   Peds  Hematology  (+) Blood dyscrasia, anemia   Anesthesia Other Findings Past Medical History: No date: Anemia No date: 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) No date: CKD (chronic kidney disease), stage IV (HCC) No date: Diabetic Charcot foot (HCC) No date: GERD (gastroesophageal reflux disease) No date: Hepatitis C No date: Hypertension No date: MRSA infection     Comment:  in blood per patient 02/14/2022: Nausea & vomiting No date: Polysubstance abuse (HCC)     Comment:  a.) THC + cocaine + TCA + IVDU No date: Secondary hyperparathyroidism (HCC) No date: 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)   Reproductive/Obstetrics negative OB ROS                              Anesthesia Physical Anesthesia Plan  ASA:  3  Anesthesia Plan: General   Post-op Pain Management:    Induction: Intravenous  PONV Risk Score and Plan: 2 and Ondansetron , Propofol  infusion, TIVA and Midazolam   Airway Management Planned: Natural Airway and Nasal Cannula  Additional Equipment:   Intra-op Plan:   Post-operative Plan:   Informed Consent: I have reviewed the patients History and Physical, chart, labs and discussed the procedure including the risks, benefits and alternatives for the proposed anesthesia with the patient or authorized representative who has indicated his/her understanding and acceptance.     Dental Advisory Given  Plan Discussed with: Anesthesiologist, CRNA and Surgeon  Anesthesia Plan Comments: (Patient consented for risks of anesthesia including but not limited to:  - adverse reactions to medications - risk of airway placement if required - damage to eyes, teeth, lips or other oral mucosa - nerve damage due to positioning  - sore throat or hoarseness - Damage to heart, brain, nerves, lungs, other parts of body or loss of life  Patient voiced understanding and assent.)        Anesthesia Quick Evaluation  "

## 2024-08-29 NOTE — Assessment & Plan Note (Addendum)
 Amputation of right great toe at MPJ level by Dr. Newton Abu on 12/31.  Pain control.  Antibiotics switched over to doxycycline .  PT and OT consultation.  Wheelchair ordered.

## 2024-08-29 NOTE — Assessment & Plan Note (Signed)
 Class I with a BMI of 32.99

## 2024-08-29 NOTE — Assessment & Plan Note (Addendum)
 Uncontrolled with hyperglycemia.  Patient will go back to his usual regimen at home.

## 2024-08-29 NOTE — Assessment & Plan Note (Signed)
On chronic methadone

## 2024-08-29 NOTE — Hospital Course (Signed)
 33 y.o. male with medical history significant of IDDM, CKD stage IV, diabetic Charcot foot on the left side, hepatitis C, HTN, presented with worsening of right big toe infection.   Patient has a chronic ulcer on the inner side of left big toe, developed about 1 month ago, initially attributed to bunion problem.  She went to podiatry 1 week ago, podiatrist suspected osteomyelitis and recommended patient come to hospital for IV antibiotics.  Patient however declined and instead patient was started on doxycycline .  Patient however has been seen worsening of swelling and discharge from the wound however no much pain probably because he has significant diabetic neuropathy at the same time.  Denies any fever or chills.  Today he decided to come to the ED for worsening of infection.   ED Course: Afebrile, nontachycardic blood pressure 148/80 O2 saturation 100% on room air.  MRI positive for septic arthritis of the interphalangeal joint of the great toe with osteomyelitis.  Blood work showed WBC 9.0 hemoglobin 10.0 BUN 70 creatinine 4.8 compared to baseline 4.0-1.6 glucose 346 sodium 130 potassium 5.0.  12/31.  Patient with right first toe amputation today by Dr. Malvin. 08/30/2024.  Patient stable for discharge home on doxycycline  for another 5 days.  Follow-up with Dr. Malvin 1 week.  Leave dressing intact.  Will also prescribe Lokelma 3 times a week upon going home.

## 2024-08-29 NOTE — Evaluation (Signed)
 Physical Therapy Evaluation Patient Details Name: Jason Francis MRN: [id number] DOB: 1990-10-26 Today's Date: 08/29/2024  History of Present Illness  Pt is a 33 yo male that presented to the ED for toe pain, s/p R toe amputation. PMH of smoking, HTN, DM, diabetic charcot foot, hep C.  Clinical Impression  Patient alert, agreeable to PT, reported mild R foot pain. At baseline the pt is independent, lives with his fiance and mother. modI for bed mobility, transfers, and ambulation. Able to demonstrate use of RW safely, step to pattern. Educated on weight bearing status, post op shoe, and elevation of RLE to decrease swelling. Overall the patient would benefit from further skilled PT intervention to return to PLOF.         If plan is discharge home, recommend the following: Assistance with cooking/housework;Assist for transportation;Help with stairs or ramp for entrance   Can travel by private vehicle    yes    Equipment Recommendations Wheelchair (measurements PT);Wheelchair cushion (measurements PT)  Recommendations for Other Services       Functional Status Assessment Patient has had a recent decline in their functional status and demonstrates the ability to make significant improvements in function in a reasonable and predictable amount of time.     Precautions / Restrictions Precautions Precautions: Fall Recall of Precautions/Restrictions: Intact Restrictions Weight Bearing Restrictions Per Provider Order: Yes RLE Weight Bearing Per Provider Order: Weight bearing as tolerated Other Position/Activity Restrictions: in post op shoe      Mobility  Bed Mobility Overal bed mobility: Modified Independent                  Transfers Overall transfer level: Modified independent                      Ambulation/Gait Ambulation/Gait assistance: Modified independent (Device/Increase time) Gait Distance (Feet): 50 Feet Assistive device: Rolling walker (2 wheels)          General Gait Details: step to pattern, no LOB  Stairs            Wheelchair Mobility     Tilt Bed    Modified Rankin (Stroke Patients Only)       Balance Overall balance assessment: Needs assistance Sitting-balance support: Feet supported Sitting balance-Leahy Scale: Good     Standing balance support: Bilateral upper extremity supported Standing balance-Leahy Scale: Good                               Pertinent Vitals/Pain Pain Assessment Pain Assessment: Faces Faces Pain Scale: Hurts a little bit    Home Living Family/patient expects to be discharged to:: Private residence Living Arrangements: Spouse/significant other;Parent Available Help at Discharge: Family Type of Home: House Home Access: Stairs to enter   Secretary/administrator of Steps: 2   Home Layout: One level Home Equipment: Agricultural Consultant (2 wheels);Crutches (knee scooter)      Prior Function Prior Level of Function : Independent/Modified Independent                     Extremity/Trunk Assessment   Upper Extremity Assessment Upper Extremity Assessment: Overall WFL for tasks assessed    Lower Extremity Assessment Lower Extremity Assessment:  (charcot foot on LLE, RLE post op shoe)       Communication        Cognition Arousal: Alert Behavior During Therapy: WFL for tasks assessed/performed   PT - Cognitive  impairments: No apparent impairments                         Following commands: Intact       Cueing       General Comments      Exercises     Assessment/Plan    PT Assessment Patient needs continued PT services  PT Problem List Decreased strength;Decreased activity tolerance;Decreased range of motion;Decreased balance;Decreased mobility       PT Treatment Interventions DME instruction;Balance training;Neuromuscular re-education;Gait training;Functional mobility training;Patient/family education;Therapeutic  activities;Therapeutic exercise    PT Goals (Current goals can be found in the Care Plan section)  Acute Rehab PT Goals Patient Stated Goal: to return to PLOF PT Goal Formulation: With patient Time For Goal Achievement: 09/12/24 Potential to Achieve Goals: Good    Frequency Min 1X/week     Co-evaluation               AM-PAC PT 6 Clicks Mobility  Outcome Measure Help needed turning from your back to your side while in a flat bed without using bedrails?: None Help needed moving from lying on your back to sitting on the side of a flat bed without using bedrails?: None Help needed moving to and from a bed to a chair (including a wheelchair)?: None Help needed standing up from a chair using your arms (e.g., wheelchair or bedside chair)?: None Help needed to walk in hospital room?: A Little Help needed climbing 3-5 steps with a railing? : A Little 6 Click Score: 22    End of Session   Activity Tolerance: Patient tolerated treatment well Patient left: in bed;with call bell/phone within reach Nurse Communication: Mobility status PT Visit Diagnosis: Other abnormalities of gait and mobility (R26.89);Difficulty in walking, not elsewhere classified (R26.2);Muscle weakness (generalized) (M62.81)    Time: 8675-8659 PT Time Calculation (min) (ACUTE ONLY): 16 min   Charges:   PT Evaluation $PT Eval Low Complexity: 1 Low PT Treatments $Therapeutic Activity: 8-22 mins PT General Charges $$ ACUTE PT VISIT: 1 Visit         Doyal Shams PT, DPT 4:27 PM,08/29/2024

## 2024-08-29 NOTE — Op Note (Addendum)
 Full Operative Report  Date of Operation: 7:19 AM, 08/29/2024   Patient: Jason Francis - 33 y.o. male  Surgeon: Malvin Marsa FALCON, DPM   Assistant: None  Diagnosis: Osteomyelitis of the right great toe  Procedure:  1. Amputation right great toe at MPJ level    Anesthesia: Anesthesia type not filed in the log.  No responsible provider has been recorded for the case.  CRNA: [place], [place], CRNA   Estimated Blood Loss: Minimal   Hemostasis: 1) Anatomical dissection, mechanical compression, electrocautery 2) No tourniquet was used  Implants: * No implants in log *  Materials: prolene 3-0   Injectables: 1) Pre-operatively: 10 cc of 50:50 mixture 1%lidocaine  plain and 0.5% marcaine  plain 2) Post-operatively: None   Specimens: - Pathology: Right great toe  - Microbiology: tissue culture right great toe   Antibiotics: IV antibiotics given per schedule on the floor  Drains: None  Complications: Patient tolerated the procedure well without complication.   Operative findings: As below in detailed report  Indications for Procedure: Jason Francis presents to Malvin Marsa FALCON, DPM with a chief complaint of right great toe ulceration with osteomyelitis and septic IPJ. The patient has failed conservative treatments of various modalities. At this time the patient has elected to proceed with surgical correction. All alternatives, risks, and complications of the procedures were thoroughly explained to the patient. Patient exhibits appropriate understanding of all discussion points and informed consent was signed and obtained in the chart with no guarantees to surgical outcome given or implied.  Description of Procedure: Patient was brought to the operating room. Patient remained on their hospital bed in the supine position. A surgical timeout was performed and all members of the operating room, the procedure, and the surgical site were identified. anesthesia occurred as per  anesthesia record. Local anesthetic as previously described was then injected about the operative field in a local infiltrative block.  The operative lower extremity as noted above was then prepped and draped in the usual sterile manner. The following procedure then began.  Attention was directed to the first digit on the RIGHT foot. A full-thickness incision encompassing the entire digit was made using a #15 blade. Dissection was carried down to bone. The toe was secured with a towel clamp, further dissected in its entirety, and disarticulated at the MPJ and passed to the back table as a gross specimen. This was then labled and sent to pathology. The bone was noted to be soft and eroded, and consistent with osteomyelitis. A tissue culture was harvested with rongeur and sent for micro. All remaining necrotic and devitalized soft tissue structures were visualized and dissected away using sharp and dull dissection. Care was taken to protect all neurovascular structures throughout the dissection. All bleeders were cauterized as necessary.  The area was then flushed with copious amounts of sterile saline. Then using the suture materials previously described, the site was closed in anatomic layers and the skin was well approximated under minimal tension.   The surgical site was then dressed with xeroform 4x4 kerlix and ace wrap. The patient tolerated both the procedure and anesthesia well with vital signs stable throughout. The patient was transferred in good condition and all vital signs stable  from the OR to recovery under the discretion of anesthesia.  Condition: Vital signs stable, neurovascular status unchanged from preoperative   Surgical plan:  Expect clean margin, rec 5 days doxycylcline from DC. Stable for dc home later today or tmrw. WBAT in post op shoe. Follow  up next week in [place] office leave dressing clean dry intact until then.   The patient will be WBAT in a Post op shoe to the  operative limb until further instructed. The dressing is to remain clean, dry, and intact. Will continue to follow unless noted elsewhere.   Marsa Honour, DPM [facility]

## 2024-08-29 NOTE — Progress Notes (Signed)
 " Progress Note   Patient: Jason Francis FMW:[id number] DOB: [birth year] DOA: 08/28/2024     1 DOS: the patient was seen and examined on 08/29/2024   Brief hospital course: 33 y.o. male with medical history significant of IDDM, CKD stage IV, diabetic Charcot foot on the left side, hepatitis C, HTN, presented with worsening of right big toe infection.   Patient has a chronic ulcer on the inner side of left big toe, developed about 1 month ago, initially attributed to bunion problem.  She went to podiatry 1 week ago, podiatrist suspected osteomyelitis and recommended patient come to hospital for IV antibiotics.  Patient however declined and instead patient was started on doxycycline .  Patient however has been seen worsening of swelling and discharge from the wound however no much pain probably because he has significant diabetic neuropathy at the same time.  Denies any fever or chills.  Today he decided to come to the ED for worsening of infection.   ED Course: Afebrile, nontachycardic blood pressure 148/80 O2 saturation 100% on room air.  MRI positive for septic arthritis of the interphalangeal joint of the great toe with osteomyelitis.  Blood work showed WBC 9.0 hemoglobin 10.0 BUN 70 creatinine 4.8 compared to baseline 4.0-1.6 glucose 346 sodium 130 potassium 5.0.  12/31.  Patient with right first toe amputation today by Dr. Malvin.  Assessment and Plan: * Osteomyelitis of great toe of right foot (HCC) Amputation of right great toe at MPJ level by Dr. Newton Abu on 12/31.  Pain control.  Antibiotics switched over to doxycycline .  PT and OT consultation.  Wheelchair ordered.  Insulin  dependent type 1 diabetes mellitus (HCC) Uncontrolled with hyperglycemia.  Patient on 25 units of long-acting insulin  and sliding scale.  CKD (chronic kidney disease), stage IV (HCC) Seen by nephrology and can follow-up as outpatient.  Opioid use disorder On chronic methadone   Essential hypertension On  amlodipine  Coreg  and clonidine   Obesity (BMI 30-39.9) Class I with a BMI of 32.99  Anemia of chronic disease Hemoglobin 10.4  Hyperkalemia On Veltassa  Hyponatremia Sodium only 1 point less than normal range.        Subjective: Patient seen after surgery.  Feels okay.  Came in with osteomyelitis.  Toe amputation today  Physical Exam: Vitals:   08/29/24 0830 08/29/24 0907 08/29/24 1139 08/29/24 1502  BP: 125/81 (!) 141/91 (!) 140/84 137/84  Pulse: 69 70 71 66  Resp: 18 18 18 18   Temp: (!) 97.3 F (36.3 C) 98.6 F (37 C) 98.3 F (36.8 C) 98.2 F (36.8 C)  TempSrc:      SpO2: 100% 99% 99% 100%  Weight:      Height:       Physical Exam HENT:     Head: Normocephalic.     Mouth/Throat:     Pharynx: No oropharyngeal exudate.  Eyes:     General: Lids are normal.     Conjunctiva/sclera: Conjunctivae normal.  Cardiovascular:     Rate and Rhythm: Normal rate and regular rhythm.     Heart sounds: Normal heart sounds, S1 normal and S2 normal.  Pulmonary:     Breath sounds: No decreased breath sounds, wheezing, rhonchi or rales.  Abdominal:     Palpations: Abdomen is soft.     Tenderness: There is no abdominal tenderness.  Musculoskeletal:     Right lower leg: No swelling.     Left lower leg: No swelling.  Skin:    General: Skin is warm.  Findings: No rash.  Neurological:     Mental Status: He is alert and oriented to person, place, and time.     Data Reviewed: Creatinine 4.7, sodium 134, potassium 5.3, white blood cell count 9.8, hemoglobin 10.4, platelet count 257 Family Communication: Fianc at bedside  Disposition: Status is: Inpatient Remains inpatient appropriate because: Potential discharge tomorrow.  Patient went to the operating room today  Planned Discharge Destination: Home with Home Health    Time spent: 28 minutes  Author: Charlie Patterson, MD 08/29/2024 3:11 PM  For on call review [email or web address].  "

## 2024-08-29 NOTE — Assessment & Plan Note (Signed)
 Seen by nephrology and can follow-up as outpatient.

## 2024-08-29 NOTE — TOC CM/SW Note (Signed)
 Ordered wheelchair through [employer].  Lauraine Carpen, CSW [phone]

## 2024-08-29 NOTE — Assessment & Plan Note (Signed)
 On Veltassa

## 2024-08-29 NOTE — Assessment & Plan Note (Signed)
 On amlodipine  Coreg  and clonidine 

## 2024-08-29 NOTE — Progress Notes (Signed)
 Pharmacy Antibiotic Note  Jason Francis is a 33 y.o. male admitted on 08/28/2024 with toe osteomyelitis.  Pharmacy has been consulted for vancomycin  dosing.  -also on Cefepime  2gm IV q24h -CKD stage 4   Scr 4.85 >> 4.70   no hemodialysis at this time per nephrology note 08/18/24 -recent doxycycline   Plan: Patient received Loading dose of :Vancomycin  1000mg  IV 12/30 @ 1201 and 1000mg  12/30@ 1649,   for a total of 2000mg   - 12/31 @ 0555 Vancomcyin random level= 25 mcg/ml  Will plan to redose Vancomycin  when level < 20 mcg/ml Will recheck random Vancomycin  level tomorrow am 1/01 Further vancomycin  dosing based on level  Continue to assess renal fxn, cultures, length of therapy, etc   Height: 5' 8 (172.7 cm) Weight: 98.4 kg (217 lb) IBW/kg (Calculated) : 68.4  Temp (24hrs), Avg:97.9 F (36.6 C), Min:97.6 F (36.4 C), Max:98.2 F (36.8 C)  Recent Labs  Lab 08/28/24 0847 08/29/24 0555  WBC 9.8 9.8  CREATININE 4.85* 4.70*  VANCORANDOM  --  25    Estimated Creatinine Clearance: 24.9 mL/min (A) (by C-G formula based on SCr of 4.7 mg/dL (H)).    Allergies[1]  Antimicrobials this admission: Ceftriaxone /metronidazole  x 1  12/30 Vancomycin   12/30 >>   Cefepime  12/30>>  Dose adjustments this admission:    Microbiology results: 12/30 BCx: NG <24hr   UCx:      Sputum:    12/30 MRSA PCR: pending 12/31 surgical wound cx: pending  Thank you for allowing pharmacy to be a part of this patients care.  Allean Haas PharmD Clinical Pharmacist 08/29/2024       [1]  Allergies Allergen Reactions   Augmentin [Amoxicillin-Pot Clavulanate] Nausea And Vomiting   Sulfa Antibiotics Nausea And Vomiting

## 2024-08-29 NOTE — Progress Notes (Signed)
 History and Physical Interval Note:  08/29/2024 7:09 AM  Jason Francis  has presented today for surgery, with the diagnosis of osteomyelitis of the right great toe.  The various methods of treatment have been discussed with the patient and family. After consideration of risks, benefits and other options for treatment, the patient has consented to   Procedures with comments: AMPUTATION, TOE (Right) - right great toe amputation as a surgical intervention.  The patient's history has been reviewed, patient examined, no change in status, stable for surgery.  I have reviewed the patient's chart and labs.  Questions were answered to the patient's satisfaction.     Marsa FALCON Linnaea Ahn

## 2024-08-29 NOTE — TOC CM/SW Note (Signed)
" °  °  Durable Medical Equipment  (From admission, onward)           Start     Ordered   08/29/24 1348  For home use only DME standard manual wheelchair with seat cushion  Once       Comments: Patient suffers from charcot foot and toe amputation on other foot which impairs their ability to perform daily activities like toileting in the home.  A walker will not resolve issue with performing activities of daily living. A wheelchair will allow patient to safely perform daily activities. Patient can safely propel the wheelchair in the home or has a caregiver who can provide assistance. Length of need 12 months . Accessories: elevating leg rests (ELRs), wheel locks, extensions and anti-tippers.   08/29/24 1348            "

## 2024-08-29 NOTE — Progress Notes (Signed)
 Patient refuses insulin  in PACU, says too much. Notified Dr. Leavy, says to hold and receive on the floor when back in room. Patient provided post op shoe per order.

## 2024-08-29 NOTE — Assessment & Plan Note (Signed)
 Hemoglobin 10.4

## 2024-08-29 NOTE — Progress Notes (Signed)
 [facility] [place], [place] 08/29/2024  Subjective:   Hospital day # 1  Patient known to our practice from outpatient follow-up.  Last seen by Dr. Marcelino on 07/18/2024. He has advanced chronic kidney disease due to type 1 diabetes, proteinuria, hypertension, anemia and secondary hyperparathyroidism He is admitted for chronic ulcer on the inner side of the left big toe.   Update Patient seen sitting at side of bed Family at bedside Procedure completed this morning Right great toe amputation at MPJ   Objective:  Vital signs in last 24 hours:  Temp:  [97.1 F (36.2 C)-98.6 F (37 C)] 98.3 F (36.8 C) (12/31 1139) Pulse Rate:  [64-71] 71 (12/31 1139) Resp:  [10-18] 18 (12/31 1139) BP: (105-145)/(61-91) 140/84 (12/31 1139) SpO2:  [98 %-100 %] 99 % (12/31 1139)  Weight change:  Filed Weights   08/28/24 0636  Weight: 98.4 kg    Intake/Output:    Intake/Output Summary (Last 24 hours) at 08/29/2024 1313 Last data filed at 08/29/2024 0900 Gross per 24 hour  Intake 440 ml  Output 5 ml  Net 435 ml     Physical Exam: General: No acute distress, laying in the bed  HEENT Anicteric, moist oral mucous membranes  Pulm/lungs Normal breathing effort  CVS/Heart Regular  Abdomen:  Soft, nontender, nondistended  Extremities: No peripheral edema  Neurologic: Alert, oriented  Skin: No acute rashes, Rt foot-surgical dressing  Access: Clotted left arm AV fistula       Basic Metabolic Panel:  Recent Labs  Lab 08/28/24 0847 08/29/24 0555  NA 130* 134*  K 5.0 5.3*  CL 96* 101  CO2 21* 21*  GLUCOSE 346* 186*  BUN 70* 72*  CREATININE 4.85* 4.70*  CALCIUM 9.1 9.3     CBC: Recent Labs  Lab 08/28/24 0847 08/29/24 0555  WBC 9.8 9.8  NEUTROABS 6.4  --   HGB 10.0* 10.4*  HCT 30.4* 32.1*  MCV 88.4 90.4  PLT 249 257      Lab Results  Component Value Date   HEPBSAG NON REACTIVE 02/17/2022      Microbiology:  Recent Results (from the past  240 hours)  Culture, blood (routine x 2)     Status: None (Preliminary result)   Collection Time: 08/28/24  8:45 AM   Specimen: BLOOD  Result Value Ref Range Status   Specimen Description BLOOD RIGHT ANTECUBITAL  Final   Special Requests   Final    BOTTLES DRAWN AEROBIC AND ANAEROBIC Blood Culture results may not be optimal due to an inadequate volume of blood received in culture bottles   Culture   Final    NO GROWTH < 24 HOURS Performed at [facility], [address] [place], [place] [address]    Report Status PENDING  Incomplete  Culture, blood (routine x 2)     Status: None (Preliminary result)   Collection Time: 08/28/24  8:55 AM   Specimen: BLOOD  Result Value Ref Range Status   Specimen Description BLOOD RIGHT HAND  Final   Special Requests   Final    BOTTLES DRAWN AEROBIC AND ANAEROBIC Blood Culture results may not be optimal due to an inadequate volume of blood received in culture bottles   Culture   Final    NO GROWTH < 24 HOURS Performed at [facility], [address] [place], [place] [address]    Report Status PENDING  Incomplete    Coagulation Studies: No results for input(s): LABPROT, INR in the  last 72 hours.  Urinalysis: No results for input(s): COLORURINE, LABSPEC, PHURINE, GLUCOSEU, HGBUR, BILIRUBINUR, KETONESUR, PROTEINUR, UROBILINOGEN, NITRITE, LEUKOCYTESUR in the last 72 hours.  Invalid input(s): APPERANCEUR    Imaging: US  ARTERIAL ABI (SCREENING LOWER EXTREMITY) Result Date: 08/29/2024 EXAM: VASCULAR SCREENING 08/28/2024 04:28:02 PM CLINICAL HISTORY: Peripheral artery disease. TECHNIQUE: Duplex ultrasound using B-mode/gray scaled imaging, Doppler spectral analysis and color flow Doppler was obtained of the carotid arteries and abdominal aorta. The ABIs were also acquired. COMPARISON: None available. FINDINGS: ANKLE BRACHIAL INDEX: Right: 1.23 Left: 1.23 ABIs were within normal range. Triphasic arterial  waveforms are noted distally in both lower extremities. IMPRESSION: 1. No evidence of hemodynamically significant lower extremity arterial occlusive disease at rest. Electronically signed by: Katheleen Faes MD 08/29/2024 07:32 AM EST RP Workstation: [id number]   MR FOOT RIGHT W WO CONTRAST Result Date: 08/28/2024 CLINICAL DATA:  Concern for osteomyelitis. Wound at the plantar great toe. EXAM: MRI OF THE RIGHT FOREFOOT WITHOUT AND WITH CONTRAST TECHNIQUE: Multiplanar, multisequence MR imaging of the right foot was performed before and after the administration of intravenous contrast. CONTRAST:  10mL GADAVIST  GADOBUTROL  1 MMOL/ML IV SOLN COMPARISON:  Earlier same day radiographs. FINDINGS: Bones/Joint/Cartilage Soft tissue wound at the medial plantar aspect of the great toe tracts to the level of the underlying interphalangeal joint of the great toe. Destructive and erosive changes about the interphalangeal joint of the great toe involving the head of the proximal phalanx and the base of the distal phalanx of the great toe with interphalangeal joint effusion. Marrow signal abnormality with enhancement of the great toe proximal and distal phalanges. These findings are compatible with septic arthritis of the interphalangeal joint of the great toe with osteomyelitis of the proximal and distal phalanges of the great toe. No additional acute osseous abnormality identified. The remainder of the bones demonstrate normal marrow signal intensity. Degenerative changes of the first metatarsal head-lateral hallux sesamoid articulation. Ligaments Collateral ligaments are intact.  Lisfranc ligament is intact. Muscles and Tendons No acute musculotendinous abnormality. Fatty atrophy of the intrinsic foot musculature likely reflects chronic denervation change. Soft tissue Soft tissue wound at the medial plantar aspect of the great toe with surrounding soft tissue edema and enhancement and cutaneous thickening. No loculated fluid  collection. IMPRESSION: 1. Septic arthritis of the interphalangeal joint of the great toe with osteomyelitis of the great toe proximal and distal phalanges demonstrating associated destructive and erosive changes. 2. No loculated fluid collection. 3. Fatty atrophy of the intrinsic foot musculature likely reflects chronic denervation change. Electronically Signed   By: Harrietta Sherry M.D.   On: 08/28/2024 12:11   DG Toe Great Right Result Date: 08/28/2024 EXAM: 1 VIEW(S) XRAY OF THE RIGHT TOES 08/28/2024 07:16:00 AM COMPARISON: None available. CLINICAL HISTORY: infection infection FINDINGS: BONES AND JOINTS: There are erosive changes present within the first interphalangeal joint, obliterating the head of the proximal phalanx and the base of the distal phalanx. There are also erosive changes within the tuft of the distal phalanx with pathological fracturing. Findings are consistent with septic arthritis and osteomyelitis. SOFT TISSUES: The soft tissues are unremarkable. IMPRESSION: 1. Erosive changes involving the first interphalangeal joint and distal phalanx tuft with pathologic fracture, consistent with septic arthritis and osteomyelitis. Electronically signed by: Timothy Berrigan MD 08/28/2024 07:45 AM EST RP Workstation: [id number]     Medications:      amLODipine   10 mg Oral QPM   calcitRIOL  0.25 mcg Oral Daily   carvedilol   6.25 mg Oral BID WC  cloNIDine   0.1 mg Oral BID   doxycycline   100 mg Oral Q12H   enoxaparin (LOVENOX) injection  30 mg Subcutaneous Q24H   insulin  aspart  0-5 Units Subcutaneous QHS   insulin  aspart  0-9 Units Subcutaneous TID WC   insulin  aspart  2 Units Subcutaneous TID WC   insulin  glargine  25 Units Subcutaneous BID   linagliptin  5 mg Oral Daily   methadone   100 mg Oral Daily   pantoprazole   40 mg Oral Daily   sodium bicarbonate   650 mg Oral BID   vancomycin  variable dose per unstable renal function (pharmacist dosing)   Does not apply See admin  instructions   acetaminophen  **OR** acetaminophen , HYDROcodone-acetaminophen , ondansetron  **OR** ondansetron  (ZOFRAN ) IV  Assessment/ Plan:  33 y.o. male with type 1 diabetes, diabetic nephropathy stage IV, Charcot foot arthropathy left foot and ankle, osteomyelitis right hallux, hepatitis C, hypertension  admitted on 08/28/2024 for Hyperglycemia [R73.9] Gastroesophageal reflux disease without esophagitis [K21.9] Toe osteomyelitis, right (HCC) [M86.9] Muscle spasms of neck [M62.838] Encounter for medication review [Z79.899] Septic arthritis of interphalangeal joint of toe of right foot (HCC) [M00.9] Osteomyelitis of right foot, unspecified type (HCC) [M86.9]  Diabetes type 1 with chronic kidney disease stage IV Hypertension Septic arthritis of toe of right foot, osteomyelitis of right foot Anemia in chronic kidney disease.  Plan: Patient with advanced chronic kidney disease due to type 1 diabetes. No indication for dialysis. Received Vancomycin  1g  prior to surgery Hgb 10.4, stable We will follow along and monitor volume status and renal function during hospitalization. Right great toe amputation at MPJ on 12/31    LOS: 1 [employer] 12/31/20251:13 PM  [facility] [place], [place] [phone]

## 2024-08-30 DIAGNOSIS — N179 Acute kidney failure, unspecified: Secondary | ICD-10-CM | POA: Diagnosis not present

## 2024-08-30 DIAGNOSIS — D638 Anemia in other chronic diseases classified elsewhere: Secondary | ICD-10-CM | POA: Diagnosis not present

## 2024-08-30 DIAGNOSIS — N189 Chronic kidney disease, unspecified: Secondary | ICD-10-CM | POA: Diagnosis not present

## 2024-08-30 DIAGNOSIS — E875 Hyperkalemia: Secondary | ICD-10-CM | POA: Diagnosis not present

## 2024-08-30 DIAGNOSIS — F1190 Opioid use, unspecified, uncomplicated: Secondary | ICD-10-CM | POA: Diagnosis not present

## 2024-08-30 DIAGNOSIS — E1065 Type 1 diabetes mellitus with hyperglycemia: Secondary | ICD-10-CM | POA: Diagnosis not present

## 2024-08-30 DIAGNOSIS — E871 Hypo-osmolality and hyponatremia: Secondary | ICD-10-CM | POA: Diagnosis not present

## 2024-08-30 DIAGNOSIS — E669 Obesity, unspecified: Secondary | ICD-10-CM | POA: Diagnosis not present

## 2024-08-30 DIAGNOSIS — M869 Osteomyelitis, unspecified: Secondary | ICD-10-CM | POA: Diagnosis not present

## 2024-08-30 DIAGNOSIS — I10 Essential (primary) hypertension: Secondary | ICD-10-CM | POA: Diagnosis not present

## 2024-08-30 LAB — CREATININE, SERUM
Creatinine, Ser: 4.49 mg/dL — ABNORMAL HIGH (ref 0.61–1.24)
GFR, Estimated: 17 mL/min — ABNORMAL LOW

## 2024-08-30 LAB — POTASSIUM: Potassium: 5 mmol/L (ref 3.5–5.1)

## 2024-08-30 LAB — GLUCOSE, CAPILLARY: Glucose-Capillary: 164 mg/dL — ABNORMAL HIGH (ref 70–99)

## 2024-08-30 MED ORDER — SODIUM ZIRCONIUM CYCLOSILICATE 10 G PO PACK: 10.0000 g | PACK | ORAL | 0 refills | Status: AC

## 2024-08-30 MED ORDER — HYDROCODONE-ACETAMINOPHEN 5-325 MG PO TABS: 1.0000 | ORAL_TABLET | Freq: Four times a day (QID) | ORAL | 0 refills | Status: AC | PRN

## 2024-08-30 MED ORDER — DOXYCYCLINE HYCLATE 100 MG PO TABS: 100.0000 mg | ORAL_TABLET | Freq: Two times a day (BID) | ORAL | 0 refills | Status: DC

## 2024-08-30 MED ORDER — INSULIN NPH (HUMAN) (ISOPHANE) 100 UNIT/ML ~~LOC~~ SUSP: 24.0000 [IU] | Freq: Two times a day (BID) | SUBCUTANEOUS | Status: AC

## 2024-08-30 NOTE — TOC CM/SW Note (Addendum)
 This CM reached out to [facility] with [employer] for status of wheelchair that was ordered 08/29/24  UPDATE 9:18am:  This CM spoke with patient for choice for Outpatient PT, patient verbalized [facility]. Referral faxed. Patient verbalized the wheelchair is scheduled to be delivered by 10am. Patient verbalized his mother will be transporting him home.

## 2024-08-30 NOTE — Anesthesia Postprocedure Evaluation (Signed)
"   Anesthesia Post Note  Patient: Treyvion Durkee  Procedure(s) Performed: RIGHT AMPUTATION, TOE (Right: Toe)  Patient location during evaluation: PACU Anesthesia Type: General Level of consciousness: awake and alert Pain management: pain level controlled Vital Signs Assessment: post-procedure vital signs reviewed and stable Respiratory status: spontaneous breathing, nonlabored ventilation, respiratory function stable and patient connected to nasal cannula oxygen Cardiovascular status: blood pressure returned to baseline and stable Postop Assessment: no apparent nausea or vomiting Anesthetic complications: no   No notable events documented.   Last Vitals:  Vitals:   08/30/24 0440 08/30/24 0724  BP: 137/79 133/82  Pulse: 65 68  Resp: 16 17  Temp: 36.8 C 36.5 C  SpO2: 99% 98%    Last Pain:  Vitals:   08/30/24 0758  TempSrc:   PainSc: 3                  Debby Mines      "

## 2024-08-30 NOTE — Plan of Care (Signed)
   Problem: Education: Goal: Ability to describe self-care measures that may prevent or decrease complications (Diabetes Survival Skills Education) will improve Outcome: Progressing

## 2024-08-30 NOTE — Care Plan (Signed)
 This RN returened methadone  (DOLOPHINE ) 10 MG/ML solution 100 mg back to pharmacy, signed the return with [facility]

## 2024-08-30 NOTE — Assessment & Plan Note (Signed)
 Acute kidney injury on CKD stage IV.  Creatinine 4.85 on presentation and down to 4.49 upon discharge with a GFR of 17.  Follow-up with nephrology as outpatient.

## 2024-08-30 NOTE — Discharge Summary (Signed)
 " Physician Discharge Summary   Patient: Jason Francis MRN: [id number] DOB: 1991/06/24  Admit date:     08/28/2024  Discharge date: 08/30/2024  Discharge Physician: Charlie Patterson   PCP: Liana Fish, NP   Recommendations at discharge:   Follow-up PCP 5 days Follow-up Dr. Malvin 1 week  Discharge Diagnoses: Principal Problem:   Osteomyelitis of great toe of right foot (HCC) Active Problems:   Insulin  dependent type 1 diabetes mellitus (HCC)   Acute kidney injury superimposed on CKD   CKD (chronic kidney disease), stage IV (HCC)   Opioid use disorder   Essential hypertension   Obesity (BMI 30-39.9)   Hyponatremia   Hyperkalemia   Anemia of chronic disease   Uncontrolled type 1 diabetes mellitus with hyperglycemia, with long-term current use of insulin  [facility])    Hospital Course: 34 y.o. male with medical history significant of IDDM, CKD stage IV, diabetic Charcot foot on the left side, hepatitis C, HTN, presented with worsening of right big toe infection.   Patient has a chronic ulcer on the inner side of left big toe, developed about 1 month ago, initially attributed to bunion problem.  She went to podiatry 1 week ago, podiatrist suspected osteomyelitis and recommended patient come to hospital for IV antibiotics.  Patient however declined and instead patient was started on doxycycline .  Patient however has been seen worsening of swelling and discharge from the wound however no much pain probably because he has significant diabetic neuropathy at the same time.  Denies any fever or chills.  Today he decided to come to the ED for worsening of infection.   ED Course: Afebrile, nontachycardic blood pressure 148/80 O2 saturation 100% on room air.  MRI positive for septic arthritis of the interphalangeal joint of the great toe with osteomyelitis.  Blood work showed WBC 9.0 hemoglobin 10.0 BUN 70 creatinine 4.8 compared to baseline 4.0-1.6 glucose 346 sodium 130 potassium  5.0.  12/31.  Patient with right first toe amputation today by Dr. Malvin. 08/30/2024.  Patient stable for discharge home on doxycycline  for another 5 days.  Follow-up with Dr. Malvin 1 week.  Leave dressing intact.  Will also prescribe Lokelma 3 times a week upon going home.  Assessment and Plan: * Osteomyelitis of great toe of right foot (HCC) Amputation of right great toe at MPJ level by Dr. Newton Abu on 12/31.  Only few pain pills prescribed.  Antibiotics switched over to doxycycline  for 5 days upon discharge.  PT and OT recommended outpatient PT.  Wheelchair ordered.  Blood cultures negative at the time of discharge.  Wound culture growing Staph aureus and sensitivities pending at the time of discharge.  Insulin  dependent type 1 diabetes mellitus (HCC) Uncontrolled with hyperglycemia.  Patient will go back to his usual regimen at home.  Acute kidney injury superimposed on CKD Acute kidney injury on CKD stage IV.  Creatinine 4.85 on presentation and down to 4.49 upon discharge with a GFR of 17.  Follow-up with nephrology as outpatient.  Opioid use disorder On chronic methadone   Essential hypertension On amlodipine  Coreg  and clonidine   Obesity (BMI 30-39.9) Class I with a BMI of 32.99  Anemia of chronic disease Hemoglobin 10.4  Hyperkalemia Given Lokelma while here.  Will use Lokelma 3 times a week upon discharge.  Hyponatremia Sodium only 1 point less than normal range.         Consultants: Podiatry, nephrology Procedures performed: Amputation of the first toe on the right foot Disposition: Home Diet recommendation:  Cardiac and Carb modified diet DISCHARGE MEDICATION: Allergies as of 08/30/2024       Reactions   Augmentin [amoxicillin-pot Clavulanate] Nausea And Vomiting   Sulfa Antibiotics Nausea And Vomiting        Medication List     STOP taking these medications    cyclobenzaprine  10 MG tablet Commonly known as: FLEXERIL    Iron  28 MG  Tabs       TAKE these medications    Accu-Chek Guide Me w/Device Kit USE AS DIRECTED   Accu-Chek Guide Test test strip Generic drug: glucose blood Use 1 test strip to check glucose 4 times daily and as needed for diabetes E11.65   Accu-Chek Softclix Lancets lancets Use 1 lancet to check glucose 4 times daily and as needed for diabetes E11.65   acetaminophen  500 MG tablet Commonly known as: TYLENOL  Take 1,000 mg by mouth every 6 (six) hours as needed.   amLODipine  10 MG tablet Commonly known as: NORVASC  Take 10 mg by mouth every evening.   calcitRIOL 0.25 MCG capsule Commonly known as: ROCALTROL Take 0.25 mcg by mouth daily.   carvedilol  6.25 MG tablet Commonly known as: COREG  Take 6.25 mg by mouth 2 (two) times daily with a meal.   cloNIDine  0.1 MG tablet Commonly known as: CATAPRES  Take 0.1 mg by mouth 2 (two) times daily.   doxycycline  100 MG tablet Commonly known as: VIBRA -TABS Take 1 tablet (100 mg total) by mouth every 12 (twelve) hours for 5 days. What changed: when to take this   HYDROcodone-acetaminophen  5-325 MG tablet Commonly known as: NORCO/VICODIN Take 1 tablet by mouth every 6 (six) hours as needed for severe pain (pain score 7-10).   insulin  NPH Human 100 UNIT/ML injection Commonly known as: NovoLIN  N Inject 0.24 mLs (24 Units total) into the skin 2 (two) times daily before a meal.   insulin  regular 100 units/mL injection Commonly known as: HumuLIN R  INJECT INTO SKIN BEFORE MEALS ACCORDING TO SLIDING SCALE - MAX DOSE 48 UNITS IN 24 HOURS   methadone  1 MG/1ML solution Commonly known as: DOLOPHINE  Take 100 mg by mouth daily. Methadone  clinic   metoCLOPramide  10 MG tablet Commonly known as: REGLAN  TAKE 1 TABLET BY MOUTH TWICE DAILY AS NEEDED FOR NAUSEA OR VOMITING   pantoprazole  40 MG tablet Commonly known as: PROTONIX  Take 1 tablet (40 mg total) by mouth daily.   sodium bicarbonate  650 MG tablet Take 650 mg by mouth 2 (two) times  daily.   sodium zirconium cyclosilicate 10 g Pack packet Commonly known as: LOKELMA Take 10 g by mouth 3 (three) times a week. Start taking on: August 31, 2024        Follow-up Information     Abernathy, Mardy, NP Follow up in 5 day(s).   Specialty: Nurse Practitioner Contact information: [address] [place] [address] [phone]         Malvin Marsa FALCON, DPM Follow up in 1 week(s).   Specialty: Podiatry Contact information: [address] [place] [address] [phone]                Discharge Exam: Fredricka Weights   08/28/24 0636  Weight: 98.4 kg   Physical Exam HENT:     Head: Normocephalic.     Mouth/Throat:     Pharynx: No oropharyngeal exudate.  Eyes:     General: Lids are normal.     Conjunctiva/sclera: Conjunctivae normal.  Cardiovascular:     Rate and Rhythm: Normal rate  and regular rhythm.     Heart sounds: Normal heart sounds, S1 normal and S2 normal.  Pulmonary:     Breath sounds: No decreased breath sounds, wheezing, rhonchi or rales.  Abdominal:     Palpations: Abdomen is soft.     Tenderness: There is no abdominal tenderness.  Musculoskeletal:     Right lower leg: No swelling.     Left lower leg: No swelling.  Skin:    General: Skin is warm.     Findings: No rash.  Neurological:     Mental Status: He is alert and oriented to person, place, and time.      Condition at discharge: stable  The results of significant diagnostics from this hospitalization (including imaging, microbiology, ancillary and laboratory) are listed below for reference.   Imaging Studies: DG Foot 2 Views Right Result Date: 08/29/2024 CLINICAL DATA:  Postop. EXAM: DG FOOT 2V*R* COMPARISON:  Preoperative imaging FINDINGS: Interval resection of the great toe. First metatarsal head resection scratch at the first metatarsal head is smooth. Expected postoperative changes in the soft tissues. No acute fracture or bony  destructive change. A dressing overlies the foot. IMPRESSION: Interval resection of the great toe. Electronically Signed   By: Andrea Gasman M.D.   On: 08/29/2024 16:10   US  ARTERIAL ABI (SCREENING LOWER EXTREMITY) Result Date: 08/29/2024 EXAM: VASCULAR SCREENING 08/28/2024 04:28:02 PM CLINICAL HISTORY: Peripheral artery disease. TECHNIQUE: Duplex ultrasound using B-mode/gray scaled imaging, Doppler spectral analysis and color flow Doppler was obtained of the carotid arteries and abdominal aorta. The ABIs were also acquired. COMPARISON: None available. FINDINGS: ANKLE BRACHIAL INDEX: Right: 1.23 Left: 1.23 ABIs were within normal range. Triphasic arterial waveforms are noted distally in both lower extremities. IMPRESSION: 1. No evidence of hemodynamically significant lower extremity arterial occlusive disease at rest. Electronically signed by: Katheleen Faes MD 08/29/2024 07:32 AM EST RP Workstation: [id number]   MR FOOT RIGHT W WO CONTRAST Result Date: 08/28/2024 CLINICAL DATA:  Concern for osteomyelitis. Wound at the plantar great toe. EXAM: MRI OF THE RIGHT FOREFOOT WITHOUT AND WITH CONTRAST TECHNIQUE: Multiplanar, multisequence MR imaging of the right foot was performed before and after the administration of intravenous contrast. CONTRAST:  10mL GADAVIST  GADOBUTROL  1 MMOL/ML IV SOLN COMPARISON:  Earlier same day radiographs. FINDINGS: Bones/Joint/Cartilage Soft tissue wound at the medial plantar aspect of the great toe tracts to the level of the underlying interphalangeal joint of the great toe. Destructive and erosive changes about the interphalangeal joint of the great toe involving the head of the proximal phalanx and the base of the distal phalanx of the great toe with interphalangeal joint effusion. Marrow signal abnormality with enhancement of the great toe proximal and distal phalanges. These findings are compatible with septic arthritis of the interphalangeal joint of the great toe with  osteomyelitis of the proximal and distal phalanges of the great toe. No additional acute osseous abnormality identified. The remainder of the bones demonstrate normal marrow signal intensity. Degenerative changes of the first metatarsal head-lateral hallux sesamoid articulation. Ligaments Collateral ligaments are intact.  Lisfranc ligament is intact. Muscles and Tendons No acute musculotendinous abnormality. Fatty atrophy of the intrinsic foot musculature likely reflects chronic denervation change. Soft tissue Soft tissue wound at the medial plantar aspect of the great toe with surrounding soft tissue edema and enhancement and cutaneous thickening. No loculated fluid collection. IMPRESSION: 1. Septic arthritis of the interphalangeal joint of the great toe with osteomyelitis of the great toe proximal and distal phalanges demonstrating associated destructive  and erosive changes. 2. No loculated fluid collection. 3. Fatty atrophy of the intrinsic foot musculature likely reflects chronic denervation change. Electronically Signed   By: Harrietta Sherry M.D.   On: 08/28/2024 12:11   DG Toe Great Right Result Date: 08/28/2024 EXAM: 1 VIEW(S) XRAY OF THE RIGHT TOES 08/28/2024 07:16:00 AM COMPARISON: None available. CLINICAL HISTORY: infection infection FINDINGS: BONES AND JOINTS: There are erosive changes present within the first interphalangeal joint, obliterating the head of the proximal phalanx and the base of the distal phalanx. There are also erosive changes within the tuft of the distal phalanx with pathological fracturing. Findings are consistent with septic arthritis and osteomyelitis. SOFT TISSUES: The soft tissues are unremarkable. IMPRESSION: 1. Erosive changes involving the first interphalangeal joint and distal phalanx tuft with pathologic fracture, consistent with septic arthritis and osteomyelitis. Electronically signed by: Evalene Coho MD 08/28/2024 07:45 AM EST RP Workstation: [id number]   DG Foot  Complete Left Result Date: 08/20/2024 Please see detailed radiograph report in office note.  MR ANKLE LEFT WO CONTRAST Result Date: 08/13/2024 CLINICAL DATA:  Left ankle swelling, chronic deterioration EXAM: MRI OF THE LEFT ANKLE WITHOUT CONTRAST TECHNIQUE: Multiplanar, multisequence MR imaging of the ankle was performed. No intravenous contrast was administered. COMPARISON:  Ankle x-ray 07/10/2024 FINDINGS: Bones/Joint/Cartilage Diffuse advanced arthropathy of the ankle in foot with fragmentation of the midfoot and resorption of the distal tibia and fibula consistent with neuropathic arthropathy. Mild marrow edema in the distal tibia and fibula which may be reactive versus secondary to osteomyelitis. Large amount of complex fluid in the ankle joint with severe synovitis. Muscles and Tendons Mild muscle atrophy. Flexor, extensor, peroneal and Achilles tendons are intact. Soft tissue No fluid collection or hematoma.  No soft tissue mass. IMPRESSION: 1. Diffuse advanced arthropathy of the ankle in foot with fragmentation of the midfoot and resorption of the distal tibia and fibula consistent with neuropathic arthropathy. Mild marrow edema in the distal tibia and fibula which may be reactive versus secondary to osteomyelitis. 2. Large amount of complex fluid in the ankle joint with severe synovitis which may be inflammatory, but infectious etiology is not excluded. Recommend fluid sampling for further evaluation. Electronically Signed   By: Julaine Blanch M.D.   On: 08/13/2024 14:05    Microbiology: Results for orders placed or performed during the hospital encounter of 08/28/24  Culture, blood (routine x 2)     Status: None (Preliminary result)   Collection Time: 08/28/24  8:45 AM   Specimen: BLOOD  Result Value Ref Range Status   Specimen Description BLOOD RIGHT ANTECUBITAL  Final   Special Requests   Final    BOTTLES DRAWN AEROBIC AND ANAEROBIC Blood Culture results may not be optimal due to an  inadequate volume of blood received in culture bottles   Culture   Final    NO GROWTH 2 DAYS Performed at [facility], [address] [place], [place] [address]    Report Status PENDING  Incomplete  Culture, blood (routine x 2)     Status: None (Preliminary result)   Collection Time: 08/28/24  8:55 AM   Specimen: BLOOD  Result Value Ref Range Status   Specimen Description BLOOD RIGHT HAND  Final   Special Requests   Final    BOTTLES DRAWN AEROBIC AND ANAEROBIC Blood Culture results may not be optimal due to an inadequate volume of blood received in culture bottles   Culture   Final    NO GROWTH 2 DAYS Performed at  [facility], [address] [place], [place] [address]    Report Status PENDING  Incomplete  Aerobic/Anaerobic Culture w Gram Stain (surgical/deep wound)     Status: None (Preliminary result)   Collection Time: 08/29/24  7:45 AM   Specimen: Wound; Tissue  Result Value Ref Range Status   Specimen Description   Final    WOUND Performed at [facility], [address] [place], [place] [address]    Special Requests   Final    RT GREAT TOE AMPUTATION Performed at [facility], [address] [place], [place] [address]    Gram Stain NO WBC SEEN FEW GRAM POSITIVE COCCI   Final   Culture   Final    ABUNDANT STAPHYLOCOCCUS AUREUS SUSCEPTIBILITIES TO FOLLOW Performed at [facility], [address], [place], [place] [address]    Report Status PENDING  Incomplete    Labs: CBC: Recent Labs  Lab 08/28/24 0847 08/29/24 0555  WBC 9.8 9.8  NEUTROABS 6.4  --   HGB 10.0* 10.4*  HCT 30.4* 32.1*  MCV 88.4 90.4  PLT 249 257   Basic Metabolic Panel: Recent Labs  Lab 08/28/24 0847 08/29/24 0555 08/30/24 0550  NA 130* 134*  --   K 5.0 5.3* 5.0  CL 96* 101  --   CO2 21* 21*  --   GLUCOSE 346* 186*  --   BUN 70* 72*  --   CREATININE 4.85* 4.70* 4.49*  CALCIUM 9.1 9.3  --    Liver Function Tests: Recent Labs   Lab 08/28/24 0847  AST 32  ALT 30  ALKPHOS 148*  BILITOT 0.4  PROT 8.1  ALBUMIN 3.6   CBG: Recent Labs  Lab 08/29/24 0802 08/29/24 1132 08/29/24 1654 08/29/24 2111 08/30/24 0722  GLUCAP 222* 320* 219* 219* 164*    Discharge time spent: greater than 30 minutes.  Signed: Charlie Patterson, MD [facility] 08/30/2024 "

## 2024-08-30 NOTE — Care Plan (Signed)
 Dc instuctions given to patient and family at bedside IV removed All questions answered, all concerns addressed DME at bedside to be picked up by pt at discharge time  Awaiting ADAPT

## 2024-08-31 ENCOUNTER — Encounter: Payer: Self-pay | Admitting: Podiatry

## 2024-08-31 ENCOUNTER — Other Ambulatory Visit: Payer: Self-pay | Admitting: Podiatry

## 2024-08-31 LAB — SURGICAL PATHOLOGY

## 2024-08-31 MED ORDER — CLINDAMYCIN HCL 300 MG PO CAPS: 300.0000 mg | ORAL_CAPSULE | Freq: Three times a day (TID) | ORAL | 0 refills | Status: AC

## 2024-08-31 NOTE — Progress Notes (Signed)
 Spoke with pharmacist at [facility] and Dr Malvin already changed the antibiotic to clindamycin based on sensitivites of the MRSA.  Dr Josette

## 2024-08-31 NOTE — Progress Notes (Signed)
 Rx for doxy canceled and Rx for clindamycin sent based on cultures

## 2024-09-02 LAB — CULTURE, BLOOD (ROUTINE X 2)
Culture: NO GROWTH
Culture: NO GROWTH

## 2024-09-05 LAB — AEROBIC/ANAEROBIC CULTURE W GRAM STAIN (SURGICAL/DEEP WOUND): Gram Stain: NONE SEEN

## 2024-09-06 ENCOUNTER — Ambulatory Visit (INDEPENDENT_AMBULATORY_CARE_PROVIDER_SITE_OTHER): Payer: MEDICAID | Admitting: Podiatry

## 2024-09-06 ENCOUNTER — Encounter: Payer: MEDICAID | Admitting: Podiatry

## 2024-09-06 DIAGNOSIS — E10628 Type 1 diabetes mellitus with other skin complications: Secondary | ICD-10-CM | POA: Diagnosis not present

## 2024-09-06 DIAGNOSIS — L089 Local infection of the skin and subcutaneous tissue, unspecified: Secondary | ICD-10-CM | POA: Diagnosis not present

## 2024-09-06 DIAGNOSIS — M869 Osteomyelitis, unspecified: Secondary | ICD-10-CM

## 2024-09-06 NOTE — Progress Notes (Signed)
"  °  Subjective:  Patient ID: Jason Francis, male    DOB: 11-28-1990,  MRN: [id number]  Chief Complaint  Patient presents with   Routine Post Op    Right toe amputation. 3 pain. IDDM A1C 7.8. Taking Clindamycin .     DOS: 08/29/24 Procedure: 1. Amputation right great toe at MPJ level   34 y.o. male seen for post op check.  Patient reports he has done well since surgery.  Has left dressing clean dry and intact as instructed.  States pain is minimal taking pain pill only at night for helping with sleep.  Almost done with his clindamycin .  Review of Systems: Negative except as noted in the HPI. Denies N/V/F/Ch.   Objective:   Constitutional Well developed. Well nourished.  Vascular Foot warm and well perfused. Capillary refill normal to all digits.   No calf pain with palpation  Neurologic Normal speech. Oriented to person, place, and time. Epicritic sensation diminished to right foot  Dermatologic Amputation site well coapted no drainage no dehiscence no evidence of infection looking very good   Orthopedic: Status post right great toe amputation MPJ level   Radiographs: Status post amputation MPJ level  Pathology: 1. Toe(s), amputation, right great :       -  ULCERATED SKIN WITH GRANULATION TISSUE, SCAR AND ACUTE INFLAMMATION WITH       UNDERLYING BONE SHOWING EXTENSIVE BONY REMODELING WITH BONE MARROW FIBROSIS/.       -  SKIN, SOFT TISSUE AND BONE/ARTICULAR MARGINS NEGATIVE FOR ACUTE INFLAMMATION.   Micro: ABUNDANT METHICILLIN RESISTANT STAPHYLOCOCCUS AUREUS  FEW ANAEROBIC GRAM POSITIVE COCCI   Assessment:   1. Osteomyelitis of great toe of right foot (HCC)   2. Type 1 diabetes mellitus with diabetic foot infection (HCC)     Plan:  Patient was evaluated and treated and all questions answered.  POD # 8 s/p right great toe amputation at MPJ level -Progressing as expected postoperatively amputation site looking very good on dressing change today no evidence of infection or  dehiscence -XR: Deferred today -WB Status: Weightbearing as tolerated in postop shoe -Sutures: Remain intact another 2 weeks will take out at next appointment. -Medications/ABX: Okay to discontinue clindamycin  no further antibiotics indicated -Dressing: Reapplied a dry gauze dressing.  Change in 1 week and then leave on until the following appointment - F/u Plan: Follow-up in 2 weeks for suture removal        Jason Francis, DPM [facility] "

## 2024-09-19 ENCOUNTER — Telehealth: Payer: Self-pay | Admitting: Nurse Practitioner

## 2024-09-19 NOTE — Telephone Encounter (Signed)
 Endocrinology appointment 10/05/2024 with [facility]

## 2024-09-20 ENCOUNTER — Ambulatory Visit: Payer: MEDICAID | Admitting: Podiatry

## 2024-09-20 ENCOUNTER — Encounter: Payer: Self-pay | Admitting: Podiatry

## 2024-09-20 DIAGNOSIS — L089 Local infection of the skin and subcutaneous tissue, unspecified: Secondary | ICD-10-CM | POA: Diagnosis not present

## 2024-09-20 DIAGNOSIS — Z9889 Other specified postprocedural states: Secondary | ICD-10-CM | POA: Diagnosis not present

## 2024-09-20 DIAGNOSIS — E10628 Type 1 diabetes mellitus with other skin complications: Secondary | ICD-10-CM

## 2024-09-20 DIAGNOSIS — M869 Osteomyelitis, unspecified: Secondary | ICD-10-CM | POA: Diagnosis not present

## 2024-09-20 NOTE — Progress Notes (Signed)
"  °  Subjective:  Patient ID: Jason Francis, male    DOB: 08/30/1991,  MRN: [id number]  No chief complaint on file.   DOS: 08/29/24 Procedure: 1. Amputation right great toe at MPJ level   34 y.o. male seen for post op check.   Patient is about 3 weeks out from surgery.  He is doing very well walking postop shoe.  Says pain is mostly controlled.  Review of Systems: Negative except as noted in the HPI. Denies N/V/F/Ch.   Objective:   Constitutional Well developed. Well nourished.  Vascular Foot warm and well perfused. Capillary refill normal to all digits.   No calf pain with palpation  Neurologic Normal speech. Oriented to person, place, and time. Epicritic sensation diminished to right foot  Dermatologic Amputation site well coapted no drainage no dehiscence no evidence of infection looking very good improved from prior   Orthopedic: Status post right great toe amputation MPJ level   Radiographs: Status post amputation MPJ level  Pathology: 1. Toe(s), amputation, right great :       -  ULCERATED SKIN WITH GRANULATION TISSUE, SCAR AND ACUTE INFLAMMATION WITH       UNDERLYING BONE SHOWING EXTENSIVE BONY REMODELING WITH BONE MARROW FIBROSIS/.       -  SKIN, SOFT TISSUE AND BONE/ARTICULAR MARGINS NEGATIVE FOR ACUTE INFLAMMATION.   Micro: ABUNDANT METHICILLIN RESISTANT STAPHYLOCOCCUS AUREUS  FEW ANAEROBIC GRAM POSITIVE COCCI   Assessment:   1. Osteomyelitis of great toe of right foot (HCC)   2. Type 1 diabetes mellitus with diabetic foot infection (HCC)   3. Post-operative state      Plan:  Patient was evaluated and treated and all questions answered.  3 weeks s/p right great toe amputation at MPJ level -Progressing as expected postoperatively amputation site with healed at this time -XR: Deferred today -WB Status: Weightbearing as tolerated in postop shoe okay to transition back to regular shoe gear -Sutures: Removed today -Medications/ABX: No antibiotics  indicated -Dressing: Okay to wash the foot at this point in time can apply Band-Aid style dressing for another week as needed -Discussed from my standpoint he is okay to proceed with amputation of left side in approximately 1 to 2 weeks with Dr. Marea. - F/u Plan: Follow-up as needed with me.  Discussed that if he has a callus to reform on the right foot we are happy to trim that and he just needs to call to set that appointment up.  Also instructed him that if anything opens up on the right foot amputation site or he notices redness or drainage to call and we will get him back in.        Marolyn JULIANNA Honour, DPM [facility] "

## 2024-11-09 ENCOUNTER — Ambulatory Visit: Payer: MEDICAID | Admitting: Nurse Practitioner
